# Patient Record
Sex: Female | Born: 1950
Health system: Southern US, Community
[De-identification: ages and names within clinical notes are randomized; demographics above are authoritative.]

## PROBLEM LIST (undated history)

## (undated) DIAGNOSIS — I7121 Aneurysm of the ascending aorta, without rupture: Secondary | ICD-10-CM

## (undated) DIAGNOSIS — F329 Major depressive disorder, single episode, unspecified: Secondary | ICD-10-CM

## (undated) DIAGNOSIS — I700 Atherosclerosis of aorta: Secondary | ICD-10-CM

## (undated) DIAGNOSIS — K259 Gastric ulcer, unspecified as acute or chronic, without hemorrhage or perforation: Secondary | ICD-10-CM

## (undated) DIAGNOSIS — M1990 Unspecified osteoarthritis, unspecified site: Secondary | ICD-10-CM

## (undated) DIAGNOSIS — K219 Gastro-esophageal reflux disease without esophagitis: Secondary | ICD-10-CM

## (undated) DIAGNOSIS — F419 Anxiety disorder, unspecified: Secondary | ICD-10-CM

## (undated) DIAGNOSIS — E785 Hyperlipidemia, unspecified: Secondary | ICD-10-CM

## (undated) DIAGNOSIS — M797 Fibromyalgia: Secondary | ICD-10-CM

## (undated) DIAGNOSIS — W57XXXA Bitten or stung by nonvenomous insect and other nonvenomous arthropods, initial encounter: Secondary | ICD-10-CM

## (undated) DIAGNOSIS — K623 Rectal prolapse: Secondary | ICD-10-CM

## (undated) DIAGNOSIS — D649 Anemia, unspecified: Secondary | ICD-10-CM

## (undated) DIAGNOSIS — T7840XA Allergy, unspecified, initial encounter: Secondary | ICD-10-CM

## (undated) DIAGNOSIS — Z800 Family history of malignant neoplasm of digestive organs: Secondary | ICD-10-CM

## (undated) DIAGNOSIS — K589 Irritable bowel syndrome without diarrhea: Secondary | ICD-10-CM

## (undated) DIAGNOSIS — F32A Depression, unspecified: Secondary | ICD-10-CM

## (undated) DIAGNOSIS — G4733 Obstructive sleep apnea (adult) (pediatric): Secondary | ICD-10-CM

## (undated) DIAGNOSIS — H269 Unspecified cataract: Secondary | ICD-10-CM

## (undated) HISTORY — DX: Depression, unspecified: F32.A

## (undated) HISTORY — DX: Unspecified cataract: H26.9

## (undated) HISTORY — DX: Gastro-esophageal reflux disease without esophagitis: K21.9

## (undated) HISTORY — DX: Irritable bowel syndrome, unspecified: K58.9

## (undated) HISTORY — DX: Anemia, unspecified: D64.9

## (undated) HISTORY — PX: LIPOMA EXCISION: SHX5283

## (undated) HISTORY — DX: Allergy, unspecified, initial encounter: T78.40XA

## (undated) HISTORY — DX: Major depressive disorder, single episode, unspecified: F32.9

## (undated) HISTORY — PX: OTHER SURGICAL HISTORY: SHX169

## (undated) HISTORY — PX: TONSILLECTOMY: SUR1361

## (undated) HISTORY — DX: Bitten or stung by nonvenomous insect and other nonvenomous arthropods, initial encounter: W57.XXXA

## (undated) HISTORY — DX: Hyperlipidemia, unspecified: E78.5

## (undated) HISTORY — PX: CHOLECYSTECTOMY: SHX55

## (undated) HISTORY — DX: Anxiety disorder, unspecified: F41.9

## (undated) HISTORY — DX: Unspecified osteoarthritis, unspecified site: M19.90

## (undated) HISTORY — DX: Gastric ulcer, unspecified as acute or chronic, without hemorrhage or perforation: K25.9

## (undated) HISTORY — DX: Fibromyalgia: M79.7

## (undated) HISTORY — DX: Obstructive sleep apnea (adult) (pediatric): G47.33

## (undated) HISTORY — DX: Atherosclerosis of aorta: I70.0

## (undated) HISTORY — DX: Aneurysm of the ascending aorta, without rupture: I71.21

## (undated) HISTORY — DX: Rectal prolapse: K62.3

## (undated) HISTORY — DX: Family history of malignant neoplasm of digestive organs: Z80.0

## (undated) HISTORY — PX: APPENDECTOMY: SHX54

---

## 1998-06-25 ENCOUNTER — Other Ambulatory Visit: Admission: RE | Admit: 1998-06-25 | Discharge: 1998-06-25 | Payer: Self-pay | Admitting: Obstetrics and Gynecology

## 1998-10-01 ENCOUNTER — Ambulatory Visit: Admission: RE | Admit: 1998-10-01 | Discharge: 1998-10-01 | Payer: Self-pay | Admitting: Unknown Physician Specialty

## 1999-09-28 ENCOUNTER — Other Ambulatory Visit: Admission: RE | Admit: 1999-09-28 | Discharge: 1999-09-28 | Payer: Self-pay | Admitting: Obstetrics and Gynecology

## 2000-07-05 ENCOUNTER — Other Ambulatory Visit: Admission: RE | Admit: 2000-07-05 | Discharge: 2000-07-05 | Payer: Self-pay | Admitting: Gynecology

## 2000-07-05 ENCOUNTER — Encounter (INDEPENDENT_AMBULATORY_CARE_PROVIDER_SITE_OTHER): Payer: Self-pay

## 2001-10-10 ENCOUNTER — Other Ambulatory Visit: Admission: RE | Admit: 2001-10-10 | Discharge: 2001-10-10 | Payer: Self-pay | Admitting: Obstetrics and Gynecology

## 2002-05-14 ENCOUNTER — Encounter: Payer: Self-pay | Admitting: Obstetrics and Gynecology

## 2002-05-14 ENCOUNTER — Encounter: Admission: RE | Admit: 2002-05-14 | Discharge: 2002-05-14 | Payer: Self-pay | Admitting: Obstetrics and Gynecology

## 2002-11-25 ENCOUNTER — Other Ambulatory Visit: Admission: RE | Admit: 2002-11-25 | Discharge: 2002-11-25 | Payer: Self-pay | Admitting: Obstetrics and Gynecology

## 2003-01-14 ENCOUNTER — Encounter: Admission: RE | Admit: 2003-01-14 | Discharge: 2003-01-14 | Payer: Self-pay | Admitting: Obstetrics and Gynecology

## 2003-01-14 ENCOUNTER — Encounter: Payer: Self-pay | Admitting: Obstetrics and Gynecology

## 2004-04-28 ENCOUNTER — Other Ambulatory Visit: Admission: RE | Admit: 2004-04-28 | Discharge: 2004-04-28 | Payer: Self-pay | Admitting: Obstetrics and Gynecology

## 2005-08-30 ENCOUNTER — Other Ambulatory Visit: Admission: RE | Admit: 2005-08-30 | Discharge: 2005-08-30 | Payer: Self-pay | Admitting: Obstetrics and Gynecology

## 2007-09-17 ENCOUNTER — Encounter: Admission: RE | Admit: 2007-09-17 | Discharge: 2007-09-17 | Payer: Self-pay | Admitting: Specialist

## 2011-09-13 DIAGNOSIS — IMO0002 Reserved for concepts with insufficient information to code with codable children: Secondary | ICD-10-CM | POA: Insufficient documentation

## 2011-09-13 DIAGNOSIS — H5200 Hypermetropia, unspecified eye: Secondary | ICD-10-CM | POA: Insufficient documentation

## 2014-04-18 HISTORY — PX: COLONOSCOPY: SHX174

## 2015-06-22 ENCOUNTER — Other Ambulatory Visit: Payer: Self-pay | Admitting: Internal Medicine

## 2015-06-22 DIAGNOSIS — R1030 Lower abdominal pain, unspecified: Secondary | ICD-10-CM

## 2015-06-22 DIAGNOSIS — N390 Urinary tract infection, site not specified: Secondary | ICD-10-CM

## 2016-01-05 DIAGNOSIS — E785 Hyperlipidemia, unspecified: Secondary | ICD-10-CM | POA: Diagnosis not present

## 2016-01-05 DIAGNOSIS — E559 Vitamin D deficiency, unspecified: Secondary | ICD-10-CM | POA: Diagnosis not present

## 2016-01-05 DIAGNOSIS — Z23 Encounter for immunization: Secondary | ICD-10-CM | POA: Diagnosis not present

## 2016-01-05 DIAGNOSIS — Z0001 Encounter for general adult medical examination with abnormal findings: Secondary | ICD-10-CM | POA: Diagnosis not present

## 2016-01-05 DIAGNOSIS — R252 Cramp and spasm: Secondary | ICD-10-CM | POA: Diagnosis not present

## 2016-01-05 DIAGNOSIS — E538 Deficiency of other specified B group vitamins: Secondary | ICD-10-CM | POA: Diagnosis not present

## 2016-01-05 DIAGNOSIS — N390 Urinary tract infection, site not specified: Secondary | ICD-10-CM | POA: Diagnosis not present

## 2016-01-05 DIAGNOSIS — D509 Iron deficiency anemia, unspecified: Secondary | ICD-10-CM | POA: Diagnosis not present

## 2016-04-11 DIAGNOSIS — E559 Vitamin D deficiency, unspecified: Secondary | ICD-10-CM | POA: Diagnosis not present

## 2016-04-11 DIAGNOSIS — E611 Iron deficiency: Secondary | ICD-10-CM | POA: Diagnosis not present

## 2016-04-14 HISTORY — PX: ESOPHAGOGASTRODUODENOSCOPY: SHX1529

## 2016-05-16 DIAGNOSIS — Z124 Encounter for screening for malignant neoplasm of cervix: Secondary | ICD-10-CM | POA: Diagnosis not present

## 2016-05-16 DIAGNOSIS — Z6835 Body mass index (BMI) 35.0-35.9, adult: Secondary | ICD-10-CM | POA: Diagnosis not present

## 2016-05-16 DIAGNOSIS — Z01419 Encounter for gynecological examination (general) (routine) without abnormal findings: Secondary | ICD-10-CM | POA: Diagnosis not present

## 2016-07-04 DIAGNOSIS — S90851A Superficial foreign body, right foot, initial encounter: Secondary | ICD-10-CM | POA: Diagnosis not present

## 2016-07-05 DIAGNOSIS — M795 Residual foreign body in soft tissue: Secondary | ICD-10-CM | POA: Diagnosis not present

## 2016-07-06 DIAGNOSIS — S91341A Puncture wound with foreign body, right foot, initial encounter: Secondary | ICD-10-CM | POA: Diagnosis not present

## 2016-07-06 DIAGNOSIS — M795 Residual foreign body in soft tissue: Secondary | ICD-10-CM | POA: Diagnosis not present

## 2016-07-06 DIAGNOSIS — S90851A Superficial foreign body, right foot, initial encounter: Secondary | ICD-10-CM | POA: Diagnosis not present

## 2016-07-06 DIAGNOSIS — G4733 Obstructive sleep apnea (adult) (pediatric): Secondary | ICD-10-CM | POA: Diagnosis not present

## 2016-07-27 DIAGNOSIS — Z1231 Encounter for screening mammogram for malignant neoplasm of breast: Secondary | ICD-10-CM | POA: Diagnosis not present

## 2017-12-03 ENCOUNTER — Ambulatory Visit (INDEPENDENT_AMBULATORY_CARE_PROVIDER_SITE_OTHER): Payer: Medicare Other

## 2017-12-03 ENCOUNTER — Ambulatory Visit: Payer: Medicare Other | Admitting: Podiatry

## 2017-12-03 ENCOUNTER — Encounter (INDEPENDENT_AMBULATORY_CARE_PROVIDER_SITE_OTHER): Payer: Self-pay

## 2017-12-03 ENCOUNTER — Other Ambulatory Visit: Payer: Self-pay | Admitting: Podiatry

## 2017-12-03 DIAGNOSIS — M2041 Other hammer toe(s) (acquired), right foot: Secondary | ICD-10-CM

## 2017-12-03 DIAGNOSIS — M79672 Pain in left foot: Secondary | ICD-10-CM | POA: Diagnosis not present

## 2017-12-03 DIAGNOSIS — S99922S Unspecified injury of left foot, sequela: Secondary | ICD-10-CM

## 2017-12-03 DIAGNOSIS — L989 Disorder of the skin and subcutaneous tissue, unspecified: Secondary | ICD-10-CM

## 2017-12-03 DIAGNOSIS — M79671 Pain in right foot: Secondary | ICD-10-CM | POA: Diagnosis not present

## 2017-12-03 DIAGNOSIS — S92502A Displaced unspecified fracture of left lesser toe(s), initial encounter for closed fracture: Secondary | ICD-10-CM

## 2017-12-03 DIAGNOSIS — Q828 Other specified congenital malformations of skin: Secondary | ICD-10-CM | POA: Diagnosis not present

## 2017-12-03 DIAGNOSIS — L923 Foreign body granuloma of the skin and subcutaneous tissue: Secondary | ICD-10-CM

## 2017-12-03 DIAGNOSIS — M7750 Other enthesopathy of unspecified foot: Secondary | ICD-10-CM | POA: Diagnosis not present

## 2017-12-03 DIAGNOSIS — M779 Enthesopathy, unspecified: Secondary | ICD-10-CM

## 2017-12-03 DIAGNOSIS — D2371 Other benign neoplasm of skin of right lower limb, including hip: Secondary | ICD-10-CM

## 2017-12-03 DIAGNOSIS — M2042 Other hammer toe(s) (acquired), left foot: Secondary | ICD-10-CM

## 2017-12-03 NOTE — Progress Notes (Signed)
   Subjective:    Patient ID: Patricia Leonard, female    DOB: 1951-09-20, 67 y.o.   MRN: [id number]   Chief Complaint  Patient presents with  . foreign object    R plantar (stepped on glass) x 1 year Pt. stated," it's still bothering me. I had surgery 1 year ago to remove the glass and it feels like I still have somthing there."  . Toe Injury    L 2nd toe (dropped a heavy lid) x 3 weeks; 10/10 sharp pain Tx: icing, and epsom salt soaking.    HPI  67 y.o. female presents with the above complaint.  Reports that she stepped on glass about 1 year ago.  States that Dr. Laurance Flatten had attempted to get out of the office and then taken to the operating room has still had pain at this area and feels like there is still a hard area that remains.  Also complains of left second toe injury states that she dropped a lid on her toe 3 weeks ago reports 10 out of 10 sharp pain and swelling.  Has tried icing and Epsom salt.  Did go to an urgent care, x-rays were performed but patient was told she did not fracture her toe  No past medical history on file.   Current Outpatient Medications:  .  DULoxetine (CYMBALTA) 20 MG capsule, Take 20 mg by mouth daily., Disp: , Rfl:   Allergies  Allergen Reactions  . Iohexol      Desc: HAD FACIAL RASH WITH CT CONTRAST IN 2006     Review of Systems  All other systems reviewed and are negative.     Objective:   Physical Exam There were no vitals filed for this visit. General AA&O x3. Normal mood and affect.  Vascular Dorsalis pedis and posterior tibial pulses  present 2+ bilaterally  Capillary refill normal to all digits. Pedal hair growth normal. Edema and contusion noted to the left middle and distal phalanges second toe  Neurologic Epicritic sensation grossly present.  Dermatologic No open lesions. Interspaces clear of maceration. Nails well groomed and normal in appearance. Punctate hyperkeratotic lesion midfoot right  Orthopedic: MMT 5/5 in dorsiflexion,  plantarflexion, inversion, and eversion. Normal joint ROM without pain or crepitus. HAV deformity bilateral, lesser digital contractures bilateral crossover deformity noted left second toe with positive Lachman test.  Left second toe pain to palpation about the middle and distal phalanges      Assessment & Plan:  Patient was evaluated and treated all questions answered  Left second toe fracture -X-rays taken and reviewed likely second distal phalanx fracture. -Left second third toes buddy splinted together. -Plantar plate rest strapping applied  Punctate hyperkeratosis with history of trauma -Lesion debrided and destroyed with salinocaine  History of foreign body -No residual foreign body identified in right foot x-rays  Return in about 4 weeks (around 12/31/2017) for Toe fracture f/u, porokeratosis.

## 2017-12-31 ENCOUNTER — Ambulatory Visit: Payer: Medicare Other | Admitting: Podiatry

## 2018-02-14 ENCOUNTER — Encounter: Payer: Self-pay | Admitting: Gastroenterology

## 2018-03-15 ENCOUNTER — Ambulatory Visit (INDEPENDENT_AMBULATORY_CARE_PROVIDER_SITE_OTHER): Payer: Medicare Other | Admitting: Gastroenterology

## 2018-03-15 ENCOUNTER — Encounter: Payer: Self-pay | Admitting: Gastroenterology

## 2018-03-15 VITALS — BP 138/78 | HR 66 | Ht 68.0 in | Wt 223.0 lb

## 2018-03-15 DIAGNOSIS — Z800 Family history of malignant neoplasm of digestive organs: Secondary | ICD-10-CM

## 2018-03-15 DIAGNOSIS — R6881 Early satiety: Secondary | ICD-10-CM | POA: Diagnosis not present

## 2018-03-15 DIAGNOSIS — R1030 Lower abdominal pain, unspecified: Secondary | ICD-10-CM | POA: Diagnosis not present

## 2018-03-15 DIAGNOSIS — K219 Gastro-esophageal reflux disease without esophagitis: Secondary | ICD-10-CM | POA: Diagnosis not present

## 2018-03-15 MED ORDER — RABEPRAZOLE SODIUM 20 MG PO TBEC: 20.0000 mg | DELAYED_RELEASE_TABLET | Freq: Every day | ORAL | 11 refills | Status: DC

## 2018-03-15 MED ORDER — DICYCLOMINE HCL 10 MG PO CAPS: 10.0000 mg | ORAL_CAPSULE | Freq: Two times a day (BID) | ORAL | 3 refills | Status: DC

## 2018-03-15 MED ORDER — SOD PICOSULFATE-MAG OX-CIT ACD 10-3.5-12 MG-GM -GM/160ML PO SOLN: 1.0000 | Freq: Once | ORAL | 0 refills | Status: AC

## 2018-03-15 NOTE — Progress Notes (Signed)
IMPRESSION and PLAN:    #1. Lower abdominal pain #2.  FH colon cancer #3.  Epigastric pain with ? Early satiety. Has history of cholecystectomy. #4.  GERD with 3 cm hiatal hernia. #5.  IBS with alternating diarrhea and constipation.  Plan: -Trial of Bentyl 10mg  po bid. -Change omeprazole to Aciphex 20mg  once a day. -Proceed with EGD and colonoscopy for further evaluation. -Proceed with CT scan of the abdomen and pelvis if the above work-up is negative and continued problems. -Appt wth Dr Dian Queen (OB/GYN)-patient to make. To rule out any GYN problems. -If still with problems, would give her a trial of Librax or amitriptyline.      HPI:    Chief Complaint:   Patricia Leonard is a 67 y.o. female  With lower abdominal crampy pain associated with alternating diarrhea and constipation.  No melena or hematochezia.  She has history of colonic polyps and family history of colon cancer and would like to get colonoscopy performed.  She also has having early satiety, upper abdominal discomfort especially after eating with associated epigastric pain.  She is status post cholecystectomy.  According to the notes, she has been advised to get a repeat EGD performed.  No recent weight loss or loss of appetite.   Past GI procedures: -EGD 04/14/2016-3 cm hiatal hernia.  Negative small bowel biopsies for celiac.  EGD 11/30/2014: Gastric ulcers, hiatal hernia, negative biopsies -Colonoscopy 04/20/2014; mild diverticulosis, previous hemorrhoidectomy.  Otherwise normal colonoscopy -CT 03/26/2014: Small adrenal adenoma, otherwise normal.   Past Medical History:  Diagnosis Date  . Depression   . Family history of colon cancer   . Fibromyalgia   . GERD (gastroesophageal reflux disease)   . IBS (irritable bowel syndrome)     Current Outpatient Medications  Medication Sig Dispense Refill  . DULoxetine (CYMBALTA) 20 MG capsule Take 20 mg by mouth daily.    Marland Kitchen omeprazole (PRILOSEC OTC) 20  MG tablet Take 40 mg by mouth daily.     No current facility-administered medications for this visit.      Family History  Problem Relation Age of Onset  . Colon cancer Mother     Social History   Tobacco Use  . Smoking status: Never Smoker  . Smokeless tobacco: Never Used  Substance Use Topics  . Alcohol use: Never    Frequency: Never  . Drug use: Never    Allergies  Allergen Reactions  . Iohexol      Desc: HAD FACIAL RASH WITH CT CONTRAST IN 2006      Review of Systems: All systems reviewed and negative except where noted in HPI.    Physical Exam:     BP 138/78   Pulse 66   Ht 5\' 8"  (1.727 m)   Wt 223 lb (101.2 kg)   BMI 33.91 kg/m  @WEIGHTLAST3 @ GENERAL:  Alert, oriented, cooperative, not in acute distress. PSYCH: :Pleasant, normal mood and affect. HEENT:  conjunctiva pink, mucous membranes moist, neck supple without masses. No jaundice. CARDIAC:  S1 S2 normal. No murmers. PULM: Normal respiratory effort, lungs CTA bilaterally, no wheezing. ABDOMEN: Inspection: No visible peristalsis, no abnormal pulsations, skin normal.  Palpation/percussion: Soft, nontender, nondistended, no rigidity, no abnormal dullness to percussion, no hepatosplenomegaly and no palpable abdominal masses.  Auscultation: Normal bowel sounds, no abdominal bruits. Rectal exam: Deferred SKIN:  turgor, no lesions seen. Musculoskeletal:  Normal muscle tone, normal strength. NEURO: Alert and oriented x 3, no focal neurologic deficits.  Seen  in presence of Docia Chuck CMA    Eleazar Kimmey,MD 03/15/2018, 4:52 PM   CC Ernestene Kiel, MD

## 2018-03-15 NOTE — Patient Instructions (Addendum)
If you are age 69 or older, your body mass index should be between 23-30. Your Body mass index is 33.91 kg/m. If this is out of the aforementioned range listed, please consider follow up with your Primary Care Provider.  If you are age 33 or younger, your body mass index should be between 19-25. Your Body mass index is 33.91 kg/m. If this is out of the aformentioned range listed, please consider follow up with your Primary Care Provider.   We have sent the following medications to your pharmacy for you to pick up at your convenience: Aciphex 20 mg once daily Bentyl 10 mg two times daily Clenpiq  Please make an appointment with your gyn.   You have been scheduled for an endoscopy. Please follow written instructions given to you at your visit today. If you use inhalers (even only as needed), please bring them with you on the day of your procedure. Your physician has requested that you go to www.[email or web address].com and enter the access code given to you at your visit today. This web site gives a general overview about your procedure. However, you should still follow specific instructions given to you by our office regarding your preparation for the procedure.  You have been scheduled for a colonoscopy. Please follow written instructions given to you at your visit today.  Please pick up your prep supplies at the pharmacy within the next 1-3 days. If you use inhalers (even only as needed), please bring them with you on the day of your procedure. Your physician has requested that you go to www.[email or web address] and enter the access code given to you at your visit today. This web site gives a general overview about your procedure. However, you should still follow specific instructions given to you by our office regarding your preparation for the procedure.  Thank you,  Dr. Jackquline Denmark

## 2018-03-22 ENCOUNTER — Encounter: Payer: Self-pay | Admitting: Gastroenterology

## 2018-04-03 ENCOUNTER — Encounter: Payer: Self-pay | Admitting: Gastroenterology

## 2018-04-03 ENCOUNTER — Ambulatory Visit (AMBULATORY_SURGERY_CENTER): Payer: Medicare Other | Admitting: Gastroenterology

## 2018-04-03 ENCOUNTER — Other Ambulatory Visit: Payer: Self-pay

## 2018-04-03 VITALS — BP 126/72 | HR 58 | Temp 99.1°F | Resp 14 | Ht 68.0 in | Wt 223.0 lb

## 2018-04-03 DIAGNOSIS — K2970 Gastritis, unspecified, without bleeding: Secondary | ICD-10-CM

## 2018-04-03 DIAGNOSIS — D122 Benign neoplasm of ascending colon: Secondary | ICD-10-CM

## 2018-04-03 DIAGNOSIS — Z800 Family history of malignant neoplasm of digestive organs: Secondary | ICD-10-CM

## 2018-04-03 DIAGNOSIS — R6881 Early satiety: Secondary | ICD-10-CM | POA: Diagnosis present

## 2018-04-03 DIAGNOSIS — R197 Diarrhea, unspecified: Secondary | ICD-10-CM | POA: Diagnosis not present

## 2018-04-03 MED ORDER — SODIUM CHLORIDE 0.9 % IV SOLN: 500.0000 mL | INTRAVENOUS | Status: DC

## 2018-04-03 NOTE — Op Note (Signed)
[facility] Patient Name: Patricia Leonard Procedure Date: 04/03/2018 12:56 PM MRN: [id number] Endoscopist: Jackquline Denmark , MD Age: 67 Referring MD:  Date of Birth: 1951-06-30 Gender: Female Account #: [id number] Procedure:                Upper GI endoscopy Indications:              Epigastric abdominal pain, early satiety Medicines:                Monitored Anesthesia Care Procedure:                Pre-Anesthesia Assessment:                           - Prior to the procedure, a History and Physical                            was performed, and patient medications and                            allergies were reviewed. The patient is competent.                            The risks and benefits of the procedure and the                            sedation options and risks were discussed with the                            patient. All questions were answered and informed                            consent was obtained. Patient identification and                            proposed procedure were verified by the physician                            in the procedure room. Mental Status Examination:                            alert and oriented. Prophylactic Antibiotics: The                            patient does not require prophylactic antibiotics.                            Prior Anticoagulants: The patient has taken no                            previous anticoagulant or antiplatelet agents. ASA                            Grade Assessment: II - A patient with mild systemic  disease. After reviewing the risks and benefits,                            the patient was deemed in satisfactory condition to                            undergo the procedure. The anesthesia plan was to                            use monitored anesthesia care (MAC). Immediately                            prior to administration of medications, the patient     was re-assessed for adequacy to receive sedatives.                            The heart rate, respiratory rate, oxygen                            saturations, blood pressure, adequacy of pulmonary                            ventilation, and response to care were monitored                            throughout the procedure. The physical status of                            the patient was re-assessed after the procedure.                           After obtaining informed consent, the endoscope was                            passed under direct vision. Throughout the                            procedure, the patient's blood pressure, pulse, and                            oxygen saturations were monitored continuously. The                            Model GIF-[id number]) scope was introduced                            through the mouth, and advanced to the second part                            of duodenum. The upper GI endoscopy was                            accomplished without difficulty. The patient  tolerated the procedure well. Scope In: Scope Out: Findings:                 A 3 cm hiatal hernia was present.                           Localized mild inflammation characterized by                            erythema was found in the gastric antrum. Biopsies                            were taken with a cold forceps for histology.                           The exam was otherwise without abnormality. Complications:            No immediate complications. Estimated Blood Loss:     Estimated blood loss: none. Impression:               - 3 cm hiatal hernia.                           - Mild gastritis.                           - The examination was otherwise normal. Recommendation:           - Patient has a contact number available for                            emergencies. The signs and symptoms of potential                            delayed complications  were discussed with the                            patient. Return to normal activities tomorrow.                            Written discharge instructions were provided to the                            patient.                           - Resume previous diet.                           - Continue present medications.                           - Await pathology results.                           - Return to GI clinic PRN. If still with problems,  will perform further workup. Jackquline Denmark, MD 04/03/2018 2:22:00 PM This report has been signed electronically.

## 2018-04-03 NOTE — Progress Notes (Signed)
Report to RN, VSS, adequate respirations noted, no c/o pain or discomfort 

## 2018-04-03 NOTE — Patient Instructions (Signed)
Discharge instructions given. Handouts on polyps,diverticulosis,hemorrhoids,hiatal hernia and gastritis. Resume previous medications. YOU HAD AN ENDOSCOPIC PROCEDURE TODAY AT [facility]:   Refer to the procedure report that was given to you for any specific questions about what was found during the examination.  If the procedure report does not answer your questions, please call your gastroenterologist to clarify.  If you requested that your care partner not be given the details of your procedure findings, then the procedure report has been included in a sealed envelope for you to review at your convenience later.  YOU SHOULD EXPECT: Some feelings of bloating in the abdomen. Passage of more gas than usual.  Walking can help get rid of the air that was put into your GI tract during the procedure and reduce the bloating. If you had a lower endoscopy (such as a colonoscopy or flexible sigmoidoscopy) you may notice spotting of blood in your stool or on the toilet paper. If you underwent a bowel prep for your procedure, you may not have a normal bowel movement for a few days.  Please Note:  You might notice some irritation and congestion in your nose or some drainage.  This is from the oxygen used during your procedure.  There is no need for concern and it should clear up in a day or so.  SYMPTOMS TO REPORT IMMEDIATELY:   Following lower endoscopy (colonoscopy or flexible sigmoidoscopy):  Excessive amounts of blood in the stool  Significant tenderness or worsening of abdominal pains  Swelling of the abdomen that is new, acute  Fever of 100F or higher   Following upper endoscopy (EGD)  Vomiting of blood or coffee ground material  New chest pain or pain under the shoulder blades  Painful or persistently difficult swallowing  New shortness of breath  Fever of 100F or higher  Black, tarry-looking stools  For urgent or emergent issues, a gastroenterologist can be reached at any  hour by calling [phone].   DIET:  We do recommend a small meal at first, but then you may proceed to your regular diet.  Drink plenty of fluids but you should avoid alcoholic beverages for 24 hours.  ACTIVITY:  You should plan to take it easy for the rest of today and you should NOT DRIVE or use heavy machinery until tomorrow (because of the sedation medicines used during the test).    FOLLOW UP: Our staff will call the number listed on your records the next business day following your procedure to check on you and address any questions or concerns that you may have regarding the information given to you following your procedure. If we do not reach you, we will leave a message.  However, if you are feeling well and you are not experiencing any problems, there is no need to return our call.  We will assume that you have returned to your regular daily activities without incident.  If any biopsies were taken you will be contacted by phone or by letter within the next 1-3 weeks.  Please call us at [phone] if you have not heard about the biopsies in 3 weeks.    SIGNATURES/CONFIDENTIALITY: You and/or your care partner have signed paperwork which will be entered into your electronic medical record.  These signatures attest to the fact that that the information above on your After Visit Summary has been reviewed and is understood.  Full responsibility of the confidentiality of this discharge information lies with you and/or your care-partner.

## 2018-04-03 NOTE — Progress Notes (Signed)
Patient with recent onset of "floater" in eye. Patient has contacted her eye doctor and has an appointment next week.

## 2018-04-03 NOTE — Op Note (Signed)
[facility] Patient Name: Patricia Leonard Procedure Date: 04/03/2018 12:56 PM MRN: [id number] Endoscopist: Jackquline Denmark , MD Age: 67 Referring MD:  Date of Birth: 07/30/1951 Gender: Female Account #: [id number] Procedure:                Colonoscopy Indications:              Screening in patient at increased risk: Family                            history of 1st-degree relative with colon cancer.                            H/O Diarrhea. Medicines:                Monitored Anesthesia Care Procedure:                Pre-Anesthesia Assessment:                           - Prior to the procedure, a History and Physical                            was performed, and patient medications and                            allergies were reviewed. The patient is competent.                            The risks and benefits of the procedure and the                            sedation options and risks were discussed with the                            patient. All questions were answered and informed                            consent was obtained. Patient identification and                            proposed procedure were verified by the physician                            in the procedure room. Mental Status Examination:                            alert and oriented. Prophylactic Antibiotics: The                            patient does not require prophylactic antibiotics.                            Prior Anticoagulants: The patient has taken no  previous anticoagulant or antiplatelet agents. ASA                            Grade Assessment: II - A patient with mild systemic                            disease. After reviewing the risks and benefits,                            the patient was deemed in satisfactory condition to                            undergo the procedure. The anesthesia plan was to                            use monitored anesthesia care  (MAC). Immediately                            prior to administration of medications, the patient                            was re-assessed for adequacy to receive sedatives.                            The heart rate, respiratory rate, oxygen                            saturations, blood pressure, adequacy of pulmonary                            ventilation, and response to care were monitored                            throughout the procedure. The physical status of                            the patient was re-assessed after the procedure.                           After obtaining informed consent, the colonoscope                            was passed under direct vision. Throughout the                            procedure, the patient's blood pressure, pulse, and                            oxygen saturations were monitored continuously. The                            Model PCF-[id number]) scope was introduced  through the anus and advanced to the 2 cm into the                            ileum. The colonoscopy was somewhat difficult due                            to a tortuous colon. Successful completion of the                            procedure was aided by applying abdominal pressure.                            The quality of the bowel preparation was good. Scope In: 1:51:35 PM Scope Out: 2:15:30 PM Scope Withdrawal Time: 0 hours 11 minutes 5 seconds  Total Procedure Duration: 0 hours 23 minutes 55 seconds  Findings:                 A 6 mm polyp was found in the proximal ascending                            colon. The polyp was sessile. The polyp was removed                            with a cold snare. Resection and retrieval were                            complete.                           Multiple small-mouthed diverticula were found in                            the sigmoid colon and descending colon. Biopsies                            for  histology were taken with a cold forceps from                            the entire colon for evaluation of microscopic                            colitis.                           Non-bleeding internal hemorrhoids were found during                            retroflexion. The hemorrhoids were small.                           The exam was otherwise without abnormality on                            direct and retroflexion views. Normal TI. Complications:  No immediate complications. Estimated Blood Loss:     Estimated blood loss: none. Impression:               - Colonic polyp status post polypectomy.                           - Moderate left colonic diverticulosis                           - Small internal hemorrhoids. Recommendation:           - Patient has a contact number available for                            emergencies. The signs and symptoms of potential                            delayed complications were discussed with the                            patient. Return to normal activities tomorrow.                            Written discharge instructions were provided to the                            patient.                           - High fiber diet.                           - Continue present medications.                           - Await pathology results.                           - Repeat colonoscopy for surveillance based on                            pathology results.                           - Return to GI clinic PRN. Jackquline Denmark, MD 04/03/2018 2:29:24 PM This report has been signed electronically.

## 2018-04-03 NOTE — Progress Notes (Signed)
Called to room to assist during endoscopic procedure.  Patient ID and intended procedure confirmed with present staff. Received instructions for my participation in the procedure from the performing physician.  

## 2018-04-04 ENCOUNTER — Telehealth: Payer: Self-pay

## 2018-04-04 NOTE — Telephone Encounter (Signed)
  Follow up Call-  Call back number 04/03/2018  Post procedure Call Back phone  # [phone]  Permission to leave phone message Yes  Some recent data might be hidden     Patient questions:  Do you have a fever, pain , or abdominal swelling? No. Pain Score  0 *  Have you tolerated food without any problems? Yes.    Have you been able to return to your normal activities? Yes.    Do you have any questions about your discharge instructions: Diet   No. Medications  No. Follow up visit  No.  Do you have questions or concerns about your Care? No.  Actions: * If pain score is 4 or above: No action needed, pain <4.  Pt. States that she had some increased abdominal pressure late yesterday, but figures that she ate too much and didn't pass a lot of gas.  States she feels better this am.

## 2018-04-07 ENCOUNTER — Encounter: Payer: Self-pay | Admitting: Gastroenterology

## 2018-10-08 DIAGNOSIS — E782 Mixed hyperlipidemia: Secondary | ICD-10-CM | POA: Diagnosis not present

## 2018-10-08 DIAGNOSIS — D508 Other iron deficiency anemias: Secondary | ICD-10-CM | POA: Diagnosis not present

## 2018-10-08 DIAGNOSIS — J208 Acute bronchitis due to other specified organisms: Secondary | ICD-10-CM | POA: Diagnosis not present

## 2018-10-08 DIAGNOSIS — J018 Other acute sinusitis: Secondary | ICD-10-CM | POA: Diagnosis not present

## 2018-10-08 DIAGNOSIS — H6123 Impacted cerumen, bilateral: Secondary | ICD-10-CM | POA: Diagnosis not present

## 2018-10-23 DIAGNOSIS — E782 Mixed hyperlipidemia: Secondary | ICD-10-CM | POA: Diagnosis not present

## 2018-10-23 DIAGNOSIS — F411 Generalized anxiety disorder: Secondary | ICD-10-CM | POA: Diagnosis not present

## 2018-10-23 DIAGNOSIS — Z23 Encounter for immunization: Secondary | ICD-10-CM | POA: Diagnosis not present

## 2018-10-23 DIAGNOSIS — D508 Other iron deficiency anemias: Secondary | ICD-10-CM | POA: Diagnosis not present

## 2018-10-29 DIAGNOSIS — Z1231 Encounter for screening mammogram for malignant neoplasm of breast: Secondary | ICD-10-CM | POA: Diagnosis not present

## 2019-02-03 DIAGNOSIS — B028 Zoster with other complications: Secondary | ICD-10-CM | POA: Diagnosis not present

## 2019-02-03 DIAGNOSIS — L304 Erythema intertrigo: Secondary | ICD-10-CM | POA: Diagnosis not present

## 2019-02-19 DIAGNOSIS — M9903 Segmental and somatic dysfunction of lumbar region: Secondary | ICD-10-CM | POA: Diagnosis not present

## 2019-02-19 DIAGNOSIS — S39012A Strain of muscle, fascia and tendon of lower back, initial encounter: Secondary | ICD-10-CM | POA: Diagnosis not present

## 2019-02-19 DIAGNOSIS — M9905 Segmental and somatic dysfunction of pelvic region: Secondary | ICD-10-CM | POA: Diagnosis not present

## 2019-02-19 DIAGNOSIS — M5136 Other intervertebral disc degeneration, lumbar region: Secondary | ICD-10-CM | POA: Diagnosis not present

## 2019-02-19 DIAGNOSIS — S336XXA Sprain of sacroiliac joint, initial encounter: Secondary | ICD-10-CM | POA: Diagnosis not present

## 2019-02-19 DIAGNOSIS — M9902 Segmental and somatic dysfunction of thoracic region: Secondary | ICD-10-CM | POA: Diagnosis not present

## 2019-02-20 DIAGNOSIS — S336XXA Sprain of sacroiliac joint, initial encounter: Secondary | ICD-10-CM | POA: Diagnosis not present

## 2019-02-20 DIAGNOSIS — M5136 Other intervertebral disc degeneration, lumbar region: Secondary | ICD-10-CM | POA: Diagnosis not present

## 2019-02-20 DIAGNOSIS — S39012A Strain of muscle, fascia and tendon of lower back, initial encounter: Secondary | ICD-10-CM | POA: Diagnosis not present

## 2019-02-20 DIAGNOSIS — M9902 Segmental and somatic dysfunction of thoracic region: Secondary | ICD-10-CM | POA: Diagnosis not present

## 2019-02-20 DIAGNOSIS — M9903 Segmental and somatic dysfunction of lumbar region: Secondary | ICD-10-CM | POA: Diagnosis not present

## 2019-02-20 DIAGNOSIS — M9905 Segmental and somatic dysfunction of pelvic region: Secondary | ICD-10-CM | POA: Diagnosis not present

## 2019-02-24 DIAGNOSIS — M9905 Segmental and somatic dysfunction of pelvic region: Secondary | ICD-10-CM | POA: Diagnosis not present

## 2019-02-24 DIAGNOSIS — M5136 Other intervertebral disc degeneration, lumbar region: Secondary | ICD-10-CM | POA: Diagnosis not present

## 2019-02-24 DIAGNOSIS — S39012A Strain of muscle, fascia and tendon of lower back, initial encounter: Secondary | ICD-10-CM | POA: Diagnosis not present

## 2019-02-24 DIAGNOSIS — M9903 Segmental and somatic dysfunction of lumbar region: Secondary | ICD-10-CM | POA: Diagnosis not present

## 2019-02-24 DIAGNOSIS — S336XXA Sprain of sacroiliac joint, initial encounter: Secondary | ICD-10-CM | POA: Diagnosis not present

## 2019-02-24 DIAGNOSIS — M9902 Segmental and somatic dysfunction of thoracic region: Secondary | ICD-10-CM | POA: Diagnosis not present

## 2019-04-23 DIAGNOSIS — F411 Generalized anxiety disorder: Secondary | ICD-10-CM | POA: Diagnosis not present

## 2019-04-23 DIAGNOSIS — K581 Irritable bowel syndrome with constipation: Secondary | ICD-10-CM | POA: Diagnosis not present

## 2019-04-23 DIAGNOSIS — R5383 Other fatigue: Secondary | ICD-10-CM | POA: Diagnosis not present

## 2019-04-23 DIAGNOSIS — M545 Low back pain: Secondary | ICD-10-CM | POA: Diagnosis not present

## 2019-04-23 DIAGNOSIS — E782 Mixed hyperlipidemia: Secondary | ICD-10-CM | POA: Diagnosis not present

## 2019-04-29 DIAGNOSIS — Z0000 Encounter for general adult medical examination without abnormal findings: Secondary | ICD-10-CM | POA: Diagnosis not present

## 2019-04-29 DIAGNOSIS — Z6837 Body mass index (BMI) 37.0-37.9, adult: Secondary | ICD-10-CM | POA: Diagnosis not present

## 2019-06-26 DIAGNOSIS — L814 Other melanin hyperpigmentation: Secondary | ICD-10-CM | POA: Diagnosis not present

## 2019-06-26 DIAGNOSIS — D485 Neoplasm of uncertain behavior of skin: Secondary | ICD-10-CM | POA: Diagnosis not present

## 2019-07-04 DIAGNOSIS — R05 Cough: Secondary | ICD-10-CM | POA: Diagnosis not present

## 2019-07-04 DIAGNOSIS — J018 Other acute sinusitis: Secondary | ICD-10-CM | POA: Diagnosis not present

## 2019-07-24 DIAGNOSIS — M7742 Metatarsalgia, left foot: Secondary | ICD-10-CM | POA: Diagnosis not present

## 2019-07-24 DIAGNOSIS — M79672 Pain in left foot: Secondary | ICD-10-CM | POA: Diagnosis not present

## 2019-07-24 DIAGNOSIS — M205X9 Other deformities of toe(s) (acquired), unspecified foot: Secondary | ICD-10-CM | POA: Diagnosis not present

## 2019-10-24 DIAGNOSIS — E782 Mixed hyperlipidemia: Secondary | ICD-10-CM | POA: Diagnosis not present

## 2019-10-24 DIAGNOSIS — K219 Gastro-esophageal reflux disease without esophagitis: Secondary | ICD-10-CM | POA: Diagnosis not present

## 2019-10-24 DIAGNOSIS — M545 Low back pain: Secondary | ICD-10-CM | POA: Diagnosis not present

## 2019-10-24 DIAGNOSIS — K581 Irritable bowel syndrome with constipation: Secondary | ICD-10-CM | POA: Diagnosis not present

## 2019-10-24 DIAGNOSIS — Z6838 Body mass index (BMI) 38.0-38.9, adult: Secondary | ICD-10-CM | POA: Diagnosis not present

## 2019-10-24 DIAGNOSIS — R0789 Other chest pain: Secondary | ICD-10-CM | POA: Diagnosis not present

## 2019-10-24 DIAGNOSIS — F411 Generalized anxiety disorder: Secondary | ICD-10-CM | POA: Diagnosis not present

## 2019-10-31 ENCOUNTER — Telehealth (INDEPENDENT_AMBULATORY_CARE_PROVIDER_SITE_OTHER): Payer: PPO | Admitting: Nurse Practitioner

## 2019-10-31 ENCOUNTER — Other Ambulatory Visit: Payer: Self-pay

## 2019-10-31 ENCOUNTER — Encounter: Payer: Self-pay | Admitting: Nurse Practitioner

## 2019-10-31 VITALS — Temp 98.6°F | Ht 66.0 in | Wt 237.4 lb

## 2019-10-31 DIAGNOSIS — U071 COVID-19: Secondary | ICD-10-CM | POA: Insufficient documentation

## 2019-10-31 LAB — POC COVID19 BINAXNOW: SARS Coronavirus 2 Ag: POSITIVE — AB

## 2019-10-31 NOTE — Patient Instructions (Signed)
COVID-19 COVID-19 is a respiratory infection that is caused by a virus called severe acute respiratory syndrome coronavirus 2 (SARS-CoV-2). The disease is also known as coronavirus disease or novel coronavirus. In some people, the virus may not cause any symptoms. In others, it may cause a serious infection. The infection can get worse quickly and can lead to complications, such as:  Pneumonia, or infection of the lungs.  Acute respiratory distress syndrome or ARDS. This is a condition in which fluid build-up in the lungs prevents the lungs from filling with air and passing oxygen into the blood.  Acute respiratory failure. This is a condition in which there is not enough oxygen passing from the lungs to the body or when carbon dioxide is not passing from the lungs out of the body.  Sepsis or septic shock. This is a serious bodily reaction to an infection.  Blood clotting problems.  Secondary infections due to bacteria or fungus.  Organ failure. This is when your body's organs stop working. The virus that causes COVID-19 is contagious. This means that it can spread from person to person through droplets from coughs and sneezes (respiratory secretions). What are the causes? This illness is caused by a virus. You may catch the virus by:  Breathing in droplets from an infected person. Droplets can be spread by a person breathing, speaking, singing, coughing, or sneezing.  Touching something, like a table or a doorknob, that was exposed to the virus (contaminated) and then touching your mouth, nose, or eyes. What increases the risk? Risk for infection You are more likely to be infected with this virus if you:  Are within 6 feet (2 meters) of a person with COVID-19.  Provide care for or live with a person who is infected with COVID-19.  Spend time in crowded indoor spaces or live in shared housing. Risk for serious illness You are more likely to become seriously ill from the virus if you:   Are 50 years of age or older. The higher your age, the more you are at risk for serious illness.  Live in a nursing home or long-term care facility.  Have cancer.  Have a long-term (chronic) disease such as: ? Chronic lung disease, including chronic obstructive pulmonary disease or asthma. ? A long-term disease that lowers your body's ability to fight infection (immunocompromised). ? Heart disease, including heart failure, a condition in which the arteries that lead to the heart become narrow or blocked (coronary artery disease), a disease which makes the heart muscle thick, weak, or stiff (cardiomyopathy). ? Diabetes. ? Chronic kidney disease. ? Sickle cell disease, a condition in which red blood cells have an abnormal "sickle" shape. ? Liver disease.  Are obese. What are the signs or symptoms? Symptoms of this condition can range from mild to severe. Symptoms may appear any time from 2 to 14 days after being exposed to the virus. They include:  A fever or chills.  A cough.  Difficulty breathing.  Headaches, body aches, or muscle aches.  Runny or stuffy (congested) nose.  A sore throat.  New loss of taste or smell. Some people may also have stomach problems, such as nausea, vomiting, or diarrhea. Other people may not have any symptoms of COVID-19. How is this diagnosed? This condition may be diagnosed based on:  Your signs and symptoms, especially if: ? You live in an area with a COVID-19 outbreak. ? You recently traveled to or from an area where the virus is common. ? You   provide care for or live with a person who was diagnosed with COVID-19. ? You were exposed to a person who was diagnosed with COVID-19.  A physical exam.  Lab tests, which may include: ? Taking a sample of fluid from the back of your nose and throat (nasopharyngeal fluid), your nose, or your throat using a swab. ? A sample of mucus from your lungs (sputum). ? Blood tests.  Imaging tests, which  may include, X-rays, CT scan, or ultrasound. How is this treated? At present, there is no medicine to treat COVID-19. Medicines that treat other diseases are being used on a trial basis to see if they are effective against COVID-19. Your health care provider will talk with you about ways to treat your symptoms. For most people, the infection is mild and can be managed at home with rest, fluids, and over-the-counter medicines. Treatment for a serious infection usually takes places in a hospital intensive care unit (ICU). It may include one or more of the following treatments. These treatments are given until your symptoms improve.  Receiving fluids and medicines through an IV.  Supplemental oxygen. Extra oxygen is given through a tube in the nose, a face mask, or a hood.  Positioning you to lie on your stomach (prone position). This makes it easier for oxygen to get into the lungs.  Continuous positive airway pressure (CPAP) or bi-level positive airway pressure (BPAP) machine. This treatment uses mild air pressure to keep the airways open. A tube that is connected to a motor delivers oxygen to the body.  Ventilator. This treatment moves air into and out of the lungs by using a tube that is placed in your windpipe.  Tracheostomy. This is a procedure to create a hole in the neck so that a breathing tube can be inserted.  Extracorporeal membrane oxygenation (ECMO). This procedure gives the lungs a chance to recover by taking over the functions of the heart and lungs. It supplies oxygen to the body and removes carbon dioxide. Follow these instructions at home: Lifestyle  If you are sick, stay home except to get medical care. Your health care provider will tell you how long to stay home. Call your health care provider before you go for medical care.  Rest at home as told by your health care provider.  Do not use any products that contain nicotine or tobacco, such as cigarettes, e-cigarettes, and  chewing tobacco. If you need help quitting, ask your health care provider.  Return to your normal activities as told by your health care provider. Ask your health care provider what activities are safe for you. General instructions  Take over-the-counter and prescription medicines only as told by your health care provider.  Drink enough fluid to keep your urine pale yellow.  Keep all follow-up visits as told by your health care provider. This is important. How is this prevented?  There is no vaccine to help prevent COVID-19 infection. However, there are steps you can take to protect yourself and others from this virus. To protect yourself:   Do not travel to areas where COVID-19 is a risk. The areas where COVID-19 is reported change often. To identify high-risk areas and travel restrictions, check the CDC travel website: wwwnc.cdc.gov/travel/notices  If you live in, or must travel to, an area where COVID-19 is a risk, take precautions to avoid infection. ? Stay away from people who are sick. ? Wash your hands often with soap and water for 20 seconds. If soap and water   are not available, use an alcohol-based hand sanitizer. ? Avoid touching your mouth, face, eyes, or nose. ? Avoid going out in public, follow guidance from your state and local health authorities. ? If you must go out in public, wear a cloth face covering or face mask. Make sure your mask covers your nose and mouth. ? Avoid crowded indoor spaces. Stay at least 6 feet (2 meters) away from others. ? Disinfect objects and surfaces that are frequently touched every day. This may include:  Counters and tables.  Doorknobs and light switches.  Sinks and faucets.  Electronics, such as phones, remote controls, keyboards, computers, and tablets. To protect others: If you have symptoms of COVID-19, take steps to prevent the virus from spreading to others.  If you think you have a COVID-19 infection, contact your health care  provider right away. Tell your health care team that you think you may have a COVID-19 infection.  Stay home. Leave your house only to seek medical care. Do not use public transport.  Do not travel while you are sick.  Wash your hands often with soap and water for 20 seconds. If soap and water are not available, use alcohol-based hand sanitizer.  Stay away from other members of your household. Let healthy household members care for children and pets, if possible. If you have to care for children or pets, wash your hands often and wear a mask. If possible, stay in your own room, separate from others. Use a different bathroom.  Make sure that all people in your household wash their hands well and often.  Cough or sneeze into a tissue or your sleeve or elbow. Do not cough or sneeze into your hand or into the air.  Wear a cloth face covering or face mask. Make sure your mask covers your nose and mouth. Where to find more information  Centers for Disease Control and Prevention: www.cdc.gov/coronavirus/2019-ncov/index.html  World Health Organization: www.who.int/health-topics/coronavirus Contact a health care provider if:  You live in or have traveled to an area where COVID-19 is a risk and you have symptoms of the infection.  You have had contact with someone who has COVID-19 and you have symptoms of the infection. Get help right away if:  You have trouble breathing.  You have pain or pressure in your chest.  You have confusion.  You have bluish lips and fingernails.  You have difficulty waking from sleep.  You have symptoms that get worse. These symptoms may represent a serious problem that is an emergency. Do not wait to see if the symptoms will go away. Get medical help right away. Call your local emergency services (911 in the [place] Do not drive yourself to the hospital. Let the emergency medical personnel know if you think you have COVID-19. Summary  COVID-19 is a  respiratory infection that is caused by a virus. It is also known as coronavirus disease or novel coronavirus. It can cause serious infections, such as pneumonia, acute respiratory distress syndrome, acute respiratory failure, or sepsis.  The virus that causes COVID-19 is contagious. This means that it can spread from person to person through droplets from breathing, speaking, singing, coughing, or sneezing.  You are more likely to develop a serious illness if you are 50 years of age or older, have a weak immune system, live in a nursing home, or have chronic disease.  There is no medicine to treat COVID-19. Your health care provider will talk with you about ways to treat your symptoms.    Take steps to protect yourself and others from infection. Wash your hands often and disinfect objects and surfaces that are frequently touched every day. Stay away from people who are sick and wear a mask if you are sick. This information is not intended to replace advice given to you by your health care provider. Make sure you discuss any questions you have with your health care provider. Document Revised: 07/11/2019 Document Reviewed: 10/17/2018 Elsevier Patient Education  2020 [employer]  

## 2019-10-31 NOTE — Progress Notes (Signed)
TELEHEALTH [facility]IRTUAL   I connected with Patricia Leonard on 11/02/19 at  9:15 AM EST by telephone at home and verified that I am speaking with the correct person using two identifiers.   I discussed the limitations, risks, security and privacy concerns of performing an evaluation and management service by telephone and the availability of in person appointments. I also discussed with the patient that there may be a patient responsible charge related to this service. The patient expressed understanding and agreed to proceed.   History:  Patricia Leonard is a 69 y.o. female being evaluated today for covid-19 exposure She denies fever but positive for muscle aches, headache, cough and nasal drainage .      Past Medical History:  Diagnosis Date  . Allergy   . Anemia   . Anxiety   . Arthritis   . Cataract   . Depression   . Family history of colon cancer   . Fibromyalgia   . GERD (gastroesophageal reflux disease)   . Hyperlipidemia   . IBS (irritable bowel syndrome)    Past Surgical History:  Procedure Laterality Date  . APPENDECTOMY    . CESAREAN SECTION    . CHOLECYSTECTOMY    . COLONOSCOPY  04/18/2014   Diverticulosis.   Marland Kitchen ESOPHAGOGASTRODUODENOSCOPY  04/14/2016   Schatzkis ring. Hiatal hernia. Gastritis.   Marland Kitchen LIPOMA EXCISION    . thermal ablation    . TONSILLECTOMY     The following portions of the patient's history were reviewed and updated as appropriate: allergies, current medications, past family history, past medical history, past social history, past surgical history and problem list.      Review of Systems:  Pertinent items noted in HPI. Review of Systems  Constitutional: Negative for chills and fever.  HENT: Negative for ear discharge, ear pain and sinus pain.   Eyes: Negative for pain.  Respiratory: Positive for cough.   Cardiovascular: Negative for chest pain and palpitations.  Gastrointestinal: Negative for abdominal pain, nausea and vomiting.  Musculoskeletal:  Positive for joint pain and myalgias.  Skin: Negative for rash.  Neurological: Positive for headaches. Negative for dizziness.    Physical Exam:   General:  Alert, oriented and cooperative.   Mental Status: Normal mood and affect perceived. Normal judgment and thought content.  Physical exam deferred due to nature of the encounter  Labs: covid-19 test  No results found.    Assessment and Plan:     There are no diagnoses linked to this encounter.     Patient will come to the office for a covid-19 test at 11.30am. I discussed the assessment and treatment plan with the patient. The patient was provided an opportunity to ask questions and all were answered. The patient agreed with the plan and demonstrated an understanding of the instructions.   The patient was advised to call back or seek an in-person evaluation/go to the ED if the symptoms worsen or if the condition fails to improve as anticipated.  I provided 10 minutes of non-face-to-face time during this encounter.   Ivy Lynn, NP

## 2019-10-31 NOTE — Assessment & Plan Note (Signed)
Rapid covid-19 test positive. Education provided to patient. Patient knows to go to ED with increase SOB and worsening symptoms

## 2019-12-09 DIAGNOSIS — Z03818 Encounter for observation for suspected exposure to other biological agents ruled out: Secondary | ICD-10-CM | POA: Diagnosis not present

## 2020-01-05 ENCOUNTER — Encounter: Payer: Self-pay | Admitting: Family Medicine

## 2020-01-06 DIAGNOSIS — Z1231 Encounter for screening mammogram for malignant neoplasm of breast: Secondary | ICD-10-CM | POA: Diagnosis not present

## 2020-01-19 ENCOUNTER — Other Ambulatory Visit (INDEPENDENT_AMBULATORY_CARE_PROVIDER_SITE_OTHER): Payer: PPO

## 2020-01-19 ENCOUNTER — Encounter: Payer: Self-pay | Admitting: Physician Assistant

## 2020-01-19 ENCOUNTER — Ambulatory Visit (INDEPENDENT_AMBULATORY_CARE_PROVIDER_SITE_OTHER): Payer: PPO | Admitting: Physician Assistant

## 2020-01-19 VITALS — BP 130/76 | HR 66 | Temp 97.4°F | Ht 66.0 in | Wt 236.8 lb

## 2020-01-19 DIAGNOSIS — R6881 Early satiety: Secondary | ICD-10-CM

## 2020-01-19 DIAGNOSIS — R1013 Epigastric pain: Secondary | ICD-10-CM

## 2020-01-19 DIAGNOSIS — Z86010 Personal history of colonic polyps: Secondary | ICD-10-CM | POA: Diagnosis not present

## 2020-01-19 DIAGNOSIS — R109 Unspecified abdominal pain: Secondary | ICD-10-CM

## 2020-01-19 DIAGNOSIS — Z8719 Personal history of other diseases of the digestive system: Secondary | ICD-10-CM

## 2020-01-19 LAB — COMPREHENSIVE METABOLIC PANEL
ALT: 18 U/L (ref 0–35)
AST: 19 U/L (ref 0–37)
Albumin: 4.2 g/dL (ref 3.5–5.2)
Alkaline Phosphatase: 77 U/L (ref 39–117)
BUN: 14 mg/dL (ref 6–23)
CO2: 30 mEq/L (ref 19–32)
Calcium: 9.6 mg/dL (ref 8.4–10.5)
Chloride: 103 mEq/L (ref 96–112)
Creatinine, Ser: 0.85 mg/dL (ref 0.40–1.20)
GFR: 65.95 mL/min (ref >60.00)
Glucose, Bld: 98 mg/dL (ref 70–99)
Potassium: 4.4 mEq/L (ref 3.5–5.1)
Sodium: 139 mEq/L (ref 135–145)
Total Bilirubin: 0.6 mg/dL (ref 0.2–1.2)
Total Protein: 7.1 g/dL (ref 6.0–8.3)

## 2020-01-19 LAB — CBC WITH DIFFERENTIAL/PLATELET
Basophils Absolute: 0 10*3/uL (ref 0.0–0.1)
Basophils Relative: 0.6 % (ref 0.0–3.0)
Eosinophils Absolute: 0.1 10*3/uL (ref 0.0–0.7)
Eosinophils Relative: 1.9 % (ref 0.0–5.0)
HCT: 42.3 % (ref 36.0–46.0)
Hemoglobin: 14.2 g/dL (ref 12.0–15.0)
Lymphocytes Relative: 26.6 % (ref 12.0–46.0)
Lymphs Abs: 1.3 10*3/uL (ref 0.7–4.0)
MCHC: 33.6 g/dL (ref 30.0–36.0)
MCV: 86.3 fl (ref 78.0–100.0)
Monocytes Absolute: 0.3 10*3/uL (ref 0.1–1.0)
Monocytes Relative: 6.3 % (ref 3.0–12.0)
Neutro Abs: 3.2 10*3/uL (ref 1.4–7.7)
Neutrophils Relative %: 64.6 % (ref 43.0–77.0)
Platelets: 199 10*3/uL (ref 150.0–400.0)
RBC: 4.9 Mil/uL (ref 3.87–5.11)
RDW: 13.9 % (ref 11.5–15.5)
WBC: 5 10*3/uL (ref 4.0–10.5)

## 2020-01-19 LAB — LIPASE: Lipase: 18 U/L (ref 11.0–59.0)

## 2020-01-19 LAB — SEDIMENTATION RATE: Sed Rate: 10 mm/hr (ref 0–30)

## 2020-01-19 MED ORDER — DICYCLOMINE HCL 10 MG PO CAPS: 10.0000 mg | ORAL_CAPSULE | Freq: Three times a day (TID) | ORAL | 8 refills | Status: AC

## 2020-01-19 MED ORDER — OMEPRAZOLE 40 MG PO CPDR: 40.0000 mg | DELAYED_RELEASE_CAPSULE | Freq: Every day | ORAL | 3 refills | Status: DC

## 2020-01-19 NOTE — Patient Instructions (Addendum)
If you are age 71 or older, your body mass index should be between 23-30. Your Body mass index is 38.22 kg/m. If this is out of the aforementioned range listed, please consider follow up with your Primary Care Provider.  If you are age 58 or younger, your body mass index should be between 19-25. Your Body mass index is 38.22 kg/m. If this is out of the aformentioned range listed, please consider follow up with your Primary Care Provider.   Your provider has requested that you go to the basement level for lab work before leaving today. Press "B" on the elevator. The lab is located at the first door on the left as you exit the elevator.  Due to recent changes in healthcare laws, you may see the results of your imaging and laboratory studies on MyChart before your provider has had a chance to review them.  We understand that in some cases there may be results that are confusing or concerning to you. Not all laboratory results come back in the same time frame and the provider may be waiting for multiple results in order to interpret others.  Please give [place] 48 hours in order for your provider to thoroughly review all the results before contacting the office for clarification of your results.   START Omeprazole 40 mg 1 capsule daily in the morning.  START Miralax 1 capful in 8 ounces of water or juice daily.  You have been scheduled for a CT scan of the abdomen and pelvis at [facility] ([address]---this is in the same building as [employer]).   You are scheduled on 4/30/1 at 1 pm . You should arrive 15 minutes prior to your appointment time for registration. Please follow the written instructions below on the day of your exam:  WARNING: IF YOU ARE ALLERGIC TO IODINE/X-RAY DYE, PLEASE NOTIFY RADIOLOGY IMMEDIATELY AT [phone]! YOU WILL BE GIVEN A 13 HOUR PREMEDICATION PREP.  1) Do not eat or drink anything after   9:00 am  (4 hours prior to your test) 2) You have been  given 2 bottles of oral contrast to drink. The solution may taste better if refrigerated, but do NOT add ice or any other liquid to this solution. Shake well before drinking.    Drink 1 bottle of contrast @    11:00 am   (2 hours prior to your exam)  Drink 1 bottle of contrast @   12:00 pm    (1 hour prior to your exam)  You may take any medications as prescribed with a small amount of water, if necessary. If you take any of the following medications: METFORMIN, GLUCOPHAGE, GLUCOVANCE, AVANDAMET, RIOMET, FORTAMET, [place] MET, JANUMET, GLUMETZA or METAGLIP, you MAY be asked to HOLD this medication 48 hours AFTER the exam.  The purpose of you drinking the oral contrast is to aid in the visualization of your intestinal tract. The contrast solution may cause some diarrhea. Depending on your individual set of symptoms, you may also receive an intravenous injection of x-ray contrast/dye. Plan on being at [facility] for 30 minutes or longer, depending on the type of exam you are having performed.  This test typically takes 30-45 minutes to complete.  If you have any questions regarding your exam or if you need to reschedule, you may call the CT department at [phone] between the hours of 8:00 am and 5:00 pm, Monday-Friday.  ________________________________________________________________________

## 2020-01-20 ENCOUNTER — Encounter: Payer: Self-pay | Admitting: Physician Assistant

## 2020-01-20 ENCOUNTER — Telehealth: Payer: Self-pay | Admitting: Physician Assistant

## 2020-01-20 DIAGNOSIS — Z8719 Personal history of other diseases of the digestive system: Secondary | ICD-10-CM | POA: Insufficient documentation

## 2020-01-20 DIAGNOSIS — Z86010 Personal history of colonic polyps: Secondary | ICD-10-CM | POA: Insufficient documentation

## 2020-01-20 DIAGNOSIS — K219 Gastro-esophageal reflux disease without esophagitis: Secondary | ICD-10-CM | POA: Insufficient documentation

## 2020-01-20 NOTE — Progress Notes (Signed)
Subjective:    Patient ID: Patricia Leonard, female    DOB: 03/01/1951, 69 y.o.   MRN: [id number]  HPI Patricia Leonard is a 69 year old white female, established with Dr. Lyndel Leonard, who comes in today with complaints of abdominal pain. She was last seen in 2019 and had undergone upper endoscopy in July 2019 which showed a 3 cm hiatal hernia, gastric biopsies were negative for H. pylori.  Colonoscopy was also done at that same time with finding of multiple diverticuli, nonbleeding internal hemorrhoids and she had one 6 mm polyp removed from the ascending colon which was a tubular adenoma.  Indicated for 5-year interval follow-up. Patient does have family history of colon cancer, she is status post cholecystectomy and has prior history of peptic ulcer disease. She unfortunately had COVID-19 in February 2021, she feels she has recovered from that but still having an occasional "Covid headache". Patient says she has been under a lot of stress over the past few years.  She had been primary caregiver for her husband who passed away in 10/24/19. Patient says she has had issues with IBS in the past, primarily with some abdominal cramping and bloating but says that her symptoms have been "unbearable" over the past few months.  She is getting pain at times that goes into her back and shoulder blades.  She also endorses early satiety though no weight loss.  She has not had any nausea or vomiting.  She says her abdominal discomfort/pain has been pretty constant over the past 3 months and located in the mid to lower abdomen and also in the right upper abdomen.  No significant changes in her bowel habits she tends to have alternating bowel habits, more towards the constipated side.  She did have an episode of impaction about 2 years ago. She has been on low-dose omeprazole chronically, and has been using Bentyl 10 mg 3-4 times daily but without much relief in current symptoms. No regular NSAID use.  Review of Systems  Pertinent positive and negative review of systems were noted in the above HPI section.  All other review of systems was otherwise negative.  Outpatient Encounter Medications as of 01/19/2020  Medication Sig  . dicyclomine (BENTYL) 10 MG capsule Take 1 capsule (10 mg total) by mouth 3 (three) times daily before meals. As needed  . omeprazole (PRILOSEC OTC) 20 MG tablet Take 40 mg by mouth daily.  . [DISCONTINUED] dicyclomine (BENTYL) 10 MG capsule Take 1 capsule (10 mg total) by mouth 2 (two) times daily.  Marland Kitchen LINZESS 290 MCG CAPS capsule PLEASE SEE ATTACHED FOR DETAILED DIRECTIONS  . omeprazole (PRILOSEC) 40 MG capsule Take 1 capsule (40 mg total) by mouth daily.  . RABEprazole (ACIPHEX) 20 MG tablet Take 1 tablet (20 mg total) by mouth daily. (Patient not taking: Reported on 01/19/2020)  . [DISCONTINUED] tiZANidine (ZANAFLEX) 2 MG tablet tizanidine 2 mg tablet   Facility-Administered Encounter Medications as of 01/19/2020  Medication  . 0.9 %  sodium chloride infusion   Allergies  Allergen Reactions  . Amoxicillin-Pot Clavulanate   . Iohexol      Desc: HAD FACIAL RASH WITH CT CONTRAST IN 2006    Patient Active Problem List   Diagnosis Date Noted  . GERD (gastroesophageal reflux disease) 01/20/2020  . History of IBS 01/20/2020  . Hx of adenomatous colonic polyps 01/20/2020  . COVID-19 10/31/2019  . Nuclear cataract 09/13/2011  . Hyperopia with astigmatism 09/13/2011   Social History   Socioeconomic History  .  Marital status: Married    Spouse name: Not on file  . Number of children: Not on file  . Years of education: Not on file  . Highest education level: Not on file  Occupational History  . Not on file  Tobacco Use  . Smoking status: Never Smoker  . Smokeless tobacco: Never Used  Substance and Sexual Activity  . Alcohol use: Never  . Drug use: Never  . Sexual activity: Not on file  Other Topics Concern  . Not on file  Social History Narrative  . Not on file   Social  Determinants of Health   Financial Resource Strain:   . Difficulty of Paying Living Expenses:   Food Insecurity:   . Worried About Charity fundraiser in the Last Year:   . Arboriculturist in the Last Year:   Transportation Needs:   . Film/video editor (Medical):   Marland Kitchen Lack of Transportation (Non-Medical):   Physical Activity:   . Days of Exercise per Week:   . Minutes of Exercise per Session:   Stress:   . Feeling of Stress :   Social Connections:   . Frequency of Communication with Friends and Family:   . Frequency of Social Gatherings with Friends and Family:   . Attends Religious Services:   . Active Member of Clubs or Organizations:   . Attends Archivist Meetings:   Marland Kitchen Marital Status:   Intimate Partner Violence:   . Fear of Current or Ex-Partner:   . Emotionally Abused:   Marland Kitchen Physically Abused:   . Sexually Abused:     Ms. Patricia Leonard's family history includes Colon cancer in her mother; Liver cancer in her father.      Objective:    Vitals:   01/19/20 1132  BP: 130/76  Pulse: 66  Temp: (!) 97.4 F (36.3 C)    Physical Exam Well-developed well-nourished older white female in no acute distress.  Height, Weight, 236 BMI 38.2  HEENT; nontraumatic normocephalic, EOMI, PER R LA, sclera anicteric. Oropharynx; not examined Neck; supple, no JVD Cardiovascular; regular rate and rhythm with S1-S2, no murmur rub or gallop Pulmonary; Clear bilaterally Abdomen; soft, she is tender in the right upper right mid and right lower quadrant, there is some fullness in the right upper and right mid quadrant no definite hepatomegaly, nondistended, no palpable mass or splenomegaly, bowel sounds are active Rectal; not done today Skin; benign exam, no jaundice rash or appreciable lesions Extremities; no clubbing cyanosis or edema skin warm and dry Neuro/Psych; alert and oriented x4, grossly nonfocal mood and affect appropriate       Assessment & Plan:   #44 69 year old  white female with prior history of IBS, prior peptic ulcer disease, and GERD who who comes in with 74-monthhistory of fairly constant abdominal pain which on exam is primarily right-sided in the right upper right mid quadrant.  She has been having some pain radiating into her back at times complains of a lot of gas and belching.  Also with early satiety, no weight loss.  Etiology of symptoms is not entirely clear, the constant and more intense nature is not consistent with her prior IBS symptoms, rule out neoplasm, rule out intra-abdominal inflammatory process. Patient is status post cholecystectomy  #2 diverticulosis #3.  History of adenomatous colon polyps-up-to-date with colonoscopy last done July 2019 #4 family history of colon cancer  Plan; CBC with differential, c-Met, lipase, sed rate Patient will be scheduled for CT  scan of the abdomen and pelvis.  She relates prior history of IV contrast allergy and therefore will do CT with oral but no IV contrast. Increase omeprazole to 40 mg p.o. every morning, prescription sent Continue Bentyl 10 mg 3 times daily 1/2-hour AC Start MiraLAX 17 g in 8 ounces of water daily. We will plan office follow-up in a few weeks with Dr. Lyndel Leonard or myself, and further recommendations pending results of above.   Montarius Kitagawa S Alon Mazor PA-C 01/20/2020   Cc: Rochel Brome, MD

## 2020-01-20 NOTE — Telephone Encounter (Signed)
Patient notified that the CT imaging has been moved to 01/29/20 at 1:00 pm.

## 2020-01-23 ENCOUNTER — Telehealth: Payer: Self-pay | Admitting: Physician Assistant

## 2020-01-23 ENCOUNTER — Other Ambulatory Visit: Payer: PPO

## 2020-01-23 ENCOUNTER — Ambulatory Visit: Payer: Medicare Other | Admitting: Family Medicine

## 2020-01-23 NOTE — Telephone Encounter (Signed)
Spoke with the patient. She now has the phone number for IT with My Chart. Reviewed the labs with her. She is pleased that they are normal. She will have the CT imaging next week.

## 2020-01-26 DIAGNOSIS — H52203 Unspecified astigmatism, bilateral: Secondary | ICD-10-CM | POA: Diagnosis not present

## 2020-01-26 DIAGNOSIS — H2513 Age-related nuclear cataract, bilateral: Secondary | ICD-10-CM | POA: Diagnosis not present

## 2020-01-26 DIAGNOSIS — H5203 Hypermetropia, bilateral: Secondary | ICD-10-CM | POA: Diagnosis not present

## 2020-01-26 DIAGNOSIS — H524 Presbyopia: Secondary | ICD-10-CM | POA: Diagnosis not present

## 2020-01-27 ENCOUNTER — Encounter: Payer: Self-pay | Admitting: Family Medicine

## 2020-01-27 NOTE — Progress Notes (Signed)
No showed. Cancelled. kc

## 2020-01-29 ENCOUNTER — Ambulatory Visit (INDEPENDENT_AMBULATORY_CARE_PROVIDER_SITE_OTHER)
Admission: RE | Admit: 2020-01-29 | Discharge: 2020-01-29 | Disposition: A | Discharge status: Home / Self Care | Payer: PPO | Source: Ambulatory Visit | Attending: Physician Assistant | Admitting: Physician Assistant

## 2020-01-29 ENCOUNTER — Ambulatory Visit: Payer: PPO | Admitting: Gastroenterology

## 2020-01-29 ENCOUNTER — Other Ambulatory Visit: Payer: Self-pay

## 2020-01-29 ENCOUNTER — Telehealth: Payer: Self-pay | Admitting: Physician Assistant

## 2020-01-29 ENCOUNTER — Ambulatory Visit (INDEPENDENT_AMBULATORY_CARE_PROVIDER_SITE_OTHER): Payer: PPO | Admitting: Family Medicine

## 2020-01-29 DIAGNOSIS — R6881 Early satiety: Secondary | ICD-10-CM

## 2020-01-29 DIAGNOSIS — K449 Diaphragmatic hernia without obstruction or gangrene: Secondary | ICD-10-CM | POA: Diagnosis not present

## 2020-01-29 DIAGNOSIS — Z8719 Personal history of other diseases of the digestive system: Secondary | ICD-10-CM

## 2020-01-29 DIAGNOSIS — R1013 Epigastric pain: Secondary | ICD-10-CM

## 2020-01-29 DIAGNOSIS — Z86010 Personal history of colonic polyps: Secondary | ICD-10-CM

## 2020-01-29 DIAGNOSIS — E782 Mixed hyperlipidemia: Secondary | ICD-10-CM

## 2020-01-29 DIAGNOSIS — R109 Unspecified abdominal pain: Secondary | ICD-10-CM

## 2020-01-29 DIAGNOSIS — D3502 Benign neoplasm of left adrenal gland: Secondary | ICD-10-CM | POA: Diagnosis not present

## 2020-01-30 NOTE — Telephone Encounter (Signed)
Patricia Leonard  This very nice patient had several questions on how what was found could be responsible for some of her symptoms. She specifically asks if the mentioned findings for the pelvic floor could contribute to the issue of constipation? She has a GYN that she will see soon. I have forwarded a copy to her as well.

## 2020-01-30 NOTE — Telephone Encounter (Signed)
Dr Rodman Key is out of the office today. I have received a called report on the CT for this patient. Would you please review and advise me? I have scheduled this patient a follow up appointment with you at the end of the month. Thank you

## 2020-01-30 NOTE — Telephone Encounter (Signed)
Patient notified

## 2020-01-30 NOTE — Telephone Encounter (Signed)
CT -enlargement of large hiatal hernia, no acute abnormalities. Did show some soft tissue thickening in the region of the cecum.  Had colonoscopy 2019-I have reviewed the pictures.  Cecum looks good  I am glad she has follow-up visit Please let her know about CT results-they look good   RG

## 2020-02-05 DIAGNOSIS — Z20828 Contact with and (suspected) exposure to other viral communicable diseases: Secondary | ICD-10-CM | POA: Diagnosis not present

## 2020-02-06 NOTE — Progress Notes (Signed)
Acute Office Visit  Subjective:    Patient ID: Patricia Leonard, female    DOB: Oct 08, 1950, 69 y.o.   MRN: [id number]  Chief Complaint  Patient presents with  . Discuss Diagnostic Test    Discuss CT results done by another provider. Wants your opinion.  Marland Kitchen Headache    HPI Patient is in today to discuss the most recent CT scan of her abdomen and pelvis she had ordered by Dr. Lyndel Safe.  She has several questions concerning this.  In addition however she has developed a headache reminiscent of migraines she is to have earlier in life.  She reports its over the left eye and is a throbbing pain.  She does report that last week she had a hyperplastic continue to follow-up on her head off the shelf in her kitchen.  There is no significant pain at that time nor has she had a headache until today.  She did not lose consciousness.  She has tried Tylenol and sinus medicine.  Neither helped very much however sitting in the dark room has helped since she arrived.  The patient returned to Dr. Lyndel Safe 1 to 2 months ago complaining of issues with constipation and abdominal discomfort.  She has a history of IBS constipation.  At the time she saw his midlevel provider who ordered a CT scan of her abdomen and pelvis.  In addition she increased her omeprazole to 20 mg twice daily.  The findings on the CT scan of the abdomen and pelvis showed nothing acute however did show 1.  Atherosclerosis of the aorta 2.  Enlargement of large hiatal hernia 3.  Left adrenal 1.9 cm nodule consistent with an adenoma which is unchanged since July 2015 4.  Large colonic stool burden 5.  Soft tissue fullness around the cecum. 6.  Mild pelvic floor laxity. Patient reports she had tried MiraLAX and stool softeners, neither of which were helping.  She took a third of a bottle of magnesium citrate recently and her stools have improved.  She does report of the last 6 months her stools have taken a different shape.  They are thin approximately the  size of a cigar and long.  She reports her last colonoscopy she thinks was in 2018 done by Dr. Lyndel Safe.  She is unclear whether or not she needed to follow-up with Dr. Lyndel Safe.  His staff member called her with her results initially without giving much guidance as to how to treat ,however, then she was offered some sort of physical therapy.  Past Medical History:  Diagnosis Date  . Allergy   . Anemia   . Anxiety   . Aortic atherosclerosis ([place])   . Arthritis   . Cataract   . Depression   . Family history of colon cancer   . Fibromyalgia   . GERD (gastroesophageal reflux disease)   . Hyperlipidemia   . IBS (irritable bowel syndrome)   . Stomach ulcer     Past Surgical History:  Procedure Laterality Date  . APPENDECTOMY    . CESAREAN SECTION    . CHOLECYSTECTOMY    . COLONOSCOPY  04/18/2014   Diverticulosis.   Marland Kitchen ESOPHAGOGASTRODUODENOSCOPY  04/14/2016   Schatzkis ring. Hiatal hernia. Gastritis.   Marland Kitchen LIPOMA EXCISION    . thermal ablation    . TONSILLECTOMY      Family History  Problem Relation Age of Onset  . Colon cancer Mother   . Liver cancer Father   . CAD Father   .  Hypertension Sister   . Colon cancer Sister   . Hyperlipidemia Brother   . Gout Brother   . Diabetes Maternal Grandmother   . Diabetes Paternal Grandmother   . Heart attack Other   . Cancer Other        Leiomyosarcome and Liver  . Migraines Other   . Cancer Maternal Aunt        "female cancer, not breast"  . Cancer Paternal Aunt        "female cancer, not breast"  . Colon polyps Neg Hx   . Esophageal cancer Neg Hx   . Pancreatic cancer Neg Hx   . Rectal cancer Neg Hx   . Stomach cancer Neg Hx     Social History   Socioeconomic History  . Marital status: Married    Spouse name: Not on file  . Number of children: Not on file  . Years of education: Not on file  . Highest education level: Not on file  Occupational History  . Not on file  Tobacco Use  . Smoking status: Never Smoker  .  Smokeless tobacco: Never Used  Substance and Sexual Activity  . Alcohol use: Never  . Drug use: Never  . Sexual activity: Not on file  Other Topics Concern  . Not on file  Social History Narrative  . Not on file   Social Determinants of Health   Financial Resource Strain:   . Difficulty of Paying Living Expenses:   Food Insecurity:   . Worried About Charity fundraiser in the Last Year:   . Arboriculturist in the Last Year:   Transportation Needs:   . Film/video editor (Medical):   Marland Kitchen Lack of Transportation (Non-Medical):   Physical Activity:   . Days of Exercise per Week:   . Minutes of Exercise per Session:   Stress:   . Feeling of Stress :   Social Connections:   . Frequency of Communication with Friends and Family:   . Frequency of Social Gatherings with Friends and Family:   . Attends Religious Services:   . Active Member of Clubs or Organizations:   . Attends Archivist Meetings:   Marland Kitchen Marital Status:   Intimate Partner Violence:   . Fear of Current or Ex-Partner:   . Emotionally Abused:   Marland Kitchen Physically Abused:   . Sexually Abused:     Outpatient Medications Prior to Visit  Medication Sig Dispense Refill  . dicyclomine (BENTYL) 10 MG capsule Take 1 capsule (10 mg total) by mouth 3 (three) times daily before meals. As needed 90 capsule 8  . omeprazole (PRILOSEC) 40 MG capsule Take 1 capsule (40 mg total) by mouth daily. 90 capsule 3  . LINZESS 290 MCG CAPS capsule PLEASE SEE ATTACHED FOR DETAILED DIRECTIONS    . omeprazole (PRILOSEC OTC) 20 MG tablet Take 40 mg by mouth daily.    . RABEprazole (ACIPHEX) 20 MG tablet Take 1 tablet (20 mg total) by mouth daily. (Patient not taking: Reported on 01/19/2020) 30 tablet 11  . 0.9 %  sodium chloride infusion      No facility-administered medications prior to visit.    Allergies  Allergen Reactions  . Amoxicillin-Pot Clavulanate   . Iohexol      Desc: HAD FACIAL RASH WITH CT CONTRAST IN 2006     Review  of Systems  Constitutional: Positive for fatigue. Negative for chills and fever.  HENT: Negative for congestion, ear pain, rhinorrhea and sore  throat.   Respiratory: Positive for shortness of breath (with exertion). Negative for cough.   Cardiovascular: Negative for chest pain.  Gastrointestinal: Positive for constipation (Had CT due to pain on rt side of abdomen and decrease in appetite. constipation has improved some.). Negative for abdominal pain, diarrhea, nausea and vomiting.  Genitourinary: Negative for dysuria and urgency.  Musculoskeletal: Negative for back pain and myalgias.  Neurological: Positive for numbness (and tingling in her face yesterday. Numbness and tingling in her fingers tips for the last week (since being hit in the head with a storage container in a store)) and headaches (feels weak. Patient states that something fell off a shelf and hit her on the head. above her left eye.). Negative for dizziness, weakness and light-headedness.  Psychiatric/Behavioral: Negative for dysphoric mood. The patient is not nervous/anxious.        Objective:    Physical Exam Vitals reviewed. Exam conducted with a chaperone present.  Constitutional:      Appearance: Normal appearance. She is normal weight.  Cardiovascular:     Rate and Rhythm: Normal rate and regular rhythm.     Pulses: Normal pulses.     Heart sounds: Normal heart sounds.  Pulmonary:     Effort: Pulmonary effort is normal. No respiratory distress.     Breath sounds: Normal breath sounds.  Abdominal:     General: Abdomen is flat. Bowel sounds are normal.     Palpations: Abdomen is soft.     Tenderness: There is no abdominal tenderness.     Comments: Rectal exam done.  Rectocele present.  A small amount of stool felt within rectocele.  Minimal stool in the rest of the vault.  Genitourinary:    General: Normal vulva.    Neurological:     Mental Status: She is alert and oriented to person, place, and time.    Psychiatric:        Mood and Affect: Mood normal.        Behavior: Behavior normal.     BP 130/75   Pulse 75   Temp (!) 97.3 F (36.3 C)   Ht 5\' 7"  (1.702 m)   Wt 232 lb (105.2 kg)   SpO2 98%   BMI 36.34 kg/m  Wt Readings from Last 3 Encounters:  02/09/20 232 lb (105.2 kg)  01/19/20 236 lb 12.8 oz (107.4 kg)  10/31/19 237 lb 6.4 oz (107.7 kg)    Health Maintenance Due  Topic Date Due  . Hepatitis C Screening  Never done  . COVID-19 Vaccine (1) Never done  . TETANUS/TDAP  Never done  . DEXA SCAN  Never done  . PNA vac Low Risk Adult (1 of 2 - PCV13) Never done    There are no preventive care reminders to display for this patient.   No results found for: TSH Lab Results  Component Value Date   WBC 5.0 01/19/2020   HGB 14.2 01/19/2020   HCT 42.3 01/19/2020   MCV 86.3 01/19/2020   PLT 199.0 01/19/2020   Lab Results  Component Value Date   NA 139 01/19/2020   K 4.4 01/19/2020   CO2 30 01/19/2020   GLUCOSE 98 01/19/2020   BUN 14 01/19/2020   CREATININE 0.85 01/19/2020   BILITOT 0.6 01/19/2020   ALKPHOS 77 01/19/2020   AST 19 01/19/2020   ALT 18 01/19/2020   PROT 7.1 01/19/2020   ALBUMIN 4.2 01/19/2020   CALCIUM 9.6 01/19/2020   GFR 65.95 01/19/2020   No results  found for: CHOL No results found for: HDL No results found for: LDLCALC No results found for: TRIG No results found for: CHOLHDL No results found for: HGBA1C     Assessment & Plan:  1. Change in stool Soft tissue fullness around the cecum seen on ct scan. With change in stool she may be a candidate for repeat colonoscopy. I will reach out to Dr. Lyndel Safe and discuss.  2. Rectocele Education given.  Consider physical therapy. Recommend follow-up with GI.  3. Mixed hyperlipidemia Poorly controlled.  Start on crestor. Continue to work on eating a healthy diet and exercise.  Labs drawn today.  - Lipid panel; Future  4. Aortic atherosclerosis ([place]) Start on Crestor.  Check lipid panel  tomorrow. 5. Irritable bowel syndrome with constipation Continue Linzess 290 mcg once daily in a.m.  6. Hiatal hernia Continue omeprazole at increased dose.   7. Other migraine without status migrainosus, intractable Recommended toradol shot, but pt preferred not to as she has a meeting tonight at her church.  Given samples of ubrelvy with instructions.    Meds ordered this encounter  Medications  . rosuvastatin (CRESTOR) 10 MG tablet    Sig: Take 1 tablet (10 mg total) by mouth daily.    Dispense:  90 tablet    Refill:  0    Orders Placed This Encounter  Procedures  . Lipid panel     Follow-up: Return in about 3 months (around 05/11/2020) for fasting visit.Marland Kitchen  An After Visit Summary was printed and given to the patient.  Rochel Brome [facility] [phone]

## 2020-02-09 ENCOUNTER — Ambulatory Visit (INDEPENDENT_AMBULATORY_CARE_PROVIDER_SITE_OTHER): Payer: PPO | Admitting: Family Medicine

## 2020-02-09 ENCOUNTER — Other Ambulatory Visit: Payer: Self-pay

## 2020-02-09 ENCOUNTER — Encounter: Payer: Self-pay | Admitting: Family Medicine

## 2020-02-09 VITALS — BP 130/75 | HR 75 | Temp 97.3°F | Ht 67.0 in | Wt 232.0 lb

## 2020-02-09 DIAGNOSIS — G43909 Migraine, unspecified, not intractable, without status migrainosus: Secondary | ICD-10-CM | POA: Insufficient documentation

## 2020-02-09 DIAGNOSIS — I700 Atherosclerosis of aorta: Secondary | ICD-10-CM | POA: Diagnosis not present

## 2020-02-09 DIAGNOSIS — G43819 Other migraine, intractable, without status migrainosus: Secondary | ICD-10-CM | POA: Diagnosis not present

## 2020-02-09 DIAGNOSIS — E782 Mixed hyperlipidemia: Secondary | ICD-10-CM | POA: Insufficient documentation

## 2020-02-09 DIAGNOSIS — K449 Diaphragmatic hernia without obstruction or gangrene: Secondary | ICD-10-CM | POA: Diagnosis not present

## 2020-02-09 DIAGNOSIS — R195 Other fecal abnormalities: Secondary | ICD-10-CM

## 2020-02-09 DIAGNOSIS — N816 Rectocele: Secondary | ICD-10-CM | POA: Insufficient documentation

## 2020-02-09 DIAGNOSIS — K581 Irritable bowel syndrome with constipation: Secondary | ICD-10-CM | POA: Diagnosis not present

## 2020-02-09 MED ORDER — ROSUVASTATIN CALCIUM 10 MG PO TABS: 10.0000 mg | ORAL_TABLET | Freq: Every day | ORAL | 0 refills | Status: DC

## 2020-02-09 NOTE — Patient Instructions (Addendum)
I am going to call to Dr.Gupta. Start on crestor 10 mg once daily at night.  Low fat diet.  High fiber diet.  Rectal Prolapse, Adult Rectal prolapse happens when the inside of the last section of the large intestine (rectum) drops down into an abnormal position. There are two types of rectal prolapse:  Partial. In partial rectal prolapse, the inner lining of the rectum falls or sinks out of place and may stick out of the anus.  Complete. In complete rectal prolapse, all layers of the rectum fall or sink out of place and may stick out of the anus. At first, rectal prolapse may be temporary. It may happen only when you are having a bowel movement. Over time, the prolapse will likely get worse. It may start to happen more often and cause uncomfortable symptoms. Eventually, the prolapse may happen when you are walking or standing. Surgery is often needed for this condition. What are the causes? This condition may be caused by weakness in the muscles that attach the rectum to the inside of the lower abdomen. The exact cause of this muscle weakness is often not known. What increases the risk? You are more likely to develop this condition if you:  Have a history of chronic constipation or diarrhea.  Frequently strain to have a bowel movement.  Are a woman who is 71 years of age or older.  Have a neurological disorder, such as multiple sclerosis, or lower spinal cord injury.  Are a woman who has been pregnant many times.  Have had pelvic or rectal surgery.  Have cystic fibrosis. What are the signs or symptoms? The main symptom of this condition is a red mass of tissue sticking out of your anus. The mass may appear inflamed, have mucus, or bleed slightly.  At first, the mass may only appear after a bowel movement.  The mass may then start to appear more often. Other symptoms may include:  Discomfort in the anus and rectum.  Constipation.  Feeling that stool is not completely emptied  from the rectum.  Diarrhea.  Leaking of stool, mucus, or blood from the anus (fecal incontinence).  Feeling like you are sitting on a ball. How is this diagnosed? This condition may be diagnosed based on your symptoms and a physical exam.  During the exam, you may be asked to squat and strain as though you are having a bowel movement.  You may also have tests, such as: ? A rectal exam using a flexible scope (sigmoidoscopy or colonoscopy). ? A procedure that involves taking X-rays of your rectum after a dye (contrast material) is injected into the rectum (defecogram). How is this treated? This condition is usually treated with surgery to repair the weakened muscles and to reconnect the rectum to attachments inside the lower abdomen. Other treatment options may include:  Pushing the prolapsed area back into the rectum (reduction). ? Your health care provider may do this by gently pushing it back in using a moist cloth. ? The health care provider may show you how to do this at home if the prolapse occurs again.  Medicines to prevent constipation and straining. This may include laxatives or stool softeners. Follow these instructions at home: General instructions   Take over-the-counter and prescription medicines only as told by your health care provider.  Do not strain to have a bowel movement.  Do not lift anything that is heavier than 10 lb (4.5 kg), or the limit that you are told, until your  health care provider says that it is safe.  Follow instructions from your health care provider about what to do if the prolapse occurs again and does not go back in. This may involve lying on your side and using a moist cloth to gently press the lump into your rectum.  Keep all follow-up visits as told by your health care provider. This is important. Preventing constipation To prevent or treat constipation, your health care provider may recommend that you:  Drink enough fluid to keep your  urine pale yellow.  Take over-the-counter or prescription medicines.  Eat foods that are high in fiber, such as beans, whole grains, and fresh fruits and vegetables.  Limit foods that are high in fat and processed sugars, such as fried or sweet foods.  Contact a health care provider if:  You have a fever.  Your prolapse cannot be reduced at home.  You have constipation or diarrhea.  You have mild rectal bleeding. Get help right away if:  You have very bad rectal pain.  You bleed heavily from your rectum. Summary  Rectal prolapse happens when the inside of the rectum drops down into an abnormal position. In some cases, the rectum may partially or completely push out through the anal opening.  This condition is usually treated with surgery to repair the weakened muscles and to reconnect the rectum to attachments inside the lower abdomen.  Take over-the-counter and prescription medicines only as told by your health care provider.  Follow instructions from your health care provider about what to do if the prolapse occurs again and does not go back in.  Get help right away if you have very bad rectal pain or if you bleed heavily from your rectum. This information is not intended to replace advice given to you by your health care provider. Make sure you discuss any questions you have with your health care provider. Document Revised: 03/20/2018 Document Reviewed: 03/20/2018 Elsevier Patient Education  [place].

## 2020-02-10 ENCOUNTER — Telehealth: Payer: Self-pay | Admitting: Gastroenterology

## 2020-02-10 ENCOUNTER — Other Ambulatory Visit: Payer: PPO

## 2020-02-10 DIAGNOSIS — E782 Mixed hyperlipidemia: Secondary | ICD-10-CM | POA: Diagnosis not present

## 2020-02-10 NOTE — Telephone Encounter (Signed)
Dr Cox sent me a message thru [facility] having several GI problems CT showed cecal thickening She had neg colon 2019 Very concerned about colon Ca   Plan: - Pl work her into my next or next to next clinic  RG

## 2020-02-11 LAB — LIPID PANEL
Chol/HDL Ratio: 4.2 ratio (ref 0.0–4.4)
Cholesterol, Total: 218 mg/dL — ABNORMAL HIGH (ref 100–199)
HDL: 52 mg/dL (ref >39)
LDL Chol Calc (NIH): 142 mg/dL — ABNORMAL HIGH (ref 0–99)
Triglycerides: 136 mg/dL (ref 0–149)
VLDL Cholesterol Cal: 24 mg/dL (ref 5–40)

## 2020-02-11 LAB — CARDIOVASCULAR RISK ASSESSMENT

## 2020-02-11 NOTE — Telephone Encounter (Signed)
I have called patient and left message for patient to return my call.

## 2020-02-12 ENCOUNTER — Telehealth: Payer: Self-pay

## 2020-02-12 NOTE — Telephone Encounter (Signed)
Patient returned call. She stated she needed to cancel schedule appointment.  I told her first available with Dr. Lyndel Safe is 03/11/20.  I offered an appointment with PA or NP but she stated she thought Dr. Tobie Poet wanted her to see Dr. Lyndel Safe, therefore she would  Wait.  I told her I would place her on a wait list incase of a cancellation.

## 2020-02-20 ENCOUNTER — Ambulatory Visit: Payer: PPO | Admitting: Gastroenterology

## 2020-02-22 DIAGNOSIS — S30860A Insect bite (nonvenomous) of lower back and pelvis, initial encounter: Secondary | ICD-10-CM | POA: Diagnosis not present

## 2020-02-24 ENCOUNTER — Telehealth: Payer: Self-pay

## 2020-02-24 NOTE — Telephone Encounter (Signed)
[place] by urgent care, she was prescribed Doxycycline for 14 days, and was told to call and make sure Dr. Tobie Poet wanted her on the medication for that long.

## 2020-02-24 NOTE — Telephone Encounter (Signed)
I would complete the doxycycline unless the urgent care called and her recommended spotted fever and Lyme titers were normal.  In that case she may stop it.  Dr. Tobie Poet

## 2020-02-26 DIAGNOSIS — D485 Neoplasm of uncertain behavior of skin: Secondary | ICD-10-CM | POA: Diagnosis not present

## 2020-02-26 DIAGNOSIS — D225 Melanocytic nevi of trunk: Secondary | ICD-10-CM | POA: Diagnosis not present

## 2020-02-26 DIAGNOSIS — D2239 Melanocytic nevi of other parts of face: Secondary | ICD-10-CM | POA: Diagnosis not present

## 2020-02-26 DIAGNOSIS — L814 Other melanin hyperpigmentation: Secondary | ICD-10-CM | POA: Diagnosis not present

## 2020-02-26 DIAGNOSIS — L821 Other seborrheic keratosis: Secondary | ICD-10-CM | POA: Diagnosis not present

## 2020-02-26 DIAGNOSIS — L820 Inflamed seborrheic keratosis: Secondary | ICD-10-CM | POA: Diagnosis not present

## 2020-02-26 NOTE — Telephone Encounter (Signed)
Patient is going to complete the antibiotic.

## 2020-03-11 ENCOUNTER — Ambulatory Visit (INDEPENDENT_AMBULATORY_CARE_PROVIDER_SITE_OTHER): Payer: PPO | Admitting: Gastroenterology

## 2020-03-11 ENCOUNTER — Encounter: Payer: Self-pay | Admitting: Gastroenterology

## 2020-03-11 VITALS — BP 132/80 | HR 70 | Temp 98.2°F | Ht 66.0 in | Wt 235.4 lb

## 2020-03-11 DIAGNOSIS — K581 Irritable bowel syndrome with constipation: Secondary | ICD-10-CM | POA: Diagnosis not present

## 2020-03-11 DIAGNOSIS — K449 Diaphragmatic hernia without obstruction or gangrene: Secondary | ICD-10-CM | POA: Diagnosis not present

## 2020-03-11 DIAGNOSIS — K219 Gastro-esophageal reflux disease without esophagitis: Secondary | ICD-10-CM | POA: Diagnosis not present

## 2020-03-11 DIAGNOSIS — Z800 Family history of malignant neoplasm of digestive organs: Secondary | ICD-10-CM

## 2020-03-11 MED ORDER — OMEPRAZOLE 40 MG PO CPDR
40.0000 mg | DELAYED_RELEASE_CAPSULE | Freq: Two times a day (BID) | ORAL | 3 refills | Status: DC
Start: 2020-03-11 — End: 2021-01-27

## 2020-03-11 MED ORDER — CILIDINIUM-CHLORDIAZEPOXIDE 2.5-5 MG PO CAPS
1.0000 | ORAL_CAPSULE | Freq: Three times a day (TID) | ORAL | 0 refills | Status: DC
Start: 2020-03-11 — End: 2020-11-16

## 2020-03-11 NOTE — Patient Instructions (Addendum)
If you are age 71 or older, your body mass index should be between 23-30. Your Body mass index is 37.99 kg/m. If this is out of the aforementioned range listed, please consider follow up with your Primary Care Provider.  If you are age 44 or younger, your body mass index should be between 19-25. Your Body mass index is 37.99 kg/m. If this is out of the aformentioned range listed, please consider follow up with your Primary Care Provider.    You have been scheduled for an Upper GI Series at [facility]. Your appointment is on           at lease arrive 15 minutes prior to your test for registration. Make sure not to eat or drink anything after midnight on the night before your test. If you need to reschedule, please call radiology at [phone]. ________________________________________________________________ An upper GI series uses x rays to help diagnose problems of the upper GI tract, which includes the esophagus, stomach, and duodenum. The duodenum is the first part of the small intestine. An upper GI series is conducted by a radiology technologist or a radiologist--a doctor who specializes in x-ray imaging--at a hospital or outpatient center. While sitting or standing in front of an x-ray machine, the patient drinks barium liquid, which is often white and has a chalky consistency and taste. The barium liquid coats the lining of the upper GI tract and makes signs of disease show up more clearly on x rays. X-ray video, called fluoroscopy, is used to view the barium liquid moving through the esophagus, stomach, and duodenum. Additional x rays and fluoroscopy are performed while the patient lies on an x-ray table. To fully coat the upper GI tract with barium liquid, the technologist or radiologist may press on the abdomen or ask the patient to change position. Patients hold still in various positions, allowing the technologist or radiologist to take x rays of the upper GI tract at different  angles. If a technologist conducts the upper GI series, a radiologist will later examine the images to look for problems.  This test typically takes about 1 hour to complete. __________________________________________________________________ We have sent the following medications to your pharmacy for you to pick up at your convenience: Omeprazole 40 mg twice daily. Librax take 1 three times daily for 14 days.  Miralax 17 gram twice daily. Continue Magnesium 400 mg daily. Take Senna 2 a day.  Start Kegel exercises.  Follow up with me in twelve weeks.  Please call the office for an appointment.  Thank you for choosing me and [facility].   Jackquline Denmark, MD

## 2020-03-11 NOTE — Progress Notes (Signed)
IMPRESSION and PLAN:    #1.  GERD with HH, now with early satiety/RUQ pain. EGD 03/2018 showing 3 cm HH. Recent CT 03/2010 showing enlarging hiatal hernia.  S/P chole in past #2.  H/O small tubular adenoma on colon 05/2018, FH colon cancer. Next colon due 03/2023. #3.  IBS-C. Failed linzess. Has rectal prolapse/pelvic floor laxity/pelvic floor dyssynergia (never had rectal manometry)  Plan: -UGI series to evaluate HH. -Increased omeprazole 40mg  po bid #60 -Miralax 17g po bid until large bowel movement, then once a day. -Continue Mg 400 QD -Add senna 2/day. -Librax 1 tab po tid x 2 weeks  -Kegel's exercises. -I have asked her to get in touch with Dr. Tobie Poet regarding situational anxiety.  I do believe it is playing a significant role in her GI symptoms. -RTC 12 weeks.        HPI:    Chief Complaint:   Patricia Leonard is a 68 y.o. female  For follow-up visit. Continues to have multiple GI problems  -Upper GI including early satiety, intermittent heartburn without any significant nausea/vomiting/odynophagia/dysphagia.  She does have some upper abdominal discomfort after eating. Underwent EGD followed by CT Abdo/pelvis which showed hiatal hernia.  There was some suggestion of enlarging hiatal hernia on CT.  -Lower GI problems including more constipation lately.  Her CT Abdo/pelvis showed questionable soft tissue density in the cecum/ascending colon.  However, colonoscopy with random colonic biopsies 03/2018 was neg for any masses.  She continues to struggle with abdominal bloating, lower abdominal discomfort which gets better with defecation.  She could not tolerate Linzess.  Wants to do more natural way rather than taking medications.  -When she strains, she does complain of rectal prolapse.  No rectal bleeding.  Has appointment with Dr. Dian Queen in July.  No recent weight loss or loss of appetite.  Wt Readings from Last 3 Encounters:  03/11/20 235 lb 6 oz (106.8 kg)    02/09/20 232 lb (105.2 kg)  01/19/20 236 lb 12.8 oz (107.4 kg)       SH-significant associated situational anxiety.  Unfortunately her husband passed away.  She does feel alone.   Past GI procedures: -EGD 03/2018: 3 cm HH, mild gastritis.  Negative biopsies for HP.  04/14/2016-3 cm HH.  Neg SB Bx for celiac.  EGD 11/30/2014: Gastric ulcers, hiatal hernia, neg Bx -Colonoscopy 03/2018: Normal cecum.  6 mm polyp s/p polypectomy (TA), moderate left colonic diverticulosis, internal hemorrhoids, negative random colonic biopsies for microscopic colitis.  Repeat in 5 years.  04/20/2014; mild diverticulosis, previous hemorrhoidectomy.  Otherwise normal colonoscopy -CT AP 01/29/2020: Large HH, soft tissue fullness in the cecum, pelvic floor laxity, constipation.  03/26/2014: Small adrenal adenoma, otherwise normal.   Past Medical History:  Diagnosis Date  . Allergy   . Anemia   . Anxiety   . Aortic atherosclerosis ([place])   . Arthritis   . Cataract   . Depression   . Family history of colon cancer   . Fibromyalgia   . GERD (gastroesophageal reflux disease)   . Hyperlipidemia   . IBS (irritable bowel syndrome)   . Rectal prolapse   . Stomach ulcer   . Tick bite    took antibiotics memorial day weekend 2021 on back    Current Outpatient Medications  Medication Sig Dispense Refill  . dicyclomine (BENTYL) 10 MG capsule Take 1 capsule (10 mg total) by mouth 3 (three) times daily before meals. As needed 90 capsule 8  .  omeprazole (PRILOSEC) 40 MG capsule Take 1 capsule (40 mg total) by mouth daily. 90 capsule 3  . rosuvastatin (CRESTOR) 10 MG tablet Take 1 tablet (10 mg total) by mouth daily. (Patient not taking: Reported on 03/11/2020) 90 tablet 0   No current facility-administered medications for this visit.     Family History  Problem Relation Age of Onset  . Colon cancer Mother   . Liver cancer Father   . CAD Father   . Hypertension Sister   . Colon cancer Sister   . Hyperlipidemia  Brother   . Gout Brother   . Diabetes Maternal Grandmother   . Diabetes Paternal Grandmother   . Heart attack Other   . Cancer Other        Leiomyosarcome and Liver  . Migraines Other   . Cancer Maternal Aunt        "female cancer, not breast"  . Cancer Paternal Aunt        "female cancer, not breast"  . Colon polyps Neg Hx   . Esophageal cancer Neg Hx   . Pancreatic cancer Neg Hx   . Rectal cancer Neg Hx   . Stomach cancer Neg Hx     Social History   Tobacco Use  . Smoking status: Never Smoker  . Smokeless tobacco: Never Used  Vaping Use  . Vaping Use: Never used  Substance Use Topics  . Alcohol use: Never  . Drug use: Never    Allergies  Allergen Reactions  . Amoxicillin-Pot Clavulanate     Stomach pain   . Iohexol      Desc: HAD FACIAL RASH WITH CT CONTRAST IN 2006      Review of Systems: All systems reviewed and negative except where noted in HPI.    Physical Exam:     BP 132/80   Pulse 70   Temp 98.2 F (36.8 C)   Ht 5\' 6"  (1.676 m)   Wt 235 lb 6 oz (106.8 kg)   BMI 37.99 kg/m  @WEIGHTLAST3 @ GENERAL:  Alert, oriented, cooperative, not in acute distress. PSYCH: :Pleasant, normal mood and affect. HEENT:  conjunctiva pink, mucous membranes moist, neck supple without masses. No jaundice. ABDOMEN: Inspection: No visible peristalsis, no abnormal pulsations, skin normal.  Palpation/percussion: Soft, nontender, nondistended, no rigidity, no abnormal dullness to percussion, no hepatosplenomegaly and no palpable abdominal masses.  Auscultation: Normal bowel sounds, no abdominal bruits. Rectal exam: Deferred SKIN:  turgor, no lesions seen. Musculoskeletal:  Normal muscle tone, normal strength. NEURO: Alert and oriented x 3, no focal neurologic deficits.    Patricia Warmuth,MD 03/11/2020, 9:59 AM   CC Cox, Kirsten, MD

## 2020-03-12 ENCOUNTER — Telehealth: Payer: Self-pay | Admitting: Gastroenterology

## 2020-03-12 NOTE — Telephone Encounter (Signed)
Pt reported that Librax requires a PA.  She also informed that she is taking magnesium 500 mg.

## 2020-03-12 NOTE — Telephone Encounter (Signed)
Left message on voicemail regarding UGI series at [facility] on 6/25/212 at 11 am.  Pt to return call if further questions.

## 2020-03-15 DIAGNOSIS — D485 Neoplasm of uncertain behavior of skin: Secondary | ICD-10-CM | POA: Diagnosis not present

## 2020-03-19 ENCOUNTER — Other Ambulatory Visit: Payer: Self-pay | Admitting: Gastroenterology

## 2020-03-19 ENCOUNTER — Ambulatory Visit (HOSPITAL_COMMUNITY)
Admission: RE | Admit: 2020-03-19 | Discharge: 2020-03-19 | Disposition: A | Discharge status: Home / Self Care | Payer: PPO | Source: Ambulatory Visit | Attending: Gastroenterology | Admitting: Gastroenterology

## 2020-03-19 ENCOUNTER — Other Ambulatory Visit: Payer: Self-pay

## 2020-03-19 DIAGNOSIS — K449 Diaphragmatic hernia without obstruction or gangrene: Secondary | ICD-10-CM | POA: Insufficient documentation

## 2020-03-19 DIAGNOSIS — K219 Gastro-esophageal reflux disease without esophagitis: Secondary | ICD-10-CM | POA: Diagnosis not present

## 2020-04-12 DIAGNOSIS — M9902 Segmental and somatic dysfunction of thoracic region: Secondary | ICD-10-CM | POA: Diagnosis not present

## 2020-04-12 DIAGNOSIS — M545 Low back pain: Secondary | ICD-10-CM | POA: Diagnosis not present

## 2020-04-12 DIAGNOSIS — M9903 Segmental and somatic dysfunction of lumbar region: Secondary | ICD-10-CM | POA: Diagnosis not present

## 2020-04-12 DIAGNOSIS — M9905 Segmental and somatic dysfunction of pelvic region: Secondary | ICD-10-CM | POA: Diagnosis not present

## 2020-04-12 DIAGNOSIS — M542 Cervicalgia: Secondary | ICD-10-CM | POA: Diagnosis not present

## 2020-04-12 DIAGNOSIS — M9901 Segmental and somatic dysfunction of cervical region: Secondary | ICD-10-CM | POA: Diagnosis not present

## 2020-04-14 DIAGNOSIS — M542 Cervicalgia: Secondary | ICD-10-CM | POA: Diagnosis not present

## 2020-04-14 DIAGNOSIS — M9905 Segmental and somatic dysfunction of pelvic region: Secondary | ICD-10-CM | POA: Diagnosis not present

## 2020-04-14 DIAGNOSIS — M9903 Segmental and somatic dysfunction of lumbar region: Secondary | ICD-10-CM | POA: Diagnosis not present

## 2020-04-14 DIAGNOSIS — M545 Low back pain: Secondary | ICD-10-CM | POA: Diagnosis not present

## 2020-04-14 DIAGNOSIS — M9902 Segmental and somatic dysfunction of thoracic region: Secondary | ICD-10-CM | POA: Diagnosis not present

## 2020-04-14 DIAGNOSIS — M9901 Segmental and somatic dysfunction of cervical region: Secondary | ICD-10-CM | POA: Diagnosis not present

## 2020-04-16 DIAGNOSIS — Z20828 Contact with and (suspected) exposure to other viral communicable diseases: Secondary | ICD-10-CM | POA: Diagnosis not present

## 2020-04-16 DIAGNOSIS — R05 Cough: Secondary | ICD-10-CM | POA: Diagnosis not present

## 2020-04-16 DIAGNOSIS — M791 Myalgia, unspecified site: Secondary | ICD-10-CM | POA: Diagnosis not present

## 2020-04-16 DIAGNOSIS — R0981 Nasal congestion: Secondary | ICD-10-CM | POA: Diagnosis not present

## 2020-04-16 DIAGNOSIS — M545 Low back pain: Secondary | ICD-10-CM | POA: Diagnosis not present

## 2020-04-26 DIAGNOSIS — K623 Rectal prolapse: Secondary | ICD-10-CM | POA: Diagnosis not present

## 2020-04-26 DIAGNOSIS — Z6836 Body mass index (BMI) 36.0-36.9, adult: Secondary | ICD-10-CM | POA: Diagnosis not present

## 2020-04-26 DIAGNOSIS — Z01419 Encounter for gynecological examination (general) (routine) without abnormal findings: Secondary | ICD-10-CM | POA: Diagnosis not present

## 2020-05-07 ENCOUNTER — Other Ambulatory Visit: Payer: Self-pay | Admitting: Family Medicine

## 2020-05-13 ENCOUNTER — Ambulatory Visit: Payer: PPO | Admitting: Family Medicine

## 2020-05-14 ENCOUNTER — Other Ambulatory Visit: Payer: Self-pay

## 2020-05-14 ENCOUNTER — Ambulatory Visit (INDEPENDENT_AMBULATORY_CARE_PROVIDER_SITE_OTHER): Payer: PPO | Admitting: Family Medicine

## 2020-05-14 VITALS — BP 124/68 | HR 72 | Temp 98.1°F | Resp 16 | Ht 66.0 in | Wt 235.0 lb

## 2020-05-14 DIAGNOSIS — K449 Diaphragmatic hernia without obstruction or gangrene: Secondary | ICD-10-CM | POA: Diagnosis not present

## 2020-05-14 DIAGNOSIS — M25541 Pain in joints of right hand: Secondary | ICD-10-CM

## 2020-05-14 DIAGNOSIS — M25542 Pain in joints of left hand: Secondary | ICD-10-CM

## 2020-05-14 DIAGNOSIS — M7711 Lateral epicondylitis, right elbow: Secondary | ICD-10-CM | POA: Diagnosis not present

## 2020-05-14 DIAGNOSIS — E782 Mixed hyperlipidemia: Secondary | ICD-10-CM

## 2020-05-14 DIAGNOSIS — M720 Palmar fascial fibromatosis [Dupuytren]: Secondary | ICD-10-CM | POA: Diagnosis not present

## 2020-05-14 DIAGNOSIS — K219 Gastro-esophageal reflux disease without esophagitis: Secondary | ICD-10-CM

## 2020-05-14 MED ORDER — TRIAMCINOLONE ACETONIDE 40 MG/ML IJ SUSP
20.0000 mg | Freq: Once | INTRAMUSCULAR | Status: AC
  Administered 2020-05-14: 20 mg via INTRA_ARTICULAR

## 2020-05-14 NOTE — Progress Notes (Signed)
Acute Office Visit  Subjective:    Patient ID: Patricia Leonard, female    DOB: May 24, 1951, 69 y.o.   MRN: [id number]  Chief Complaint  Patient presents with   Arm Pain    right    HPI Patient is in today with a multitude of concerns.  The patient has a difficult last year. Patient is complaining of right arm pain.  She reports the greatest arm pain is her proximal forearm.  She does have right shoulder pain as well as right wrist pain.  She has numbness and tingling that is intermittent on the tips of all of her digits of her right hand and sometimes on her left hand as well.  She has taken naproxen which she is not supposed to take due to her history of peptic ulcers and severe hiatal hernia however this seems to be the only thing that gives her any relief.  She does try some Tylenol as well.  She is also complicated in that her hand she has left Dupuytren's and she is concerned she is starting to get the right hand with Dupuytren's.  Patient is also had issues in the last 3 months with a tick bite in early May for which she received doxycycline.  She feels like she has been aching all over at times.  She was seen in the urgent care in July and was treated with a Z-Pak.  She was tested for Lyme disease, flu, Covid and all were negative.  She has seen Dr. Lyndel Safe who told her she has a hiatal hernia that goes  60% above the diaphragm.  Past Medical History:  Diagnosis Date   Allergy    Anemia    Anxiety    Aortic atherosclerosis (HCC)    Arthritis    Cataract    Depression    Family history of colon cancer    Fibromyalgia    GERD (gastroesophageal reflux disease)    Hyperlipidemia    IBS (irritable bowel syndrome)    Rectal prolapse    Stomach ulcer    Tick bite    took antibiotics memorial day weekend 2021 on back    Past Surgical History:  Procedure Laterality Date   APPENDECTOMY     CESAREAN SECTION     CHOLECYSTECTOMY     COLONOSCOPY  04/18/2014     Diverticulosis.    ESOPHAGOGASTRODUODENOSCOPY  04/14/2016   Schatzkis ring. Hiatal hernia. Gastritis.    LIPOMA EXCISION     thermal ablation     TONSILLECTOMY      Family History  Problem Relation Age of Onset   Colon cancer Mother    Liver cancer Father    CAD Father    Hypertension Sister    Colon cancer Sister    Hyperlipidemia Brother    Gout Brother    Diabetes Maternal Grandmother    Diabetes Paternal Grandmother    Heart attack Other    Cancer Other        Leiomyosarcome and Liver   Migraines Other    Cancer Maternal Aunt        "female cancer, not breast"   Cancer Paternal Aunt        "female cancer, not breast"   Colon polyps Neg Hx    Esophageal cancer Neg Hx    Pancreatic cancer Neg Hx    Rectal cancer Neg Hx    Stomach cancer Neg Hx     Social History   Socioeconomic History  Marital status: Widowed    Spouse name: Not on file   Number of children: Not on file   Years of education: Not on file   Highest education level: Not on file  Occupational History   Not on file  Tobacco Use   Smoking status: Never Smoker   Smokeless tobacco: Never Used  Vaping Use   Vaping Use: Never used  Substance and Sexual Activity   Alcohol use: Never   Drug use: Never   Sexual activity: Not on file  Other Topics Concern   Not on file  Social History Narrative   Not on file   Social Determinants of Health   Financial Resource Strain:    Difficulty of Paying Living Expenses: Not on file  Food Insecurity:    Worried About [place] in the Last Year: Not on file   Ran Out of Food in the Last Year: Not on file  Transportation Needs:    Lack of Transportation (Medical): Not on file   Lack of Transportation (Non-Medical): Not on file  Physical Activity:    Days of Exercise per Week: Not on file   Minutes of Exercise per Session: Not on file  Stress:    Feeling of Stress : Not on file  Social  Connections:    Frequency of Communication with Friends and Family: Not on file   Frequency of Social Gatherings with Friends and Family: Not on file   Attends Religious Services: Not on file   Active Member of Clubs or Organizations: Not on file   Attends Archivist Meetings: Not on file   Marital Status: Not on file  Intimate Partner Violence:    Fear of Current or Ex-Partner: Not on file   Emotionally Abused: Not on file   Physically Abused: Not on file   Sexually Abused: Not on file    Outpatient Medications Prior to Visit  Medication Sig Dispense Refill   dicyclomine (BENTYL) 10 MG capsule Take 1 capsule (10 mg total) by mouth 3 (three) times daily before meals. As needed 90 capsule 8   omeprazole (PRILOSEC) 40 MG capsule Take 1 capsule (40 mg total) by mouth in the morning and at bedtime. 60 capsule 3   clidinium-chlordiazePOXIDE (LIBRAX) 5-2.5 MG capsule Take 1 capsule by mouth 3 (three) times daily before meals. (Patient not taking: Reported on 05/14/2020) 42 capsule 0   rosuvastatin (CRESTOR) 10 MG tablet TAKE 1 TABLET BY MOUTH EVERY DAY (Patient not taking: Reported on 05/14/2020) 90 tablet 0   No facility-administered medications prior to visit.    Allergies  Allergen Reactions   Amoxicillin-Pot Clavulanate     Stomach pain    Iohexol      Desc: HAD FACIAL RASH WITH CT CONTRAST IN 2006     Review of Systems  Constitutional: Positive for fatigue. Negative for chills and fever.  HENT: Positive for congestion and rhinorrhea. Negative for sore throat.   Respiratory: Positive for cough and shortness of breath.   Cardiovascular: Positive for palpitations. Negative for chest pain.  Gastrointestinal: Positive for nausea. Negative for abdominal pain, constipation, diarrhea and vomiting.  Endocrine: Positive for polyphagia. Negative for polydipsia.  Genitourinary: Negative for dysuria, frequency and urgency.  Musculoskeletal: Positive for arthralgias  and myalgias. Negative for back pain.  Neurological: Negative for dizziness, weakness, light-headedness and headaches.  Psychiatric/Behavioral: Negative for dysphoric mood. The patient is not nervous/anxious.        Objective:    Physical Exam Vitals  reviewed.  Constitutional:      Appearance: She is obese.  Cardiovascular:     Rate and Rhythm: Normal rate and regular rhythm.     Heart sounds: Normal heart sounds.  Pulmonary:     Effort: Pulmonary effort is normal. No respiratory distress.     Breath sounds: Normal breath sounds.  Abdominal:     General: Bowel sounds are normal.     Palpations: Abdomen is soft.     Tenderness: There is no abdominal tenderness.  Musculoskeletal:        General: Tenderness (right lateral epicondylitis.  Dupuytren's bilateral hands.  Left worse than right.  Negative Tinel's.  Negative Phalen's.  Decreased range of motion of right shoulder in terms of abduction) present.  Neurological:     Mental Status: She is alert and oriented to person, place, and time.  Psychiatric:        Mood and Affect: Mood normal.        Behavior: Behavior normal.     BP 124/68    Pulse 72    Temp 98.1 F (36.7 C)    Resp 16    Ht 5\' 6"  (1.676 m)    Wt 235 lb (106.6 kg)    BMI 37.93 kg/m  Wt Readings from Last 3 Encounters:  05/14/20 235 lb (106.6 kg)  03/11/20 235 lb 6 oz (106.8 kg)  02/09/20 232 lb (105.2 kg)    Health Maintenance Due  Topic Date Due   Hepatitis C Screening  Never done   COVID-19 Vaccine (1) Never done   TETANUS/TDAP  Never done   DEXA SCAN  Never done   PNA vac Low Risk Adult (1 of 2 - PCV13) Never done   INFLUENZA VACCINE  04/25/2020    There are no preventive care reminders to display for this patient.   No results found for: TSH Lab Results  Component Value Date   WBC 5.0 01/19/2020   HGB 14.2 01/19/2020   HCT 42.3 01/19/2020   MCV 86.3 01/19/2020   PLT 199.0 01/19/2020   Lab Results  Component Value Date   NA 139  01/19/2020   K 4.4 01/19/2020   CO2 30 01/19/2020   GLUCOSE 98 01/19/2020   BUN 14 01/19/2020   CREATININE 0.85 01/19/2020   BILITOT 0.6 01/19/2020   ALKPHOS 77 01/19/2020   AST 19 01/19/2020   ALT 18 01/19/2020   PROT 7.1 01/19/2020   ALBUMIN 4.2 01/19/2020   CALCIUM 9.6 01/19/2020   GFR 65.95 01/19/2020   Lab Results  Component Value Date   CHOL 218 (H) 02/10/2020   Lab Results  Component Value Date   HDL 52 02/10/2020   Lab Results  Component Value Date   LDLCALC 142 (H) 02/10/2020   Lab Results  Component Value Date   TRIG 136 02/10/2020   Lab Results  Component Value Date   CHOLHDL 4.2 02/10/2020   No results found for: HGBA1C     Assessment & Plan:  1. Arthralgia of both hands Start on voltaren gel otc for joint pain.  NO ALEVE.  May use tylenol.  Recommend call hand surgeon.  - CBC with Differential/Platelet - TSH - Sedimentation Rate - C-reactive protein - Rheumatoid factor - CYCLIC CITRUL PEPTIDE ANTIBODY, IGG/IGA - ANA w/Reflex  2. Mixed hyperlipidemia Well controlled.  No changes to medicines.  Continue to work on eating a healthy diet and exercise.  Labs drawn today.  - Comprehensive metabolic panel - Lipid panel  3. Hiatal hernia The current medical regimen is effective;  continue present plan and medications.  4. Gastroesophageal reflux disease without esophagitis The current medical regimen is effective;  continue present plan and medications.  5. Epicondylitis, lateral, right Risks were discussed including bleeding, infection, increase in sugars if diabetic, atrophy at site of injection, and increased pain.  After consent was obtained, using sterile technique the rt lateral proximal forearm was prepped with alcohol.  The area was injected with kenalog 20 mg and 5 ml plain Lidocaine was then injected and the needle withdrawn.  The procedure was well tolerated.   The patient is asked to continue to rest the joint for a few more days  before resuming regular activities.  It may be more painful for the first 1-2 days.  Watch for fever, or increased swelling or persistent pain in the joint. Call or return to clinic prn if such symptoms occur or there is failure to improve as anticipated. - triamcinolone acetonide (KENALOG-40) injection 20 mg   6. Duputren's syndrome BL Hands  I recommended pt call her hand surgeon to follow up.   Recommend call  [place] [facility] to get covid vaccine.   Meds ordered this encounter  Medications   triamcinolone acetonide (KENALOG-40) injection 20 mg    Orders Placed This Encounter  Procedures   CBC with Differential/Platelet   Comprehensive metabolic panel   Lipid panel   TSH   Sedimentation Rate   C-reactive protein   Rheumatoid factor   CYCLIC CITRUL PEPTIDE ANTIBODY, IGG/IGA   ANA w/Reflex     Follow-up: Return in about 3 months (around 08/14/2020) for fasting.  An After Visit Summary was printed and given to the patient.  Rochel Brome [facility] [phone]

## 2020-05-14 NOTE — Patient Instructions (Signed)
Start on voltaren gel otc for joint pain.  NO ALEVE.  May use tylenol.  Recommend call hand surgeon.  Recommend call  [place] [facility] to get covid vaccine.

## 2020-05-16 LAB — LIPID PANEL
Chol/HDL Ratio: 5.1 ratio — ABNORMAL HIGH (ref 0.0–4.4)
Cholesterol, Total: 253 mg/dL — ABNORMAL HIGH (ref 100–199)
HDL: 50 mg/dL (ref >39)
LDL Chol Calc (NIH): 167 mg/dL — ABNORMAL HIGH (ref 0–99)
Triglycerides: 195 mg/dL — ABNORMAL HIGH (ref 0–149)
VLDL Cholesterol Cal: 36 mg/dL (ref 5–40)

## 2020-05-16 LAB — SEDIMENTATION RATE: Sed Rate: 12 mm/hr (ref 0–40)

## 2020-05-16 LAB — CBC WITH DIFFERENTIAL/PLATELET
Basophils Absolute: 0 10*3/uL (ref 0.0–0.2)
Basos: 1 %
EOS (ABSOLUTE): 0.2 10*3/uL (ref 0.0–0.4)
Eos: 4 %
Hematocrit: 41.3 % (ref 34.0–46.6)
Hemoglobin: 14 g/dL (ref 11.1–15.9)
Immature Grans (Abs): 0 10*3/uL (ref 0.0–0.1)
Immature Granulocytes: 0 %
Lymphocytes Absolute: 0.9 10*3/uL (ref 0.7–3.1)
Lymphs: 24 %
MCH: 29.5 pg (ref 26.6–33.0)
MCHC: 33.9 g/dL (ref 31.5–35.7)
MCV: 87 fL (ref 79–97)
Monocytes Absolute: 0.3 10*3/uL (ref 0.1–0.9)
Monocytes: 8 %
Neutrophils Absolute: 2.5 10*3/uL (ref 1.4–7.0)
Neutrophils: 63 %
Platelets: 216 10*3/uL (ref 150–450)
RBC: 4.75 x10E6/uL (ref 3.77–5.28)
RDW: 13.1 % (ref 11.7–15.4)
WBC: 3.9 10*3/uL (ref 3.4–10.8)

## 2020-05-16 LAB — COMPREHENSIVE METABOLIC PANEL
ALT: 19 IU/L (ref 0–32)
AST: 21 IU/L (ref 0–40)
Albumin/Globulin Ratio: 1.7 (ref 1.2–2.2)
Albumin: 4.1 g/dL (ref 3.7–4.7)
Alkaline Phosphatase: 88 IU/L (ref 48–121)
BUN/Creatinine Ratio: 15 (ref 12–28)
BUN: 13 mg/dL (ref 8–27)
Bilirubin Total: 0.5 mg/dL (ref 0.0–1.2)
CO2: 23 mmol/L (ref 20–29)
Calcium: 9.2 mg/dL (ref 8.7–10.3)
Chloride: 103 mmol/L (ref 96–106)
Creatinine, Ser: 0.85 mg/dL (ref 0.57–1.00)
GFR calc Af Amer: 80 mL/min/{1.73_m2} (ref >59)
GFR calc non Af Amer: 69 mL/min/{1.73_m2} (ref >59)
Globulin, Total: 2.4 g/dL (ref 1.5–4.5)
Glucose: 102 mg/dL — ABNORMAL HIGH (ref 65–99)
Potassium: 4.1 mmol/L (ref 3.5–5.2)
Sodium: 139 mmol/L (ref 134–144)
Total Protein: 6.5 g/dL (ref 6.0–8.5)

## 2020-05-16 LAB — RHEUMATOID FACTOR: Rheumatoid fact SerPl-aCnc: 11.3 IU/mL (ref 0.0–13.9)

## 2020-05-16 LAB — CARDIOVASCULAR RISK ASSESSMENT

## 2020-05-16 LAB — ANA W/REFLEX: Anti Nuclear Antibody (ANA): NEGATIVE

## 2020-05-16 LAB — TSH: TSH: 1.8 u[IU]/mL (ref 0.450–4.500)

## 2020-05-16 LAB — CYCLIC CITRUL PEPTIDE ANTIBODY, IGG/IGA: Cyclic Citrullin Peptide Ab: 6 units (ref 0–19)

## 2020-05-16 LAB — C-REACTIVE PROTEIN: CRP: <1 mg/L (ref 0–10)

## 2020-06-07 DIAGNOSIS — K5902 Outlet dysfunction constipation: Secondary | ICD-10-CM | POA: Diagnosis not present

## 2020-06-23 DIAGNOSIS — M50323 Other cervical disc degeneration at C6-C7 level: Secondary | ICD-10-CM | POA: Diagnosis not present

## 2020-06-23 DIAGNOSIS — M546 Pain in thoracic spine: Secondary | ICD-10-CM | POA: Diagnosis not present

## 2020-06-23 DIAGNOSIS — M5136 Other intervertebral disc degeneration, lumbar region: Secondary | ICD-10-CM | POA: Diagnosis not present

## 2020-06-23 DIAGNOSIS — M9903 Segmental and somatic dysfunction of lumbar region: Secondary | ICD-10-CM | POA: Diagnosis not present

## 2020-06-23 DIAGNOSIS — M9904 Segmental and somatic dysfunction of sacral region: Secondary | ICD-10-CM | POA: Diagnosis not present

## 2020-06-23 DIAGNOSIS — M9902 Segmental and somatic dysfunction of thoracic region: Secondary | ICD-10-CM | POA: Diagnosis not present

## 2020-06-23 DIAGNOSIS — M5413 Radiculopathy, cervicothoracic region: Secondary | ICD-10-CM | POA: Diagnosis not present

## 2020-06-23 DIAGNOSIS — M5442 Lumbago with sciatica, left side: Secondary | ICD-10-CM | POA: Diagnosis not present

## 2020-06-23 DIAGNOSIS — M9901 Segmental and somatic dysfunction of cervical region: Secondary | ICD-10-CM | POA: Diagnosis not present

## 2020-06-24 DIAGNOSIS — M5136 Other intervertebral disc degeneration, lumbar region: Secondary | ICD-10-CM | POA: Diagnosis not present

## 2020-06-24 DIAGNOSIS — M5442 Lumbago with sciatica, left side: Secondary | ICD-10-CM | POA: Diagnosis not present

## 2020-06-24 DIAGNOSIS — M9903 Segmental and somatic dysfunction of lumbar region: Secondary | ICD-10-CM | POA: Diagnosis not present

## 2020-06-24 DIAGNOSIS — M5413 Radiculopathy, cervicothoracic region: Secondary | ICD-10-CM | POA: Diagnosis not present

## 2020-06-24 DIAGNOSIS — M50323 Other cervical disc degeneration at C6-C7 level: Secondary | ICD-10-CM | POA: Diagnosis not present

## 2020-06-24 DIAGNOSIS — M546 Pain in thoracic spine: Secondary | ICD-10-CM | POA: Diagnosis not present

## 2020-06-24 DIAGNOSIS — M9904 Segmental and somatic dysfunction of sacral region: Secondary | ICD-10-CM | POA: Diagnosis not present

## 2020-06-24 DIAGNOSIS — M9902 Segmental and somatic dysfunction of thoracic region: Secondary | ICD-10-CM | POA: Diagnosis not present

## 2020-06-24 DIAGNOSIS — M9901 Segmental and somatic dysfunction of cervical region: Secondary | ICD-10-CM | POA: Diagnosis not present

## 2020-06-28 DIAGNOSIS — M9903 Segmental and somatic dysfunction of lumbar region: Secondary | ICD-10-CM | POA: Diagnosis not present

## 2020-06-28 DIAGNOSIS — M9901 Segmental and somatic dysfunction of cervical region: Secondary | ICD-10-CM | POA: Diagnosis not present

## 2020-06-28 DIAGNOSIS — M5442 Lumbago with sciatica, left side: Secondary | ICD-10-CM | POA: Diagnosis not present

## 2020-06-28 DIAGNOSIS — M5136 Other intervertebral disc degeneration, lumbar region: Secondary | ICD-10-CM | POA: Diagnosis not present

## 2020-06-28 DIAGNOSIS — M9904 Segmental and somatic dysfunction of sacral region: Secondary | ICD-10-CM | POA: Diagnosis not present

## 2020-06-28 DIAGNOSIS — M546 Pain in thoracic spine: Secondary | ICD-10-CM | POA: Diagnosis not present

## 2020-06-28 DIAGNOSIS — M9902 Segmental and somatic dysfunction of thoracic region: Secondary | ICD-10-CM | POA: Diagnosis not present

## 2020-06-28 DIAGNOSIS — M50323 Other cervical disc degeneration at C6-C7 level: Secondary | ICD-10-CM | POA: Diagnosis not present

## 2020-06-28 DIAGNOSIS — M5413 Radiculopathy, cervicothoracic region: Secondary | ICD-10-CM | POA: Diagnosis not present

## 2020-06-29 DIAGNOSIS — M9901 Segmental and somatic dysfunction of cervical region: Secondary | ICD-10-CM | POA: Diagnosis not present

## 2020-06-29 DIAGNOSIS — M9902 Segmental and somatic dysfunction of thoracic region: Secondary | ICD-10-CM | POA: Diagnosis not present

## 2020-06-29 DIAGNOSIS — M546 Pain in thoracic spine: Secondary | ICD-10-CM | POA: Diagnosis not present

## 2020-06-29 DIAGNOSIS — M9904 Segmental and somatic dysfunction of sacral region: Secondary | ICD-10-CM | POA: Diagnosis not present

## 2020-06-29 DIAGNOSIS — M50323 Other cervical disc degeneration at C6-C7 level: Secondary | ICD-10-CM | POA: Diagnosis not present

## 2020-06-29 DIAGNOSIS — M5413 Radiculopathy, cervicothoracic region: Secondary | ICD-10-CM | POA: Diagnosis not present

## 2020-06-29 DIAGNOSIS — M5442 Lumbago with sciatica, left side: Secondary | ICD-10-CM | POA: Diagnosis not present

## 2020-06-29 DIAGNOSIS — M5136 Other intervertebral disc degeneration, lumbar region: Secondary | ICD-10-CM | POA: Diagnosis not present

## 2020-06-29 DIAGNOSIS — M9903 Segmental and somatic dysfunction of lumbar region: Secondary | ICD-10-CM | POA: Diagnosis not present

## 2020-07-01 DIAGNOSIS — M5442 Lumbago with sciatica, left side: Secondary | ICD-10-CM | POA: Diagnosis not present

## 2020-07-01 DIAGNOSIS — M50323 Other cervical disc degeneration at C6-C7 level: Secondary | ICD-10-CM | POA: Diagnosis not present

## 2020-07-01 DIAGNOSIS — M9903 Segmental and somatic dysfunction of lumbar region: Secondary | ICD-10-CM | POA: Diagnosis not present

## 2020-07-01 DIAGNOSIS — M546 Pain in thoracic spine: Secondary | ICD-10-CM | POA: Diagnosis not present

## 2020-07-01 DIAGNOSIS — M9902 Segmental and somatic dysfunction of thoracic region: Secondary | ICD-10-CM | POA: Diagnosis not present

## 2020-07-01 DIAGNOSIS — M5413 Radiculopathy, cervicothoracic region: Secondary | ICD-10-CM | POA: Diagnosis not present

## 2020-07-01 DIAGNOSIS — M9901 Segmental and somatic dysfunction of cervical region: Secondary | ICD-10-CM | POA: Diagnosis not present

## 2020-07-01 DIAGNOSIS — M5136 Other intervertebral disc degeneration, lumbar region: Secondary | ICD-10-CM | POA: Diagnosis not present

## 2020-07-01 DIAGNOSIS — M9904 Segmental and somatic dysfunction of sacral region: Secondary | ICD-10-CM | POA: Diagnosis not present

## 2020-07-08 DIAGNOSIS — M9901 Segmental and somatic dysfunction of cervical region: Secondary | ICD-10-CM | POA: Diagnosis not present

## 2020-07-08 DIAGNOSIS — M5442 Lumbago with sciatica, left side: Secondary | ICD-10-CM | POA: Diagnosis not present

## 2020-07-08 DIAGNOSIS — M546 Pain in thoracic spine: Secondary | ICD-10-CM | POA: Diagnosis not present

## 2020-07-08 DIAGNOSIS — M5136 Other intervertebral disc degeneration, lumbar region: Secondary | ICD-10-CM | POA: Diagnosis not present

## 2020-07-08 DIAGNOSIS — M9902 Segmental and somatic dysfunction of thoracic region: Secondary | ICD-10-CM | POA: Diagnosis not present

## 2020-07-08 DIAGNOSIS — M50323 Other cervical disc degeneration at C6-C7 level: Secondary | ICD-10-CM | POA: Diagnosis not present

## 2020-07-08 DIAGNOSIS — M9903 Segmental and somatic dysfunction of lumbar region: Secondary | ICD-10-CM | POA: Diagnosis not present

## 2020-07-08 DIAGNOSIS — M5413 Radiculopathy, cervicothoracic region: Secondary | ICD-10-CM | POA: Diagnosis not present

## 2020-07-08 DIAGNOSIS — M9904 Segmental and somatic dysfunction of sacral region: Secondary | ICD-10-CM | POA: Diagnosis not present

## 2020-07-09 DIAGNOSIS — M5442 Lumbago with sciatica, left side: Secondary | ICD-10-CM | POA: Diagnosis not present

## 2020-07-09 DIAGNOSIS — M5413 Radiculopathy, cervicothoracic region: Secondary | ICD-10-CM | POA: Diagnosis not present

## 2020-07-09 DIAGNOSIS — M9901 Segmental and somatic dysfunction of cervical region: Secondary | ICD-10-CM | POA: Diagnosis not present

## 2020-07-09 DIAGNOSIS — M9904 Segmental and somatic dysfunction of sacral region: Secondary | ICD-10-CM | POA: Diagnosis not present

## 2020-07-09 DIAGNOSIS — M546 Pain in thoracic spine: Secondary | ICD-10-CM | POA: Diagnosis not present

## 2020-07-09 DIAGNOSIS — M9902 Segmental and somatic dysfunction of thoracic region: Secondary | ICD-10-CM | POA: Diagnosis not present

## 2020-07-09 DIAGNOSIS — M5136 Other intervertebral disc degeneration, lumbar region: Secondary | ICD-10-CM | POA: Diagnosis not present

## 2020-07-09 DIAGNOSIS — M50323 Other cervical disc degeneration at C6-C7 level: Secondary | ICD-10-CM | POA: Diagnosis not present

## 2020-07-09 DIAGNOSIS — M9903 Segmental and somatic dysfunction of lumbar region: Secondary | ICD-10-CM | POA: Diagnosis not present

## 2020-07-13 DIAGNOSIS — Z20828 Contact with and (suspected) exposure to other viral communicable diseases: Secondary | ICD-10-CM | POA: Diagnosis not present

## 2020-07-13 DIAGNOSIS — M9902 Segmental and somatic dysfunction of thoracic region: Secondary | ICD-10-CM | POA: Diagnosis not present

## 2020-07-13 DIAGNOSIS — M5413 Radiculopathy, cervicothoracic region: Secondary | ICD-10-CM | POA: Diagnosis not present

## 2020-07-13 DIAGNOSIS — M5136 Other intervertebral disc degeneration, lumbar region: Secondary | ICD-10-CM | POA: Diagnosis not present

## 2020-07-13 DIAGNOSIS — M9904 Segmental and somatic dysfunction of sacral region: Secondary | ICD-10-CM | POA: Diagnosis not present

## 2020-07-13 DIAGNOSIS — M50323 Other cervical disc degeneration at C6-C7 level: Secondary | ICD-10-CM | POA: Diagnosis not present

## 2020-07-13 DIAGNOSIS — M5442 Lumbago with sciatica, left side: Secondary | ICD-10-CM | POA: Diagnosis not present

## 2020-07-13 DIAGNOSIS — M9901 Segmental and somatic dysfunction of cervical region: Secondary | ICD-10-CM | POA: Diagnosis not present

## 2020-07-13 DIAGNOSIS — M9903 Segmental and somatic dysfunction of lumbar region: Secondary | ICD-10-CM | POA: Diagnosis not present

## 2020-07-13 DIAGNOSIS — M546 Pain in thoracic spine: Secondary | ICD-10-CM | POA: Diagnosis not present

## 2020-07-14 DIAGNOSIS — M9902 Segmental and somatic dysfunction of thoracic region: Secondary | ICD-10-CM | POA: Diagnosis not present

## 2020-07-14 DIAGNOSIS — M9903 Segmental and somatic dysfunction of lumbar region: Secondary | ICD-10-CM | POA: Diagnosis not present

## 2020-07-14 DIAGNOSIS — M9904 Segmental and somatic dysfunction of sacral region: Secondary | ICD-10-CM | POA: Diagnosis not present

## 2020-07-14 DIAGNOSIS — M5442 Lumbago with sciatica, left side: Secondary | ICD-10-CM | POA: Diagnosis not present

## 2020-07-14 DIAGNOSIS — M546 Pain in thoracic spine: Secondary | ICD-10-CM | POA: Diagnosis not present

## 2020-07-14 DIAGNOSIS — M5413 Radiculopathy, cervicothoracic region: Secondary | ICD-10-CM | POA: Diagnosis not present

## 2020-07-14 DIAGNOSIS — M50323 Other cervical disc degeneration at C6-C7 level: Secondary | ICD-10-CM | POA: Diagnosis not present

## 2020-07-14 DIAGNOSIS — M9901 Segmental and somatic dysfunction of cervical region: Secondary | ICD-10-CM | POA: Diagnosis not present

## 2020-07-14 DIAGNOSIS — M5136 Other intervertebral disc degeneration, lumbar region: Secondary | ICD-10-CM | POA: Diagnosis not present

## 2020-07-15 DIAGNOSIS — M9901 Segmental and somatic dysfunction of cervical region: Secondary | ICD-10-CM | POA: Diagnosis not present

## 2020-07-15 DIAGNOSIS — M9902 Segmental and somatic dysfunction of thoracic region: Secondary | ICD-10-CM | POA: Diagnosis not present

## 2020-07-15 DIAGNOSIS — M546 Pain in thoracic spine: Secondary | ICD-10-CM | POA: Diagnosis not present

## 2020-07-15 DIAGNOSIS — M9904 Segmental and somatic dysfunction of sacral region: Secondary | ICD-10-CM | POA: Diagnosis not present

## 2020-07-15 DIAGNOSIS — M50323 Other cervical disc degeneration at C6-C7 level: Secondary | ICD-10-CM | POA: Diagnosis not present

## 2020-07-15 DIAGNOSIS — L304 Erythema intertrigo: Secondary | ICD-10-CM | POA: Diagnosis not present

## 2020-07-15 DIAGNOSIS — D485 Neoplasm of uncertain behavior of skin: Secondary | ICD-10-CM | POA: Diagnosis not present

## 2020-07-15 DIAGNOSIS — M5442 Lumbago with sciatica, left side: Secondary | ICD-10-CM | POA: Diagnosis not present

## 2020-07-15 DIAGNOSIS — M5136 Other intervertebral disc degeneration, lumbar region: Secondary | ICD-10-CM | POA: Diagnosis not present

## 2020-07-15 DIAGNOSIS — M9903 Segmental and somatic dysfunction of lumbar region: Secondary | ICD-10-CM | POA: Diagnosis not present

## 2020-07-15 DIAGNOSIS — M5413 Radiculopathy, cervicothoracic region: Secondary | ICD-10-CM | POA: Diagnosis not present

## 2020-07-20 DIAGNOSIS — M50323 Other cervical disc degeneration at C6-C7 level: Secondary | ICD-10-CM | POA: Diagnosis not present

## 2020-07-20 DIAGNOSIS — M546 Pain in thoracic spine: Secondary | ICD-10-CM | POA: Diagnosis not present

## 2020-07-20 DIAGNOSIS — M9903 Segmental and somatic dysfunction of lumbar region: Secondary | ICD-10-CM | POA: Diagnosis not present

## 2020-07-20 DIAGNOSIS — M5136 Other intervertebral disc degeneration, lumbar region: Secondary | ICD-10-CM | POA: Diagnosis not present

## 2020-07-20 DIAGNOSIS — M5413 Radiculopathy, cervicothoracic region: Secondary | ICD-10-CM | POA: Diagnosis not present

## 2020-07-20 DIAGNOSIS — M9902 Segmental and somatic dysfunction of thoracic region: Secondary | ICD-10-CM | POA: Diagnosis not present

## 2020-07-20 DIAGNOSIS — M9904 Segmental and somatic dysfunction of sacral region: Secondary | ICD-10-CM | POA: Diagnosis not present

## 2020-07-20 DIAGNOSIS — M5442 Lumbago with sciatica, left side: Secondary | ICD-10-CM | POA: Diagnosis not present

## 2020-07-20 DIAGNOSIS — M9901 Segmental and somatic dysfunction of cervical region: Secondary | ICD-10-CM | POA: Diagnosis not present

## 2020-07-22 DIAGNOSIS — M5136 Other intervertebral disc degeneration, lumbar region: Secondary | ICD-10-CM | POA: Diagnosis not present

## 2020-07-22 DIAGNOSIS — M5442 Lumbago with sciatica, left side: Secondary | ICD-10-CM | POA: Diagnosis not present

## 2020-07-22 DIAGNOSIS — M50323 Other cervical disc degeneration at C6-C7 level: Secondary | ICD-10-CM | POA: Diagnosis not present

## 2020-07-22 DIAGNOSIS — M5413 Radiculopathy, cervicothoracic region: Secondary | ICD-10-CM | POA: Diagnosis not present

## 2020-07-22 DIAGNOSIS — M9904 Segmental and somatic dysfunction of sacral region: Secondary | ICD-10-CM | POA: Diagnosis not present

## 2020-07-22 DIAGNOSIS — M9901 Segmental and somatic dysfunction of cervical region: Secondary | ICD-10-CM | POA: Diagnosis not present

## 2020-07-22 DIAGNOSIS — M546 Pain in thoracic spine: Secondary | ICD-10-CM | POA: Diagnosis not present

## 2020-07-22 DIAGNOSIS — M9902 Segmental and somatic dysfunction of thoracic region: Secondary | ICD-10-CM | POA: Diagnosis not present

## 2020-07-22 DIAGNOSIS — M9903 Segmental and somatic dysfunction of lumbar region: Secondary | ICD-10-CM | POA: Diagnosis not present

## 2020-08-04 ENCOUNTER — Telehealth: Payer: Self-pay | Admitting: Family Medicine

## 2020-08-04 NOTE — Progress Notes (Signed)
  Chronic Care Management   Outreach Note  08/04/2020 Name: Patricia Leonard MRN: [id number] DOB: 12-01-1950  Referred by: Rochel Brome, MD Reason for referral : Chronic Care Management   An unsuccessful telephone outreach was attempted today. The patient was referred to the pharmacist for assistance with care management and care coordination.   Follow Up Plan:   Hilario Quarry  Upstream Scheduler

## 2020-08-23 ENCOUNTER — Ambulatory Visit (INDEPENDENT_AMBULATORY_CARE_PROVIDER_SITE_OTHER): Payer: PPO

## 2020-08-23 ENCOUNTER — Other Ambulatory Visit: Payer: Self-pay

## 2020-08-23 ENCOUNTER — Encounter: Payer: Self-pay | Admitting: Family Medicine

## 2020-08-23 ENCOUNTER — Ambulatory Visit (INDEPENDENT_AMBULATORY_CARE_PROVIDER_SITE_OTHER): Payer: PPO | Admitting: Family Medicine

## 2020-08-23 VITALS — BP 124/90 | HR 68 | Ht 66.0 in | Wt 235.0 lb

## 2020-08-23 DIAGNOSIS — M25511 Pain in right shoulder: Secondary | ICD-10-CM

## 2020-08-23 DIAGNOSIS — M542 Cervicalgia: Secondary | ICD-10-CM

## 2020-08-23 DIAGNOSIS — G8929 Other chronic pain: Secondary | ICD-10-CM | POA: Diagnosis not present

## 2020-08-23 DIAGNOSIS — M5010 Cervical disc disorder with radiculopathy, unspecified cervical region: Secondary | ICD-10-CM | POA: Diagnosis not present

## 2020-08-23 DIAGNOSIS — M5416 Radiculopathy, lumbar region: Secondary | ICD-10-CM | POA: Diagnosis not present

## 2020-08-23 DIAGNOSIS — M5136 Other intervertebral disc degeneration, lumbar region: Secondary | ICD-10-CM | POA: Diagnosis not present

## 2020-08-23 DIAGNOSIS — M545 Low back pain, unspecified: Secondary | ICD-10-CM | POA: Diagnosis not present

## 2020-08-23 DIAGNOSIS — M50322 Other cervical disc degeneration at C5-C6 level: Secondary | ICD-10-CM | POA: Diagnosis not present

## 2020-08-23 MED ORDER — PREDNISONE 20 MG PO TABS: 20.0000 mg | ORAL_TABLET | Freq: Every day | ORAL | 0 refills | Status: DC

## 2020-08-23 MED ORDER — GABAPENTIN 100 MG PO CAPS
200.0000 mg | ORAL_CAPSULE | Freq: Every day | ORAL | 3 refills | Status: DC
Start: 2020-08-23 — End: 2021-05-26

## 2020-08-23 NOTE — Patient Instructions (Addendum)
Xrays today Good to see you.  Ice 20 minutes 2 times daily. Usually after activity and before bed. Exercises 3 times a week.  Gabapentin 200 mg at night but can start with just 100mg  at night if you want first  Tart cherry extract 1200mg  at night Vitamin D 2000 IU daily  See me again in 4 weeks

## 2020-08-23 NOTE — Progress Notes (Signed)
[place] Phone: [phone] Subjective:   [name], am serving as a scribe for Dr. Hulan Saas. This visit occurred during the SARS-CoV-2 public health emergency.  Safety protocols were in place, including screening questions prior to the visit, additional usage of staff PPE, and extensive cleaning of exam room while observing appropriate contact time as indicated for disinfecting solutions.   I'm seeing this patient by the request  of:  Cox, Kirsten, MD  CC: Neck and back pain  [id number]  IYESHA SUCH is a 69 y.o. female coming in with complaint of back, shoulder and hand pain. Patient states that she has been having tingling in right arm and fingers as well as pain in right shoulder. Patient went to a chiropractor for shoulder and neck pain and ended up having pain in lumbar spine. Is now having cramping during the day and at night. Pain becomes worse throughout the day. Is also having tingling into posterior aspect of right leg. Patient does have history of spinal stenosis and DDD of lumbar spine. Also has had epidural years ago. History of osteoarthritis. Has hiatal hernia and stomach ulcers so is only able to take Tylenol. Does use Aleve if in a lot of pain.      Past Medical History:  Diagnosis Date  . Allergy   . Anemia   . Anxiety   . Aortic atherosclerosis ([place])   . Arthritis   . Cataract   . Depression   . Family history of colon cancer   . Fibromyalgia   . GERD (gastroesophageal reflux disease)   . Hyperlipidemia   . IBS (irritable bowel syndrome)   . Rectal prolapse   . Stomach ulcer   . Tick bite    took antibiotics memorial day weekend 2021 on back   Past Surgical History:  Procedure Laterality Date  . APPENDECTOMY    . CESAREAN SECTION    . CHOLECYSTECTOMY    . COLONOSCOPY  04/18/2014   Diverticulosis.   Marland Kitchen ESOPHAGOGASTRODUODENOSCOPY  04/14/2016   Schatzkis ring. Hiatal  hernia. Gastritis.   Marland Kitchen LIPOMA EXCISION    . thermal ablation    . TONSILLECTOMY     Social History   Socioeconomic History  . Marital status: Widowed    Spouse name: Not on file  . Number of children: Not on file  . Years of education: Not on file  . Highest education level: Not on file  Occupational History  . Not on file  Tobacco Use  . Smoking status: Never Smoker  . Smokeless tobacco: Never Used  Vaping Use  . Vaping Use: Never used  Substance and Sexual Activity  . Alcohol use: Never  . Drug use: Never  . Sexual activity: Not on file  Other Topics Concern  . Not on file  Social History Narrative  . Not on file   Social Determinants of Health   Financial Resource Strain:   . Difficulty of Paying Living Expenses: Not on file  Food Insecurity:   . Worried About [employer] fund[place] in the Last Year: Not on file  . Ran Out of Food in the Last Year: Not on file  Transportation Needs:   . Lack of Transportation (Medical): Not on file  . Lack of Transportation (Non-Medical): Not on file  Physical Activity:   . Days of Exercise per Week: Not on file  . Minutes of Exercise per Session: Not on file  Stress:   . Feeling of Stress : Not on file  Social Connections:   . Frequency of Communication with Friends and Family: Not on file  . Frequency of Social Gatherings with Friends and Family: Not on file  . Attends Religious Services: Not on file  . Active Member of Clubs or Organizations: Not on file  . Attends Archivist Meetings: Not on file  . Marital Status: Not on file   Allergies  Allergen Reactions  . Amoxicillin-Pot Clavulanate     Stomach pain   . Iohexol      Desc: HAD FACIAL RASH WITH CT CONTRAST IN 2006    Family History  Problem Relation Age of Onset  . Colon cancer Mother   . Liver cancer Father   . CAD Father   . Hypertension Sister   . Colon cancer Sister   . Hyperlipidemia Brother   . Gout Brother   . Diabetes Maternal Grandmother    . Diabetes Paternal Grandmother   . Heart attack Other   . Cancer Other        Leiomyosarcome and Liver  . Migraines Other   . Cancer Maternal Aunt        "female cancer, not breast"  . Cancer Paternal Aunt        "female cancer, not breast"  . Colon polyps Neg Hx   . Esophageal cancer Neg Hx   . Pancreatic cancer Neg Hx   . Rectal cancer Neg Hx   . Stomach cancer Neg Hx     Current Outpatient Medications (Endocrine & Metabolic):  .  predniSONE (DELTASONE) 20 MG tablet, Take 1 tablet (20 mg total) by mouth daily with breakfast.  Current Outpatient Medications (Cardiovascular):  .  rosuvastatin (CRESTOR) 10 MG tablet, TAKE 1 TABLET BY MOUTH EVERY DAY     Current Outpatient Medications (Other):  .  clidinium-chlordiazePOXIDE (LIBRAX) 5-2.5 MG capsule, Take 1 capsule by mouth 3 (three) times daily before meals. .  dicyclomine (BENTYL) 10 MG capsule, Take 1 capsule (10 mg total) by mouth 3 (three) times daily before meals. As needed .  omeprazole (PRILOSEC) 40 MG capsule, Take 1 capsule (40 mg total) by mouth in the morning and at bedtime. .  gabapentin (NEURONTIN) 100 MG capsule, Take 2 capsules (200 mg total) by mouth at bedtime.   Reviewed prior external information including notes and imaging from  primary care provider As well as notes that were available from care everywhere and other healthcare systems.  Past medical history, social, surgical and family history all reviewed in electronic medical record.  No pertanent information unless stated regarding to the chief complaint.   Review of Systems:  No headache, visual changes, nausea, vomiting, diarrhea, constipation, dizziness, abdominal pain, skin rash, fevers, chills, night sweats, weight loss, swollen lymph nodes,  joint swelling, chest pain, shortness of breath, mood changes. POSITIVE muscle aches, body aches  Objective  Blood pressure 124/90, pulse 68, height 5\' 6"  (1.676 m), weight 235 lb (106.6 kg), SpO2 97 %.     General: No apparent distress alert and oriented x3 mood and affect normal, dressed appropriately.  HEENT: Pupils equal, extraocular movements intact  Respiratory: Patient's speak in full sentences and does not appear short of breath  Cardiovascular: No lower extremity edema, non tender, no erythema  Low back exam does have some mild loss of lordosis.  Patient does have some degenerative scoliosis noted of the lumbar spine.   Difficult to do secondary to pain.  Mild positive straight leg test at 30 degrees of forward flexion of the right leg in the L4 and L5 distributions.  No weakness of the foot.  Deep tendon reflexes do appear to be intact  Neck exam does have some mild crepitus with range of motion.  Negative Spurling's.  Patient lacks 10 degrees of sidebending bilaterally and lacks the last 10 degrees of extension of the neck.  Lacks last 5 degrees of flexion  .[id number]; 15 additional minutes spent for Therapeutic exercises as stated in above notes.  This included exercises focusing on stretching, strengthening, with significant focus on eccentric aspects.   Long term goals include an improvement in range of motion, strength, endurance as well as avoiding reinjury. Patient's frequency would include in 1-2 times a day, 3-5 times a week for a duration of 6-12 weeks. Low back exercises that included:  Pelvic tilt/bracing instruction to focus on control of the pelvic girdle and lower abdominal muscles  Glute strengthening exercises, focusing on proper firing of the glutes without engaging the low back muscles Proper stretching techniques for maximum relief for the hamstrings, hip flexors, low back and some rotation where tolerated   Proper technique shown and discussed handout in great detail with ATC.  All questions were discussed and answered.      Impression and Recommendations:     The above documentation has been reviewed and is accurate and complete Lyndal Pulley, DO

## 2020-08-23 NOTE — Assessment & Plan Note (Signed)
Moderate to severe.  Discussed icing regimen and home exercises.  Gabapentin and prednisone given.  Discussed icing regimen.  Given and work with Product/process development scientist.  X-rays are pending.  Worsening pain MRI may be necessary.  Patient has responded well to an epidural of the lumbar spine previously.  If improvement will consider the possibility of osteopathic manipulation but will hold at this time follow-up again in 4 weeks

## 2020-08-23 NOTE — Assessment & Plan Note (Signed)
History of back pain previously 12 years ago that responded well to 1 epidural.  Patient likely has some progression of the arthritic changes and x-rays are pending.  Prednisone given but will keep a low dose secondary to patient's IBS of 20 mg daily for the next 7 days.  Gabapentin 200 mg at night and warned of potential side effects.  Home exercises and core stability.  Follow-up again in 4 to 6 weeks

## 2020-09-22 NOTE — Progress Notes (Signed)
[place] [address] [place] Phone: [phone] Subjective:   I [name] am serving as a Education administrator for Dr. Hulan Saas.   This visit occurred during the SARS-CoV-2 public health emergency.  Safety protocols were in place, including screening questions prior to the visit, additional usage of staff PPE, and extensive cleaning of exam room while observing appropriate contact time as indicated for disinfecting solutions.   I'm seeing this patient by the request  of:  Cox, Elnita Maxwell, MD  CC: Back pain follow-up, neck pain follow-up, new onset hip pain  [id number]   11/29/20021 History of back pain previously 12 years ago that responded well to 1 epidural.  Patient likely has some progression of the arthritic changes and x-rays are pending.  Prednisone given but will keep a low dose secondary to patient's IBS of 20 mg daily for the next 7 days.  Gabapentin 200 mg at night and warned of potential side effects.  Home exercises and core stability.  Follow-up again in 4 to 6 weeks  Moderate to severe.  Discussed icing regimen and home exercises.  Gabapentin and prednisone given.  Discussed icing regimen.  Given and work with Product/process development scientist.  X-rays are pending.  Worsening pain MRI may be necessary.  Patient has responded well to an epidural of the lumbar spine previously.  If improvement will consider the possibility of osteopathic manipulation but will hold at this time follow-up again in 4 weeks  Update 09/23/2020 SHANTI CASIO is a 69 y.o. female coming in with complaint of back and neck pain. Patient states she believes she is making progress and wants to talk through a few things. Cramping in the right foot since her last visit. Cramps are not as bad now. Being on her feet a few hours at a time made the pain worse. Right hip is painful with sleeping. States she is getting only a few hours of sleep. Feels like a toothache. Not bad with  standing mostly pain with laying down. Pain radiates to her groin. Pain also in the hamstring on the right side. Numbness and tingling bilaterally in the legs. Right arm bothers her with lifting.    Patient was to start gabapentin at last exam.   X-rays at last exam on November 30 were independently visualized by me.  Patient's lumbar spine shows mild degenerative disc disease but otherwise fairly unremarkable  Right shoulder exam unremarkable  Cervical spinal does show moderate degenerative disc disease from C4-C7  Past Medical History:  Diagnosis Date  . Allergy   . Anemia   . Anxiety   . Aortic atherosclerosis ([place])   . Arthritis   . Cataract   . Depression   . Family history of colon cancer   . Fibromyalgia   . GERD (gastroesophageal reflux disease)   . Hyperlipidemia   . IBS (irritable bowel syndrome)   . Rectal prolapse   . Stomach ulcer   . Tick bite    took antibiotics memorial day weekend 2021 on back   Past Surgical History:  Procedure Laterality Date  . APPENDECTOMY    . CESAREAN SECTION    . CHOLECYSTECTOMY    . COLONOSCOPY  04/18/2014   Diverticulosis.   Marland Kitchen ESOPHAGOGASTRODUODENOSCOPY  04/14/2016   Schatzkis ring. Hiatal hernia. Gastritis.   Marland Kitchen LIPOMA EXCISION    . thermal ablation    . TONSILLECTOMY     Social History   Socioeconomic History  . Marital status: Widowed  Spouse name: Not on file  . Number of children: Not on file  . Years of education: Not on file  . Highest education level: Not on file  Occupational History  . Not on file  Tobacco Use  . Smoking status: Never Smoker  . Smokeless tobacco: Never Used  Vaping Use  . Vaping Use: Never used  Substance and Sexual Activity  . Alcohol use: Never  . Drug use: Never  . Sexual activity: Not on file  Other Topics Concern  . Not on file  Social History Narrative  . Not on file   Social Determinants of Health   Financial Resource Strain: Not on file  Food Insecurity: Not on file   Transportation Needs: Not on file  Physical Activity: Not on file  Stress: Not on file  Social Connections: Not on file   Allergies  Allergen Reactions  . Amoxicillin-Pot Clavulanate     Stomach pain   . Iohexol      Desc: HAD FACIAL RASH WITH CT CONTRAST IN 2006    Family History  Problem Relation Age of Onset  . Colon cancer Mother   . Liver cancer Father   . CAD Father   . Hypertension Sister   . Colon cancer Sister   . Hyperlipidemia Brother   . Gout Brother   . Diabetes Maternal Grandmother   . Diabetes Paternal Grandmother   . Heart attack Other   . Cancer Other        Leiomyosarcome and Liver  . Migraines Other   . Cancer Maternal Aunt        "female cancer, not breast"  . Cancer Paternal Aunt        "female cancer, not breast"  . Colon polyps Neg Hx   . Esophageal cancer Neg Hx   . Pancreatic cancer Neg Hx   . Rectal cancer Neg Hx   . Stomach cancer Neg Hx     Current Outpatient Medications (Endocrine & Metabolic):  .  predniSONE (DELTASONE) 20 MG tablet, Take 1 tablet (20 mg total) by mouth daily with breakfast.  Current Outpatient Medications (Cardiovascular):  .  rosuvastatin (CRESTOR) 10 MG tablet, TAKE 1 TABLET BY MOUTH EVERY DAY     Current Outpatient Medications (Other):  .  clidinium-chlordiazePOXIDE (LIBRAX) 5-2.5 MG capsule, Take 1 capsule by mouth 3 (three) times daily before meals. .  dicyclomine (BENTYL) 10 MG capsule, Take 1 capsule (10 mg total) by mouth 3 (three) times daily before meals. As needed .  gabapentin (NEURONTIN) 100 MG capsule, Take 2 capsules (200 mg total) by mouth at bedtime. Marland Kitchen  omeprazole (PRILOSEC) 40 MG capsule, Take 1 capsule (40 mg total) by mouth in the morning and at bedtime.   Reviewed prior external information including notes and imaging from  primary care provider As well as notes that were available from care everywhere and other healthcare systems.  Past medical history, social, surgical and family  history all reviewed in electronic medical record.  No pertanent information unless stated regarding to the chief complaint.   Review of Systems:  No headache, visual changes, nausea, vomiting, diarrhea, constipation, dizziness, abdominal pain,  joint swelling, chest pain, shortness of breath, mood changes. POSITIVE muscle aches, body aches and muscle cramps  Objective  Blood pressure (!) 152/90, pulse 65, height 5\' 6"  (1.676 m), weight 236 lb (107 kg), SpO2 97 %.   General: No apparent distress alert and oriented x3 mood and affect normal, dressed appropriately.  Mild tremor  in voice HEENT: Pupils equal, extraocular movements intact  Respiratory: Patient's speak in full sentences and does not appear short of breath  Cardiovascular: No lower extremity edema, non tender, no erythema  Patient does have tenderness to palpation over the right greater trochanter is fairly severe.  Mild positive Pearlean Brownie.  Minimal pain over the gluteal tendon.  Mild pain over the sacroiliac joint.  Negative straight leg test today.  Does have tightness with this hamstring though.  Mild tenderness in the paraspinal musculature deep tendon reflexes are intact. Neck exam patient seems to still have some mild limited range of motion.  Mild tightness of the neck.  Negative Spurling's.  After verbal consent patient was prepped with alcohol swabs and with a 21-gauge 2 inch needle injected into the right greater trochanteric area with a total of 2 cc of 0.5% Marcaine and 1 cc of Kenalog 40 mg/mL.  No blood loss.  Band-Aid placed.  Postinjection instructions given    Impression and Recommendations:     The above documentation has been reviewed and is accurate and complete Judi Saa, DO

## 2020-09-23 ENCOUNTER — Encounter: Payer: Self-pay | Admitting: Family Medicine

## 2020-09-23 ENCOUNTER — Other Ambulatory Visit: Payer: Self-pay

## 2020-09-23 ENCOUNTER — Ambulatory Visit (INDEPENDENT_AMBULATORY_CARE_PROVIDER_SITE_OTHER): Payer: PPO

## 2020-09-23 ENCOUNTER — Ambulatory Visit (INDEPENDENT_AMBULATORY_CARE_PROVIDER_SITE_OTHER): Payer: PPO | Admitting: Family Medicine

## 2020-09-23 VITALS — BP 152/90 | HR 65 | Ht 66.0 in | Wt 236.0 lb

## 2020-09-23 DIAGNOSIS — M5416 Radiculopathy, lumbar region: Secondary | ICD-10-CM

## 2020-09-23 DIAGNOSIS — M7061 Trochanteric bursitis, right hip: Secondary | ICD-10-CM | POA: Diagnosis not present

## 2020-09-23 DIAGNOSIS — M25551 Pain in right hip: Secondary | ICD-10-CM | POA: Diagnosis not present

## 2020-09-23 DIAGNOSIS — M5010 Cervical disc disorder with radiculopathy, unspecified cervical region: Secondary | ICD-10-CM | POA: Diagnosis not present

## 2020-09-23 NOTE — Patient Instructions (Addendum)
Good to see you Hip xray today [facility] I think they can help you with exercise and maybe use their gym Hopefully the hip injection helped B complex 1000 mcg of B12 100 mg of B6  Take vitamin C with iron containing meals See me again in 6-8 weeks

## 2020-09-23 NOTE — Assessment & Plan Note (Signed)
Severe arthritic changes as well.  We will have physical therapy work.  Gabapentin continued.  Continue with posture and ergonomics.  Worsening symptoms advanced imaging would be warranted but I hope patient will continue to respond to conservative therapy

## 2020-09-23 NOTE — Assessment & Plan Note (Signed)
Patient given injection today tolerated the procedure well.  We discussed likely more secondary to inflammation and patient's underlying fibromyalgia.  We will start with formal physical therapy for this as well as low back.  Continue the gabapentin.  Worsening pain will consider advanced imaging of the back.  Follow-up with me again in 6 to 8 weeks

## 2020-09-23 NOTE — Assessment & Plan Note (Signed)
Patient has responded fairly well to the home exercises as well as the gabapentin.  Discussed mobility feel physical therapy would be beneficial and will be referred today as well.  Discussed icing regimen and home exercises.  We will continue all the other conservative therapy follow-up with me again 2 months

## 2020-09-27 ENCOUNTER — Telehealth: Payer: Self-pay | Admitting: Family Medicine

## 2020-09-27 NOTE — Chronic Care Management (AMB) (Signed)
  Chronic Care Management   Note  09/27/2020 Name: Patricia Leonard MRN: [id number] DOB: 1951/08/03  Patricia Leonard is a 70 y.o. year old female who is a primary care patient of Cox, Kirsten, MD. I reached out to Jimmie Molly by phone today in response to a referral sent by Ms. [name]'s PCP, Cox, Kirsten, MD.   [name] was given information about Chronic Care Management services today including:  1. CCM service includes personalized support from designated clinical staff supervised by her physician, including individualized plan of care and coordination with other care providers 2. 24/7 contact phone numbers for assistance for urgent and routine care needs. 3. Service will only be billed when office clinical staff spend 20 minutes or more in a month to coordinate care. 4. Only one practitioner may furnish and bill the service in a calendar month. 5. The patient may stop CCM services at any time (effective at the end of the month) by phone call to the office staff.   Patient agreed to services and verbal consent obtained.   Follow up plan:   [name]  [employer]stream Scheduler

## 2020-10-04 DIAGNOSIS — R260 Ataxic gait: Secondary | ICD-10-CM | POA: Diagnosis not present

## 2020-10-04 DIAGNOSIS — M6281 Muscle weakness (generalized): Secondary | ICD-10-CM | POA: Diagnosis not present

## 2020-10-11 ENCOUNTER — Other Ambulatory Visit: Payer: Self-pay

## 2020-10-11 ENCOUNTER — Encounter: Payer: Self-pay | Admitting: Family Medicine

## 2020-10-11 ENCOUNTER — Telehealth (INDEPENDENT_AMBULATORY_CARE_PROVIDER_SITE_OTHER): Payer: PPO | Admitting: Family Medicine

## 2020-10-11 VITALS — Temp 97.0°F

## 2020-10-11 DIAGNOSIS — J069 Acute upper respiratory infection, unspecified: Secondary | ICD-10-CM

## 2020-10-11 NOTE — Progress Notes (Signed)
Virtual Visit via Telephone Note   This visit type was conducted due to national recommendations for restrictions regarding the COVID-19 Pandemic (e.g. social distancing) in an effort to limit this patient's exposure and mitigate transmission in our community.  Due to her co-morbid illnesses, this patient is at least at moderate risk for complications without adequate follow up.  This format is felt to be most appropriate for this patient at this time.  The patient did not have access to video technology/had technical difficulties with video requiring transitioning to audio format only (telephone).  All issues noted in this document were discussed and addressed.  No physical exam could be performed with this format.  Patient verbally consented to a telehealth visit.   Date:  10/11/2020   ID:  [name], DOB 02-Dec-1950, MRN [id number]  Patient Location: Home Provider Location: Other:  [facility]  PCP:  No primary care provider on file.   Evaluation Performed:acute Chief Complaint: cough  History of Present Illness:    Patricia Leonard is a 70 y.o. female presents complaining of hoarseness, dry cough, runny nose x3 to 4 days.  She said some diarrhea.  She denies fevers, chills, sweats, chest pain.  She has some shortness of breath with extended speaking however for the most part she is not short of breath.  She is taking Mucinex which helps.  Patient was positive for COVID-14 November 2019 and has gotten her antibodies checked and they were previously high.  For this reason she has not gotten her COVID vaccination.   The patient does have symptoms concerning for COVID-19 infection (fever, chills, cough, or new shortness of breath).    Past Medical History:  Diagnosis Date  . Allergy   . Anemia   . Anxiety   . Aortic atherosclerosis ([place])   . Arthritis   . Cataract   . Depression   . Family history of colon cancer   . Fibromyalgia   . GERD (gastroesophageal reflux disease)   .  Hyperlipidemia   . IBS (irritable bowel syndrome)   . Rectal prolapse   . Stomach ulcer   . Tick bite    took antibiotics memorial day weekend 2021 on back    Past Surgical History:  Procedure Laterality Date  . APPENDECTOMY    . CESAREAN SECTION    . CHOLECYSTECTOMY    . COLONOSCOPY  04/18/2014   Diverticulosis.   Marland Kitchen ESOPHAGOGASTRODUODENOSCOPY  04/14/2016   Schatzkis ring. Hiatal hernia. Gastritis.   Marland Kitchen LIPOMA EXCISION    . thermal ablation    . TONSILLECTOMY      Family History  Problem Relation Age of Onset  . Colon cancer Mother   . Liver cancer Father   . CAD Father   . Hypertension Sister   . Colon cancer Sister   . Hyperlipidemia Brother   . Gout Brother   . Diabetes Maternal Grandmother   . Diabetes Paternal Grandmother   . Heart attack Other   . Cancer Other        Leiomyosarcome and Liver  . Migraines Other   . Cancer Maternal Aunt        "female cancer, not breast"  . Cancer Paternal Aunt        "female cancer, not breast"  . Colon polyps Neg Hx   . Esophageal cancer Neg Hx   . Pancreatic cancer Neg Hx   . Rectal cancer Neg Hx   . Stomach cancer Neg Hx  Social History   Socioeconomic History  . Marital status: Widowed    Spouse name: Not on file  . Number of children: Not on file  . Years of education: Not on file  . Highest education level: Not on file  Occupational History  . Not on file  Tobacco Use  . Smoking status: Never Smoker  . Smokeless tobacco: Never Used  Vaping Use  . Vaping Use: Never used  Substance and Sexual Activity  . Alcohol use: Never  . Drug use: Never  . Sexual activity: Not on file  Other Topics Concern  . Not on file  Social History Narrative  . Not on file   Social Determinants of Health   Financial Resource Strain: Not on file  Food Insecurity: Not on file  Transportation Needs: Not on file  Physical Activity: Not on file  Stress: Not on file  Social Connections: Not on file  Intimate Partner  Violence: Not on file    Outpatient Medications Prior to Visit  Medication Sig Dispense Refill  . clidinium-chlordiazePOXIDE (LIBRAX) 5-2.5 MG capsule Take 1 capsule by mouth 3 (three) times daily before meals. 42 capsule 0  . dicyclomine (BENTYL) 10 MG capsule Take 1 capsule (10 mg total) by mouth 3 (three) times daily before meals. As needed 90 capsule 8  . gabapentin (NEURONTIN) 100 MG capsule Take 2 capsules (200 mg total) by mouth at bedtime. 60 capsule 3  . omeprazole (PRILOSEC) 40 MG capsule Take 1 capsule (40 mg total) by mouth in the morning and at bedtime. 60 capsule 3  . predniSONE (DELTASONE) 20 MG tablet Take 1 tablet (20 mg total) by mouth daily with breakfast. 7 tablet 0  . rosuvastatin (CRESTOR) 10 MG tablet TAKE 1 TABLET BY MOUTH EVERY DAY 90 tablet 0   No facility-administered medications prior to visit.    Allergies:   Amoxicillin-pot clavulanate and Iohexol   Social History   Tobacco Use  . Smoking status: Never Smoker  . Smokeless tobacco: Never Used  Vaping Use  . Vaping Use: Never used  Substance Use Topics  . Alcohol use: Never  . Drug use: Never     Review of Systems  Constitutional: Negative for chills, fever and malaise/fatigue.  HENT: Positive for sore throat. Negative for ear pain and sinus pain.   Respiratory: Positive for cough and shortness of breath.   Cardiovascular: Negative for chest pain.  Musculoskeletal: Negative for myalgias.  Neurological: Negative for headaches.     Labs/Other Tests and Data Reviewed:    Recent Labs: 05/14/2020: ALT 19; BUN 13; Creatinine, Ser 0.85; Hemoglobin 14.0; Platelets 216; Potassium 4.1; Sodium 139; TSH 1.800   Recent Lipid Panel Lab Results  Component Value Date/Time   CHOL 253 (H) 05/14/2020 09:34 AM   TRIG 195 (H) 05/14/2020 09:34 AM   HDL 50 05/14/2020 09:34 AM   CHOLHDL 5.1 (H) 05/14/2020 09:34 AM   LDLCALC 167 (H) 05/14/2020 09:34 AM    Wt Readings from Last 3 Encounters:  09/23/20 236 lb  (107 kg)  08/23/20 235 lb (106.6 kg)  05/14/20 235 lb (106.6 kg)     Objective:    Vital Signs:  Temp (!) 97 F (36.1 C)   SpO2 94%    Physical Exam  Pt is no sob on phone.  ASSESSMENT & PLAN:   1. Upper respiratory tract infection, unspecified type - POC COVID-19 BinaxNow; Future Continue Mucinex. Patient will come tomorrow for the rapid COVID test.  As her symptoms  have been going on 3 to 4 days we will not proceed with the PCR.  Orders Placed This Encounter  Procedures  . POC COVID-19 BinaxNow    I spent 10 minutes dedicated to the care of this patient on the date of this encounter. Follow Up:  In Person in 1 day(s)  Signed,  Rochel Brome, MD  10/11/2020 4:33 PM    [place]

## 2020-10-12 ENCOUNTER — Ambulatory Visit: Payer: PPO

## 2020-10-12 DIAGNOSIS — R059 Cough, unspecified: Secondary | ICD-10-CM

## 2020-10-12 DIAGNOSIS — Z20822 Contact with and (suspected) exposure to covid-19: Secondary | ICD-10-CM

## 2020-10-12 DIAGNOSIS — R197 Diarrhea, unspecified: Secondary | ICD-10-CM | POA: Diagnosis not present

## 2020-10-12 DIAGNOSIS — R0981 Nasal congestion: Secondary | ICD-10-CM | POA: Diagnosis not present

## 2020-10-12 LAB — POC COVID19 BINAXNOW: SARS Coronavirus 2 Ag: NEGATIVE

## 2020-10-12 NOTE — Addendum Note (Signed)
Addended by: Erie Noe on: 10/12/2020 11:47 AM   Modules accepted: Orders

## 2020-10-13 LAB — SARS-COV-2, NAA 2 DAY TAT

## 2020-10-13 LAB — NOVEL CORONAVIRUS, NAA: SARS-CoV-2, NAA: NOT DETECTED

## 2020-10-14 ENCOUNTER — Encounter: Payer: Self-pay | Admitting: Family Medicine

## 2020-10-14 ENCOUNTER — Other Ambulatory Visit: Payer: Self-pay | Admitting: Family Medicine

## 2020-10-14 MED ORDER — AZITHROMYCIN 500 MG PO TABS: 500.0000 mg | ORAL_TABLET | Freq: Every day | ORAL | 0 refills | Status: DC

## 2020-10-20 DIAGNOSIS — M6281 Muscle weakness (generalized): Secondary | ICD-10-CM | POA: Diagnosis not present

## 2020-10-20 DIAGNOSIS — R260 Ataxic gait: Secondary | ICD-10-CM | POA: Diagnosis not present

## 2020-10-26 DIAGNOSIS — M6281 Muscle weakness (generalized): Secondary | ICD-10-CM | POA: Diagnosis not present

## 2020-10-26 DIAGNOSIS — R260 Ataxic gait: Secondary | ICD-10-CM | POA: Diagnosis not present

## 2020-11-04 ENCOUNTER — Telehealth: Payer: Self-pay

## 2020-11-04 NOTE — Chronic Care Management (AMB) (Signed)
° ° °  Chronic Care Management Pharmacy Assistant   Name: Patricia Leonard  MRN: [id number] DOB: August 12, 1951  Reason for Encounter: Initial questions for appointment with [name], CPP  PCP : Rochel Brome, MD  Allergies:   Allergies  Allergen Reactions   Amoxicillin-Pot Clavulanate     Stomach pain    Iohexol      Desc: HAD FACIAL RASH WITH CT CONTRAST IN 2006     Medications: Outpatient Encounter Medications as of 11/04/2020  Medication Sig   azithromycin (ZITHROMAX) 500 MG tablet Take 1 tablet (500 mg total) by mouth daily.   clidinium-chlordiazePOXIDE (LIBRAX) 5-2.5 MG capsule Take 1 capsule by mouth 3 (three) times daily before meals.   dicyclomine (BENTYL) 10 MG capsule Take 1 capsule (10 mg total) by mouth 3 (three) times daily before meals. As needed   gabapentin (NEURONTIN) 100 MG capsule Take 2 capsules (200 mg total) by mouth at bedtime.   omeprazole (PRILOSEC) 40 MG capsule Take 1 capsule (40 mg total) by mouth in the morning and at bedtime.   predniSONE (DELTASONE) 20 MG tablet Take 1 tablet (20 mg total) by mouth daily with breakfast.   rosuvastatin (CRESTOR) 10 MG tablet TAKE 1 TABLET BY MOUTH EVERY DAY   No facility-administered encounter medications on file as of 11/04/2020.    Current Diagnosis: Patient Active Problem List   Diagnosis Date Noted   Greater trochanteric bursitis of right hip 09/23/2020   Cervical disc disorder with radiculopathy of cervical region 08/23/2020   Lumbar radiculopathy 08/23/2020   Rectocele 02/09/2020   Mixed hyperlipidemia 02/09/2020   Change in stool 02/09/2020   Aortic atherosclerosis ([place]) 02/09/2020   Irritable bowel syndrome with constipation 02/09/2020   Hiatal hernia 02/09/2020   Migraine 02/09/2020   GERD (gastroesophageal reflux disease) 01/20/2020   History of IBS 01/20/2020   Hx of adenomatous colonic polyps 01/20/2020   COVID-19 10/31/2019   Nuclear cataract 09/13/2011   Hyperopia with  astigmatism 09/13/2011   Have you seen any other providers since your last visit? PCP visit on 10/11/20  Any changes in your medications or health? Z-pack 10/14/20  Any side effects from any medications? Patient has no noted side effects.  Do you have an symptoms or problems not managed by your medications? Patient stated she is having some vision issues, she said her eyes feel blurry and sticky at times.  Any concerns about your health right now? She is having some back and groin pain, she is taking Gabapentin.  She is considering an MRI.  Has your provider asked that you check blood pressure, blood sugar, or follow special diet at home? Patient checks her blood pressure, she does not check her blood sugar, she is not following a special diet.   Do you get any type of exercise on a regular basis? Patient stated she walks her dog, she was at the beach earlier in the week and walked there.  Can you think of a goal you would like to reach for your health? She would like to have less pain and increase activity.  Do you have any problems getting your medications? No unusual problems noted.  Is there anything that you would like to discuss during the appointment? She wants to know if there is anything she should avoid being on Gabapentin.   Patient knows to have medications near by.  Follow-Up:  Pharmacist Review  [name], CPP notified  Clarita Leber, [place] Pharmacist Assistant [phone]

## 2020-11-07 NOTE — Progress Notes (Signed)
Chronic Care Management Pharmacy Note  11/10/2020 Name:  Patricia Leonard MRN:  [id number] DOB:  01-14-1951   Plan Recommendations:   Patient has not started Crestor due to concern with muscle pain worsening her current pain. Patient is going to wait until after upcoming lab work this week. Recommend considering starting three times weekly if LDL remains elevated. Patient working on staying active and trying to eat healthy.   Patient needs updated Dexa scan - prefers [facility] if possible.     Subjective: Patricia Leonard is an 70 y.o. year old female who is a primary patient of Cox, Kirsten, MD.  The CCM team was consulted for assistance with disease management and care coordination needs.    Engaged with patient by telephone for initial visit in response to provider referral for pharmacy case management and/or care coordination services.   Consent to Services:  The patient was given the following information about Chronic Care Management services today, agreed to services, and gave verbal consent: 1. CCM service includes personalized support from designated clinical staff supervised by the primary care provider, including individualized plan of care and coordination with other care providers 2. 24/7 contact phone numbers for assistance for urgent and routine care needs. 3. Service will only be billed when office clinical staff spend 20 minutes or more in a month to coordinate care. 4. Only one practitioner may furnish and bill the service in a calendar month. 5.The patient may stop CCM services at any time (effective at the end of the month) by phone call to the office staff. 6. The patient will be responsible for cost sharing (co-pay) of up to 20% of the service fee (after annual deductible is met). Patient agreed to services and consent obtained.  Patient Care Team: Rochel Brome, MD as PCP - General (Family Medicine) Jackquline Denmark, MD as Consulting Physician (Gastroenterology) Burnice Logan, [facility] as Pharmacist (Pharmacist)  Recent office visits: 10/12/2019 - continue mucinex. Ordered rapid COVID test. Negatvie test.  05/14/2020 - voltaren gel OTC for joint pain. No aleve may use tylenol. Kenalog shot. Call hand surgeon to call back. Get COVID vaccine.   Recent consult visits: 09/23/2020 - sport's medicine - physical therapy. Gabapentin continued.  08/23/2020 - Sport's medicine - xrays today. Ice 20 minutes bid. Exercises three times a week. Gabapentin 200 mg at night but can start 100 mg night. Tart Cherry Extract 1200 mg at night. Vitamin D 2000 IU daily.  06/07/2020 - general surgeon - constipation. Refer to pelvic floor PT   Hospital visits: None in previous 6 months  Objective:  Lab Results  Component Value Date   CREATININE 0.85 05/14/2020   BUN 13 05/14/2020   GFR 65.95 01/19/2020   GFRNONAA 69 05/14/2020   GFRAA 80 05/14/2020   NA 139 05/14/2020   K 4.1 05/14/2020   CALCIUM 9.2 05/14/2020   CO2 23 05/14/2020    Lab Results  Component Value Date/Time   GFR 65.95 01/19/2020 12:32 PM    Last diabetic Eye exam: No results found for: HMDIABEYEEXA  Last diabetic Foot exam: No results found for: HMDIABFOOTEX   Lab Results  Component Value Date   CHOL 253 (H) 05/14/2020   HDL 50 05/14/2020   LDLCALC 167 (H) 05/14/2020   TRIG 195 (H) 05/14/2020   CHOLHDL 5.1 (H) 05/14/2020    Hepatic Function Latest Ref Rng & Units 05/14/2020 01/19/2020  Total Protein 6.0 - 8.5 g/dL 6.5 7.1  Albumin 3.7 - 4.7 g/dL 4.1  4.2  AST 0 - 40 IU/L 21 19  ALT 0 - 32 IU/L 19 18  Alk Phosphatase 48 - 121 IU/L 88 77  Total Bilirubin 0.0 - 1.2 mg/dL 0.5 0.6    Lab Results  Component Value Date/Time   TSH 1.800 05/14/2020 09:34 AM    CBC Latest Ref Rng & Units 05/14/2020 01/19/2020  WBC 3.4 - 10.8 x10E3/uL 3.9 5.0  Hemoglobin 11.1 - 15.9 g/dL 14.0 14.2  Hematocrit 34.0 - 46.6 % 41.3 42.3  Platelets 150 - 450 x10E3/uL 216 199.0    No results found for:  VD25OH  Clinical ASCVD: Yes  The 10-year ASCVD risk score Mikey Bussing DC Jr., et al., 2013) is: 15.5%   Values used to calculate the score:     Age: 70 years     Sex: Female     Is Non-Hispanic African American: No     Diabetic: No     Tobacco smoker: No     Systolic Blood Pressure: 440 mmHg     Is BP treated: No     HDL Cholesterol: 50 mg/dL     Total Cholesterol: 253 mg/dL    No flowsheet data found.   Social History   Tobacco Use  Smoking Status Never Smoker  Smokeless Tobacco Never Used   BP Readings from Last 3 Encounters:  09/23/20 (!) 152/90  08/23/20 124/90  05/14/20 124/68   Pulse Readings from Last 3 Encounters:  09/23/20 65  08/23/20 68  05/14/20 72   Wt Readings from Last 3 Encounters:  09/23/20 236 lb (107 kg)  08/23/20 235 lb (106.6 kg)  05/14/20 235 lb (106.6 kg)    Assessment/Interventions: Review of patient past medical history, allergies, medications, health status, including review of consultants reports, laboratory and other test data, was performed as part of comprehensive evaluation and provision of chronic care management services.   SDOH:  (Social Determinants of Health) assessments and interventions performed: Yes   CCM Care Plan  Allergies  Allergen Reactions  . Amoxicillin-Pot Clavulanate     Stomach pain   . Iohexol      Desc: HAD FACIAL RASH WITH CT CONTRAST IN 2006     Medications Reviewed Today    Reviewed by Burnice Logan, [facility] (Pharmacist) on 11/08/20 at 1454  Med List Status: <None>  Medication Order Taking? Sig Documenting Provider Last Dose Status Informant  clidinium-chlordiazePOXIDE (LIBRAX) 5-2.5 MG capsule [id number] No Take 1 capsule by mouth 3 (three) times daily before meals.  Patient not taking: Reported on 11/08/2020   Jackquline Denmark, MD Not Taking Active   dicyclomine (BENTYL) 10 MG capsule [id number] Yes Take 1 capsule (10 mg total) by mouth 3 (three) times daily before meals. As needed Esterwood, Amy S, PA-C Taking  Active   gabapentin (NEURONTIN) 100 MG capsule [id number] Yes Take 2 capsules (200 mg total) by mouth at bedtime. Lyndal Pulley, DO Taking Active   magnesium hydroxide (MILK OF MAGNESIA) 400 MG/5ML suspension [id number] Yes Take 15 mLs by mouth daily as needed for mild constipation. [provider] Taking Active   omeprazole (PRILOSEC) 40 MG capsule [id number] Yes Take 1 capsule (40 mg total) by mouth in the morning and at bedtime. Jackquline Denmark, MD Taking Active   rosuvastatin (CRESTOR) 10 MG tablet [id number] No TAKE 1 TABLET BY MOUTH EVERY DAY  Patient not taking: Reported on 11/08/2020   Rochel Brome, MD Not Taking Active           Patient Active Problem  List   Diagnosis Date Noted  . Greater trochanteric bursitis of right hip 09/23/2020  . Cervical disc disorder with radiculopathy of cervical region 08/23/2020  . Lumbar radiculopathy 08/23/2020  . Rectocele 02/09/2020  . Mixed hyperlipidemia 02/09/2020  . Change in stool 02/09/2020  . Aortic atherosclerosis ([place]) 02/09/2020  . Irritable bowel syndrome with constipation 02/09/2020  . Hiatal hernia 02/09/2020  . Migraine 02/09/2020  . GERD (gastroesophageal reflux disease) 01/20/2020  . History of IBS 01/20/2020  . Hx of adenomatous colonic polyps 01/20/2020  . COVID-19 10/31/2019  . Nuclear cataract 09/13/2011  . Hyperopia with astigmatism 09/13/2011     There is no immunization history on file for this patient.  Conditions to be addressed/monitored:  Hyperlipidemia, GERD, GERD, IBS-C.   Care Plan : [place]  Updates made by Burnice Logan, RPH since 11/10/2020 12:00 AM    Problem: IBS, GERD, Hyperlipidemia   Priority: High  Onset Date: 11/08/2020    Long-Range Goal: disease management   Start Date: 11/08/2020  Expected End Date: 11/08/2021  This Visit's Progress: On track  Priority: High  Note:    Current Barriers:  . Unable to achieve control of cholesterol    Pharmacist Clinical Goal(s):   Marland Kitchen Over the next 90 days, patient will achieve control of cholesterol  as evidenced by lipid panel . adhere to plan to optimize therapeutic regimen for cholesterol as evidenced by report of adherence to recommended medication management changes through collaboration with PharmD and provider.      Interventions: . 1:1 collaboration with Rochel Brome, MD regarding development and update of comprehensive plan of care as evidenced by provider attestation and co-signature . Inter-disciplinary care team collaboration (see longitudinal plan of care) . Comprehensive medication review performed; medication list updated in electronic medical record  Hyperlipidemia: (LDL goal < 100) -uncontrolled -Current treatment: . Crestor 10 mg daily - got it filled but scared to take due to muscle pain -Medications previously tried: red yeast rice  -Current dietary patterns: likes vegetables -Current exercise habits: walking her dog and walking at the beach.  -Educated on Cholesterol goals;  Benefits of statin for ASCVD risk reduction; Importance of limiting foods high in cholesterol; Exercise goal of 150 minutes per week; -Counseled on diet and exercise extensively  -Coordinated patient labs drawn prior to upcoming appointment.  -Patient has not taken Crestor due to potential of worsening pain.   Osteoporosis / Osteopenia (Goal get updated Dexa Scan to assess bones) -uncertain control -Last DEXA Scan: needs to be updated per patient  -Current treatment  . Calcium citrate D bid - recommended patient begin due to PPI -Medications previously tried: calcium with D  -Recommend [phone] units of vitamin D daily. Recommend 1200 mg of calcium daily from dietary and supplemental sources. Recommend weight-bearing and muscle strengthening exercises for building and maintaining bone density. -Counseled on diet and exercise extensively Recommended Calicum citrate with D twice daily and updated Dexa scan.   -Needs  updated Dexa scan - would like referral to [facility] if possible  IBS-C (Goal: manage constipation and stomach cramping) -controlled -Current treatment  . dicyclomine 10 mg tid . Librax tid before meals -Medications previously tried: none reported  -Counseled on diet and exercise extensively Recommended to continue current medication   GERD (Goal: symptom control/management) -uncontrolled -Current treatment  . omeprazole 40 mg twice daily  -Medications previously tried: none reported  -Recommended to continue current medication Counseled on small meals, elevating head of bed and avoiding tight  waistbands - Large hiatal hernia - has to use caution eating too much.   Pain  (Goal: manage pain) -controlled -Current treatment  . gabapentin 100 mg - 2 capsules at bedtime -Medications previously tried: none reported  -Counseled on diet and exercise extensively Recommended to continue current medication   Health Maintenance -Vaccine gaps: none on file - will discuss at next visit.   -Patient is satisfied with current therapy and denies issues -Counseled on diet and exercise extensively Recommended to continue current medication   Patient Goals/Self-Care Activities . Over the next 90 days, patient will:  - take medications as prescribed  Follow Up Plan: Telephone follow up appointment with care management team member scheduled for: 12/2020      Medication Assistance: None required.  Patient affirms current coverage meets needs.  Patient's preferred pharmacy is:  [id number] [address] [address]Phone: [phone] Fax: [phone] Uses pill box? Yes Pt endorses 100% compliance  We discussed: Current pharmacy is preferred with insurance plan and patient is satisfied with pharmacy services Patient decided to: Continue current medication management strategy  Care Plan and Follow Up Patient  Decision:  Patient agrees to Care Plan and Follow-up.  Plan: Telephone follow up appointment with care management team member scheduled for:  02/2021

## 2020-11-08 ENCOUNTER — Other Ambulatory Visit: Payer: Self-pay

## 2020-11-08 ENCOUNTER — Ambulatory Visit (INDEPENDENT_AMBULATORY_CARE_PROVIDER_SITE_OTHER): Payer: PPO

## 2020-11-08 DIAGNOSIS — I700 Atherosclerosis of aorta: Secondary | ICD-10-CM

## 2020-11-08 DIAGNOSIS — E782 Mixed hyperlipidemia: Secondary | ICD-10-CM

## 2020-11-08 DIAGNOSIS — K219 Gastro-esophageal reflux disease without esophagitis: Secondary | ICD-10-CM

## 2020-11-08 DIAGNOSIS — K581 Irritable bowel syndrome with constipation: Secondary | ICD-10-CM

## 2020-11-10 ENCOUNTER — Ambulatory Visit: Payer: PPO | Admitting: Family Medicine

## 2020-11-10 ENCOUNTER — Telehealth: Payer: Self-pay

## 2020-11-10 ENCOUNTER — Other Ambulatory Visit: Payer: Self-pay | Admitting: Family Medicine

## 2020-11-10 DIAGNOSIS — Z13820 Encounter for screening for osteoporosis: Secondary | ICD-10-CM

## 2020-11-10 DIAGNOSIS — Z780 Asymptomatic menopausal state: Secondary | ICD-10-CM

## 2020-11-10 DIAGNOSIS — M810 Age-related osteoporosis without current pathological fracture: Secondary | ICD-10-CM

## 2020-11-10 NOTE — Patient Instructions (Addendum)
Visit Information  Goals Addressed            This Visit's Progress   . Learn More About My Health       Timeframe:  Long-Range Goal Priority:  High Start Date:               11/08/2020              Expected End Date:                 11/08/2021       Follow Up Date 12/28/2020    - make a list of questions - ask questions - bring a list of my medicines to the visit - speak up when I don't understand    Why is this important?    The best way to learn about your health and care is by talking to the doctor and nurse.   They will answer your questions and give you information in the way that you like best.    Notes:     Marland Kitchen Manage My Diet-Irritable Bowel Syndrome       Timeframe:  Long-Range Goal Priority:  High Start Date:        11/08/2020                     Expected End Date:          11/08/2021             Follow Up Date 12/28/2020    - avoid foods that make my symptoms worse - eat 5 or 6 small meals each day    Why is this important?    Changing what you eat and drink may help you to manage your condition.   It is important to take it slow, making 1 change at a time.   A dietitian is the best person to guide you.    Notes:     Marland Kitchen Manage My Medicine       Timeframe:  Long-Range Goal Priority:  High Start Date:       11/08/2020                      Expected End Date:         11/08/2021              Follow Up Date 12/28/2020    - call for medicine refill 2 or 3 days before it runs out - keep a list of all the medicines I take; vitamins and herbals too - use a pillbox to sort medicine    Why is this important?   . These steps will help you keep on track with your medicines.   Notes:        Print copy of patient instructions, educational materials, and care plan provided in person.  Telephone follow up appointment with pharmacy team member scheduled for: 12/2020  Burnice Logan, [facility]  Heart-Healthy Eating Plan Heart-healthy meal planning  includes:  Eating less unhealthy fats.  Eating more healthy fats.  Making other changes in your diet. Talk with your doctor or a diet specialist (dietitian) to create an eating plan that is right for you. What is my plan? Your doctor may recommend an eating plan that includes:  Total fat: ______% or less of total calories a day.  Saturated fat: ______% or less of total calories a day.  Cholesterol: less than _________mg a day. What  are tips for following this plan? Cooking Avoid frying your food. Try to bake, boil, grill, or broil it instead. You can also reduce fat by:  Removing the skin from poultry.  Removing all visible fats from meats.  Steaming vegetables in water or broth. Meal planning  At meals, divide your plate into four equal parts: ? Fill one-half of your plate with vegetables and green salads. ? Fill one-fourth of your plate with whole grains. ? Fill one-fourth of your plate with lean protein foods.  Eat 4-5 servings of vegetables per day. A serving of vegetables is: ? 1 cup of raw or cooked vegetables. ? 2 cups of raw leafy greens.  Eat 4-5 servings of fruit per day. A serving of fruit is: ? 1 medium whole fruit. ?  cup of dried fruit. ?  cup of fresh, frozen, or canned fruit. ?  cup of 100% fruit juice.  Eat more foods that have soluble fiber. These are apples, broccoli, carrots, beans, peas, and barley. Try to get 20-30 g of fiber per day.  Eat 4-5 servings of nuts, legumes, and seeds per week: ? 1 serving of dried beans or legumes equals  cup after being cooked. ? 1 serving of nuts is  cup. ? 1 serving of seeds equals 1 tablespoon.   General information  Eat more home-cooked food. Eat less restaurant, buffet, and fast food.  Limit or avoid alcohol.  Limit foods that are high in starch and sugar.  Avoid fried foods.  Lose weight if you are overweight.  Keep track of how much salt (sodium) you eat. This is important if you have high  blood pressure. Ask your doctor to tell you more about this.  Try to add vegetarian meals each week. Fats  Choose healthy fats. These include olive oil and canola oil, flaxseeds, walnuts, almonds, and seeds.  Eat more omega-3 fats. These include salmon, mackerel, sardines, tuna, flaxseed oil, and ground flaxseeds. Try to eat fish at least 2 times each week.  Check food labels. Avoid foods with trans fats or high amounts of saturated fat.  Limit saturated fats. ? These are often found in animal products, such as meats, butter, and cream. ? These are also found in plant foods, such as palm oil, palm kernel oil, and coconut oil.  Avoid foods with partially hydrogenated oils in them. These have trans fats. Examples are stick margarine, some tub margarines, cookies, crackers, and other baked goods. What foods can I eat? Fruits All fresh, canned (in natural juice), or frozen fruits. Vegetables Fresh or frozen vegetables (raw, steamed, roasted, or grilled). Green salads. Grains Most grains. Choose whole wheat and whole grains most of the time. Rice and pasta, including [place] rice and pastas made with whole wheat. Meats and other proteins Lean, well-trimmed beef, veal, pork, and lamb. Chicken and [place] without skin. All fish and shellfish. Wild duck, rabbit, pheasant, and venison. Egg whites or low-cholesterol egg substitutes. Dried beans, peas, lentils, and tofu. Seeds and most nuts. Dairy Low-fat or nonfat cheeses, including ricotta and mozzarella. Skim or 1% milk that is liquid, powdered, or evaporated. Buttermilk that is made with low-fat milk. Nonfat or low-fat yogurt. Fats and oils Non-hydrogenated (trans-free) margarines. Vegetable oils, including soybean, sesame, sunflower, olive, peanut, safflower, corn, canola, and cottonseed. Salad dressings or mayonnaise made with a vegetable oil. Beverages Mineral water. Coffee and tea. Diet carbonated beverages. Sweets and desserts Sherbet,  gelatin, and fruit ice. Small amounts of dark chocolate. Limit all sweets  and desserts. Seasonings and condiments All seasonings and condiments. The items listed above may not be a complete list of foods and drinks you can eat. Contact a dietitian for more options. What foods should I avoid? Fruits Canned fruit in heavy syrup. Fruit in cream or butter sauce. Fried fruit. Limit coconut. Vegetables Vegetables cooked in cheese, cream, or butter sauce. Fried vegetables. Grains Breads that are made with saturated or trans fats, oils, or whole milk. Croissants. Sweet rolls. Donuts. High-fat crackers, such as cheese crackers. Meats and other proteins Fatty meats, such as hot dogs, ribs, sausage, bacon, rib-eye roast or steak. High-fat deli meats, such as salami and bologna. Caviar. Domestic duck and goose. Organ meats, such as liver. Dairy Cream, sour cream, cream cheese, and creamed cottage cheese. Whole-milk cheeses. Whole or 2% milk that is liquid, evaporated, or condensed. Whole buttermilk. Cream sauce or high-fat cheese sauce. Yogurt that is made from whole milk. Fats and oils Meat fat, or shortening. Cocoa butter, hydrogenated oils, palm oil, coconut oil, palm kernel oil. Solid fats and shortenings, including bacon fat, salt pork, lard, and butter. Nondairy cream substitutes. Salad dressings with cheese or sour cream. Beverages Regular sodas and juice drinks with added sugar. Sweets and desserts Frosting. Pudding. Cookies. Cakes. Pies. Milk chocolate or white chocolate. Buttered syrups. Full-fat ice cream or ice cream drinks. The items listed above may not be a complete list of foods and drinks to avoid. Contact a dietitian for more information. Summary  Heart-healthy meal planning includes eating less unhealthy fats, eating more healthy fats, and making other changes in your diet.  Eat a balanced diet. This includes fruits and vegetables, low-fat or nonfat dairy, lean protein, nuts and  legumes, whole grains, and heart-healthy oils and fats. This information is not intended to replace advice given to you by your health care provider. Make sure you discuss any questions you have with your health care provider. Document Revised: 11/15/2017 Document Reviewed: 10/19/2017 Elsevier Patient Education  2021 [place] Heart-healthy meal planning includes:  Eating less unhealthy fats.  Eating more healthy fats.  Making other changes in your diet. Talk with your doctor or a diet specialist (dietitian) to create an eating plan that is right for you. What is my plan? Your doctor may recommend an eating plan that includes:  Total fat: ______% or less of total calories a day.  Saturated fat: ______% or less of total calories a day.  Cholesterol: less than _________mg a day. What are tips for following this plan? Cooking Avoid frying your food. Try to bake, boil, grill, or broil it instead. You can also reduce fat by:  Removing the skin from poultry.  Removing all visible fats from meats.  Steaming vegetables in water or broth. Meal planning  At meals, divide your plate into four equal parts: ? Fill one-half of your plate with vegetables and green salads. ? Fill one-fourth of your plate with whole grains. ? Fill one-fourth of your plate with lean protein foods.  Eat 4-5 servings of vegetables per day. A serving of vegetables is: ? 1 cup of raw or cooked vegetables. ? 2 cups of raw leafy greens.  Eat 4-5 servings of fruit per day. A serving of fruit is: ? 1 medium whole fruit. ?  cup of dried fruit. ?  cup of fresh, frozen, or canned fruit. ?  cup of 100% fruit juice.  Eat more foods that have soluble fiber. These are apples, broccoli, carrots, beans,  peas, and barley. Try to get 20-30 g of fiber per day.  Eat 4-5 servings of nuts, legumes, and seeds per week: ? 1 serving of dried beans or legumes equals  cup after being  cooked. ? 1 serving of nuts is  cup. ? 1 serving of seeds equals 1 tablespoon.   General information  Eat more home-cooked food. Eat less restaurant, buffet, and fast food.  Limit or avoid alcohol.  Limit foods that are high in starch and sugar.  Avoid fried foods.  Lose weight if you are overweight.  Keep track of how much salt (sodium) you eat. This is important if you have high blood pressure. Ask your doctor to tell you more about this.  Try to add vegetarian meals each week. Fats  Choose healthy fats. These include olive oil and canola oil, flaxseeds, walnuts, almonds, and seeds.  Eat more omega-3 fats. These include salmon, mackerel, sardines, tuna, flaxseed oil, and ground flaxseeds. Try to eat fish at least 2 times each week.  Check food labels. Avoid foods with trans fats or high amounts of saturated fat.  Limit saturated fats. ? These are often found in animal products, such as meats, butter, and cream. ? These are also found in plant foods, such as palm oil, palm kernel oil, and coconut oil.  Avoid foods with partially hydrogenated oils in them. These have trans fats. Examples are stick margarine, some tub margarines, cookies, crackers, and other baked goods. What foods can I eat? Fruits All fresh, canned (in natural juice), or frozen fruits. Vegetables Fresh or frozen vegetables (raw, steamed, roasted, or grilled). Green salads. Grains Most grains. Choose whole wheat and whole grains most of the time. Rice and pasta, including [place] rice and pastas made with whole wheat. Meats and other proteins Lean, well-trimmed beef, veal, pork, and lamb. Chicken and [place] without skin. All fish and shellfish. Wild duck, rabbit, pheasant, and venison. Egg whites or low-cholesterol egg substitutes. Dried beans, peas, lentils, and tofu. Seeds and most nuts. Dairy Low-fat or nonfat cheeses, including ricotta and mozzarella. Skim or 1% milk that is liquid, powdered, or  evaporated. Buttermilk that is made with low-fat milk. Nonfat or low-fat yogurt. Fats and oils Non-hydrogenated (trans-free) margarines. Vegetable oils, including soybean, sesame, sunflower, olive, peanut, safflower, corn, canola, and cottonseed. Salad dressings or mayonnaise made with a vegetable oil. Beverages Mineral water. Coffee and tea. Diet carbonated beverages. Sweets and desserts Sherbet, gelatin, and fruit ice. Small amounts of dark chocolate. Limit all sweets and desserts. Seasonings and condiments All seasonings and condiments. The items listed above may not be a complete list of foods and drinks you can eat. Contact a dietitian for more options. What foods should I avoid? Fruits Canned fruit in heavy syrup. Fruit in cream or butter sauce. Fried fruit. Limit coconut. Vegetables Vegetables cooked in cheese, cream, or butter sauce. Fried vegetables. Grains Breads that are made with saturated or trans fats, oils, or whole milk. Croissants. Sweet rolls. Donuts. High-fat crackers, such as cheese crackers. Meats and other proteins Fatty meats, such as hot dogs, ribs, sausage, bacon, rib-eye roast or steak. High-fat deli meats, such as salami and bologna. Caviar. Domestic duck and goose. Organ meats, such as liver. Dairy Cream, sour cream, cream cheese, and creamed cottage cheese. Whole-milk cheeses. Whole or 2% milk that is liquid, evaporated, or condensed. Whole buttermilk. Cream sauce or high-fat cheese sauce. Yogurt that is made from whole milk. Fats and oils Meat fat, or shortening. Cocoa butter, hydrogenated  oils, palm oil, coconut oil, palm kernel oil. Solid fats and shortenings, including bacon fat, salt pork, lard, and butter. Nondairy cream substitutes. Salad dressings with cheese or sour cream. Beverages Regular sodas and juice drinks with added sugar. Sweets and desserts Frosting. Pudding. Cookies. Cakes. Pies. Milk chocolate or white chocolate. Buttered syrups. Full-fat  ice cream or ice cream drinks. The items listed above may not be a complete list of foods and drinks to avoid. Contact a dietitian for more information. Summary  Heart-healthy meal planning includes eating less unhealthy fats, eating more healthy fats, and making other changes in your diet.  Eat a balanced diet. This includes fruits and vegetables, low-fat or nonfat dairy, lean protein, nuts and legumes, whole grains, and heart-healthy oils and fats. This information is not intended to replace advice given to you by your health care provider. Make sure you discuss any questions you have with your health care provider. Document Revised: 11/15/2017 Document Reviewed: 10/19/2017 Elsevier Patient Education  2021 [employer].

## 2020-11-10 NOTE — Telephone Encounter (Signed)
Bone Density order.

## 2020-11-11 ENCOUNTER — Other Ambulatory Visit: Payer: Self-pay

## 2020-11-11 ENCOUNTER — Ambulatory Visit: Payer: PPO

## 2020-11-11 DIAGNOSIS — E782 Mixed hyperlipidemia: Secondary | ICD-10-CM | POA: Diagnosis not present

## 2020-11-12 LAB — COMPREHENSIVE METABOLIC PANEL
ALT: 18 IU/L (ref 0–32)
AST: 19 IU/L (ref 0–40)
Albumin/Globulin Ratio: 2 (ref 1.2–2.2)
Albumin: 4 g/dL (ref 3.7–4.7)
Alkaline Phosphatase: 79 IU/L (ref 44–121)
BUN/Creatinine Ratio: 13 (ref 12–28)
BUN: 11 mg/dL (ref 8–27)
Bilirubin Total: 0.6 mg/dL (ref 0.0–1.2)
CO2: 23 mmol/L (ref 20–29)
Calcium: 9 mg/dL (ref 8.7–10.3)
Chloride: 105 mmol/L (ref 96–106)
Creatinine, Ser: 0.86 mg/dL (ref 0.57–1.00)
GFR calc Af Amer: 79 mL/min/{1.73_m2} (ref >59)
GFR calc non Af Amer: 68 mL/min/{1.73_m2} (ref >59)
Globulin, Total: 2 g/dL (ref 1.5–4.5)
Glucose: 96 mg/dL (ref 65–99)
Potassium: 4.3 mmol/L (ref 3.5–5.2)
Sodium: 142 mmol/L (ref 134–144)
Total Protein: 6 g/dL (ref 6.0–8.5)

## 2020-11-12 LAB — CBC WITH DIFFERENTIAL/PLATELET
Basophils Absolute: 0 10*3/uL (ref 0.0–0.2)
Basos: 1 %
EOS (ABSOLUTE): 0.1 10*3/uL (ref 0.0–0.4)
Eos: 3 %
Hematocrit: 43.2 % (ref 34.0–46.6)
Hemoglobin: 14.1 g/dL (ref 11.1–15.9)
Immature Grans (Abs): 0 10*3/uL (ref 0.0–0.1)
Immature Granulocytes: 0 %
Lymphocytes Absolute: 1.3 10*3/uL (ref 0.7–3.1)
Lymphs: 39 %
MCH: 29.1 pg (ref 26.6–33.0)
MCHC: 32.6 g/dL (ref 31.5–35.7)
MCV: 89 fL (ref 79–97)
Monocytes Absolute: 0.3 10*3/uL (ref 0.1–0.9)
Monocytes: 9 %
Neutrophils Absolute: 1.7 10*3/uL (ref 1.4–7.0)
Neutrophils: 48 %
Platelets: 186 10*3/uL (ref 150–450)
RBC: 4.84 x10E6/uL (ref 3.77–5.28)
RDW: 13.1 % (ref 11.7–15.4)
WBC: 3.4 10*3/uL (ref 3.4–10.8)

## 2020-11-12 LAB — LIPID PANEL
Chol/HDL Ratio: 4 ratio (ref 0.0–4.4)
Cholesterol, Total: 209 mg/dL — ABNORMAL HIGH (ref 100–199)
HDL: 52 mg/dL (ref >39)
LDL Chol Calc (NIH): 128 mg/dL — ABNORMAL HIGH (ref 0–99)
Triglycerides: 162 mg/dL — ABNORMAL HIGH (ref 0–149)
VLDL Cholesterol Cal: 29 mg/dL (ref 5–40)

## 2020-11-12 LAB — CARDIOVASCULAR RISK ASSESSMENT

## 2020-11-16 ENCOUNTER — Other Ambulatory Visit: Payer: Self-pay

## 2020-11-16 ENCOUNTER — Ambulatory Visit (INDEPENDENT_AMBULATORY_CARE_PROVIDER_SITE_OTHER): Payer: PPO | Admitting: Family Medicine

## 2020-11-16 VITALS — BP 148/70 | HR 60 | Temp 97.0°F | Resp 16 | Ht 66.0 in | Wt 212.0 lb

## 2020-11-16 DIAGNOSIS — J3089 Other allergic rhinitis: Secondary | ICD-10-CM

## 2020-11-16 DIAGNOSIS — R300 Dysuria: Secondary | ICD-10-CM | POA: Diagnosis not present

## 2020-11-16 DIAGNOSIS — G43819 Other migraine, intractable, without status migrainosus: Secondary | ICD-10-CM | POA: Diagnosis not present

## 2020-11-16 DIAGNOSIS — M25551 Pain in right hip: Secondary | ICD-10-CM

## 2020-11-16 DIAGNOSIS — M810 Age-related osteoporosis without current pathological fracture: Secondary | ICD-10-CM | POA: Diagnosis not present

## 2020-11-16 DIAGNOSIS — I700 Atherosclerosis of aorta: Secondary | ICD-10-CM | POA: Diagnosis not present

## 2020-11-16 DIAGNOSIS — K581 Irritable bowel syndrome with constipation: Secondary | ICD-10-CM

## 2020-11-16 DIAGNOSIS — E782 Mixed hyperlipidemia: Secondary | ICD-10-CM | POA: Diagnosis not present

## 2020-11-16 DIAGNOSIS — K219 Gastro-esophageal reflux disease without esophagitis: Secondary | ICD-10-CM

## 2020-11-16 DIAGNOSIS — R002 Palpitations: Secondary | ICD-10-CM | POA: Diagnosis not present

## 2020-11-16 LAB — POCT URINALYSIS DIPSTICK
Blood, UA: NEGATIVE
Glucose, UA: NEGATIVE
Ketones, UA: NEGATIVE
Leukocytes, UA: NEGATIVE
Nitrite, UA: NEGATIVE
Protein, UA: POSITIVE — AB
Spec Grav, UA: 1.02 (ref 1.010–1.025)
Urobilinogen, UA: 0.2 E.U./dL
pH, UA: 7 (ref 5.0–8.0)

## 2020-11-16 NOTE — Progress Notes (Signed)
Subjective:  Patient ID: Patricia Leonard, female    DOB: 12-27-1950  Age: 70 y.o. MRN: [id number]  Chief Complaint  Patient presents with  . Hyperlipidemia  . Hypertension    HPI  Mixed hyperlipidemia - Patient is eating healthy. Cholesterol improved with diet. Not exercising. Patient is not taking crestor. She is afraid it will worsen her joint and muscle pain.   Osteoporosis, postmenopausal - Patient is taking calcium with vitamin D.  Gastroesophageal reflux disease without esophagitis - Patient is controlled with omeprazole 40 mg daily and milk of magnesia.  Irritable bowel syndrome with constipation - She takes dicyclomine 10 mg three times daily before meals as needed.  Other migraine without status migrainosus, intractable  Atherosclerosis of Aorta- Not taking crestor because she is afraid it would cause worsening of her muscle and joint pain.   Complaining of right groin pain and right hip pain. Checked xrays in Dr. Thompson Caul office did not show OA of hip. Has completed physical therapy, but she feels it is making it worse. Started on gabapentin 100 mg once a day at night. She feels odd if she misses a dose. On the day without gabapentin she feels strange. Difficulty describing "feeling." She does not want to keep taking it. Pt needs to schedule a follow up visit with Dr. Tamala Julian.   Current Outpatient Medications on File Prior to Visit  Medication Sig Dispense Refill  . calcium citrate-vitamin D (CITRACAL+D) 315-200 MG-UNIT tablet Take 1 tablet by mouth 2 (two) times daily.    Marland Kitchen dicyclomine (BENTYL) 10 MG capsule Take 1 capsule (10 mg total) by mouth 3 (three) times daily before meals. As needed 90 capsule 8  . gabapentin (NEURONTIN) 100 MG capsule Take 2 capsules (200 mg total) by mouth at bedtime. 60 capsule 3  . magnesium hydroxide (MILK OF MAGNESIA) 400 MG/5ML suspension Take 15 mLs by mouth daily as needed for mild constipation.    Marland Kitchen omeprazole (PRILOSEC) 40 MG capsule  Take 1 capsule (40 mg total) by mouth in the morning and at bedtime. 60 capsule 3  . rosuvastatin (CRESTOR) 10 MG tablet TAKE 1 TABLET BY MOUTH EVERY DAY (Patient not taking: No sig reported) 90 tablet 0   No current facility-administered medications on file prior to visit.   Past Medical History:  Diagnosis Date  . Allergy   . Anemia   . Anxiety   . Aortic atherosclerosis ([place])   . Arthritis   . Cataract   . Depression   . Family history of colon cancer   . Fibromyalgia   . GERD (gastroesophageal reflux disease)   . Hyperlipidemia   . IBS (irritable bowel syndrome)   . Rectal prolapse   . Stomach ulcer   . Tick bite    took antibiotics memorial day weekend 2021 on back   Past Surgical History:  Procedure Laterality Date  . APPENDECTOMY    . CESAREAN SECTION    . CHOLECYSTECTOMY    . COLONOSCOPY  04/18/2014   Diverticulosis.   Marland Kitchen ESOPHAGOGASTRODUODENOSCOPY  04/14/2016   Schatzkis ring. Hiatal hernia. Gastritis.   Marland Kitchen LIPOMA EXCISION    . thermal ablation    . TONSILLECTOMY      Family History  Problem Relation Age of Onset  . Colon cancer Mother   . Liver cancer Father   . CAD Father   . Hypertension Sister   . Colon cancer Sister   . Hyperlipidemia Brother   . Gout Brother   . Diabetes  Maternal Grandmother   . Diabetes Paternal Grandmother   . Heart attack Other   . Cancer Other        Leiomyosarcome and Liver  . Migraines Other   . Cancer Maternal Aunt        "female cancer, not breast"  . Cancer Paternal Aunt        "female cancer, not breast"  . Colon polyps Neg Hx   . Esophageal cancer Neg Hx   . Pancreatic cancer Neg Hx   . Rectal cancer Neg Hx   . Stomach cancer Neg Hx    Social History   Socioeconomic History  . Marital status: Widowed    Spouse name: Not on file  . Number of children: Not on file  . Years of education: Not on file  . Highest education level: Not on file  Occupational History  . Not on file  Tobacco Use  . Smoking status:  Never Smoker  . Smokeless tobacco: Never Used  Vaping Use  . Vaping Use: Never used  Substance and Sexual Activity  . Alcohol use: Never  . Drug use: Never  . Sexual activity: Not on file  Other Topics Concern  . Not on file  Social History Narrative  . Not on file   Social Determinants of Health   Financial Resource Strain: Not on file  Food Insecurity: No Food Insecurity  . Worried About Charity fundraiser in the Last Year: Never true  . Ran Out of Food in the Last Year: Never true  Transportation Needs: No Transportation Needs  . Lack of Transportation (Medical): No  . Lack of Transportation (Non-Medical): No  Physical Activity: Not on file  Stress: Not on file  Social Connections: Not on file    Review of Systems  Constitutional: Negative for chills, fatigue and fever.  HENT: Positive for ear pain, postnasal drip, rhinorrhea and voice change (hoarseness). Negative for congestion and sore throat.   Respiratory: Positive for shortness of breath (with exertion). Negative for cough.   Cardiovascular: Positive for palpitations (heart races in the morning. ). Negative for chest pain.  Gastrointestinal: Positive for abdominal pain, constipation (Numerous bms, but has to strain. ) and nausea. Negative for diarrhea and vomiting.  Endocrine: Negative for polydipsia, polyphagia and polyuria.  Genitourinary: Negative for difficulty urinating and dysuria.       Poor bladder control (worse when sneezing).   Musculoskeletal: Positive for arthralgias, back pain and myalgias.  Skin: Positive for rash.  Neurological: Negative for headaches.  Psychiatric/Behavioral: Negative for dysphoric mood. The patient is not nervous/anxious.      Objective:  BP (!) 148/70   Pulse 60   Temp (!) 97 F (36.1 C)   Resp 16   Ht 5\' 6"  (1.676 m)   Wt 212 lb (96.2 kg)   BMI 34.22 kg/m   BP/Weight 11/16/2020 09/23/2020 77/82/4235  Systolic BP 361 443 154  Diastolic BP 70 90 90  Wt. (Lbs) 212 236  235  BMI 34.22 38.09 37.93    Physical Exam Vitals reviewed.  Constitutional:      Appearance: Normal appearance.  Neck:     Vascular: No carotid bruit.  Cardiovascular:     Rate and Rhythm: Normal rate and regular rhythm.     Pulses: Normal pulses.     Heart sounds: Normal heart sounds.  Pulmonary:     Effort: Pulmonary effort is normal.     Breath sounds: Normal breath sounds.  Abdominal:  General: Bowel sounds are normal.     Palpations: Abdomen is soft.     Tenderness: There is no abdominal tenderness.  Neurological:     Mental Status: She is alert and oriented to person, place, and time.  Psychiatric:        Mood and Affect: Mood normal.        Behavior: Behavior normal.     Lab Results  Component Value Date   WBC 3.4 11/11/2020   HGB 14.1 11/11/2020   HCT 43.2 11/11/2020   PLT 186 11/11/2020   GLUCOSE 96 11/11/2020   CHOL 209 (H) 11/11/2020   TRIG 162 (H) 11/11/2020   HDL 52 11/11/2020   LDLCALC 128 (H) 11/11/2020   ALT 18 11/11/2020   AST 19 11/11/2020   NA 142 11/11/2020   K 4.3 11/11/2020   CL 105 11/11/2020   CREATININE 0.86 11/11/2020   BUN 11 11/11/2020   CO2 23 11/11/2020   TSH 1.800 05/14/2020      Assessment & Plan:   1. Mixed hyperlipidemia Start Crestor 10 mg one twice a day.  Low fat diet .  2. Osteoporosis, postmenopausal Continue calcium citrate with D.  3. Gastroesophageal reflux disease without esophagitis Continue omeprazole.  4. Irritable bowel syndrome with constipation Increase fiber (metamucil, citracel) Increase water intake.   5. Other migraine without status migrainosus, intractable  6. Atherosclerosis of aorta ([place]) Needs crestor.  7. Dysuria - POCT urinalysis dipstick normal.  8. Palpitations EKG: Sinus bradycardia.  Ordering heart monitor for palpitations.   9. Rt hip pain - Call Dr. Tamala Julian for orthopedics.   Back pain - May take tylenol 500 mg 2 pills up to four times a day.   10. Allergic  rhinitis Recommend loratadine 10 mg once daily.    Orders Placed This Encounter  Procedures  . POCT urinalysis dipstick  . EKG 12-Lead     Follow-up: Return in about 4 weeks (around 12/14/2020).  An After Visit Summary was printed and given to the patient.  Rochel Brome, MD [facility] [phone]

## 2020-11-16 NOTE — Patient Instructions (Addendum)
Call Dr. Tamala Julian for orthopedics.  Start Crestor 10 mg one twice a day.  Increase fiber (metamucil, citracel) Increase water intake.  Recommend loratadine 10 mg once daily.  Back pain/hip pain: May take tylenol 500 mg 2 pills up to four times a day.  Ordering heart monitor for palpitations.

## 2020-11-17 ENCOUNTER — Encounter: Payer: Self-pay | Admitting: Family Medicine

## 2020-11-18 ENCOUNTER — Other Ambulatory Visit: Payer: Self-pay

## 2020-11-18 ENCOUNTER — Telehealth: Payer: Self-pay | Admitting: Family Medicine

## 2020-11-18 DIAGNOSIS — M545 Low back pain, unspecified: Secondary | ICD-10-CM

## 2020-11-18 MED ORDER — DIAZEPAM 5 MG PO TABS: ORAL_TABLET | ORAL | 0 refills | Status: DC

## 2020-11-18 NOTE — Telephone Encounter (Signed)
Sent in valium.  A little longer acting then ativan but should work.  Please have someone else drive though

## 2020-11-18 NOTE — Telephone Encounter (Signed)
Called patient to let her know MRI has been ordered. She can call [facility] to schedule at [phone].

## 2020-11-18 NOTE — Telephone Encounter (Signed)
Pt states at last visit, MRI was discussed. She has completed PT and actually feels worse, would like MRI ordered. Advised OV might be needed. R hip, groin, LBP

## 2020-11-18 NOTE — Telephone Encounter (Signed)
Spoke with patient regarding Valium and needing a driver.

## 2020-11-18 NOTE — Telephone Encounter (Signed)
Patient called back. I informed her that the MRI has been ordered and gave her [employer] to schedule. She said that she is claustrophobic and asked if something could be sent in for her. She has used Ativan in the past.  Please advise.

## 2020-11-22 DIAGNOSIS — R030 Elevated blood-pressure reading, without diagnosis of hypertension: Secondary | ICD-10-CM | POA: Insufficient documentation

## 2020-11-22 DIAGNOSIS — R002 Palpitations: Secondary | ICD-10-CM | POA: Diagnosis not present

## 2020-11-22 DIAGNOSIS — G4733 Obstructive sleep apnea (adult) (pediatric): Secondary | ICD-10-CM | POA: Insufficient documentation

## 2020-11-22 DIAGNOSIS — E785 Hyperlipidemia, unspecified: Secondary | ICD-10-CM | POA: Insufficient documentation

## 2020-11-22 DIAGNOSIS — M25551 Pain in right hip: Secondary | ICD-10-CM | POA: Insufficient documentation

## 2020-12-07 ENCOUNTER — Ambulatory Visit
Admission: RE | Admit: 2020-12-07 | Discharge: 2020-12-07 | Disposition: A | Discharge status: Home / Self Care | Payer: PPO | Source: Ambulatory Visit | Attending: Family Medicine | Admitting: Family Medicine

## 2020-12-07 DIAGNOSIS — M545 Low back pain, unspecified: Secondary | ICD-10-CM

## 2020-12-07 DIAGNOSIS — M48061 Spinal stenosis, lumbar region without neurogenic claudication: Secondary | ICD-10-CM | POA: Diagnosis not present

## 2020-12-09 DIAGNOSIS — I361 Nonrheumatic tricuspid (valve) insufficiency: Secondary | ICD-10-CM | POA: Diagnosis not present

## 2020-12-13 ENCOUNTER — Other Ambulatory Visit: Payer: PPO

## 2020-12-14 ENCOUNTER — Telehealth: Payer: Self-pay

## 2020-12-14 NOTE — Telephone Encounter (Signed)
   Patricia Leonard has been scheduled for the following appointment:  WHAT: Bone Density WHERE: [facility] imaging outpatient DATE: 01/10/2021 TIME: 11:30AM after her mammogram that as already set up.  Pt was sent a My Chart message to let her know her Bone Density was added on with her mammogram.

## 2020-12-22 ENCOUNTER — Ambulatory Visit: Payer: PPO | Admitting: Family Medicine

## 2020-12-28 ENCOUNTER — Other Ambulatory Visit: Payer: Self-pay

## 2020-12-28 ENCOUNTER — Ambulatory Visit (INDEPENDENT_AMBULATORY_CARE_PROVIDER_SITE_OTHER): Payer: PPO

## 2020-12-28 DIAGNOSIS — K219 Gastro-esophageal reflux disease without esophagitis: Secondary | ICD-10-CM

## 2020-12-28 DIAGNOSIS — E782 Mixed hyperlipidemia: Secondary | ICD-10-CM | POA: Diagnosis not present

## 2020-12-28 DIAGNOSIS — I700 Atherosclerosis of aorta: Secondary | ICD-10-CM

## 2020-12-28 DIAGNOSIS — M810 Age-related osteoporosis without current pathological fracture: Secondary | ICD-10-CM | POA: Diagnosis not present

## 2020-12-28 NOTE — Progress Notes (Signed)
Chronic Care Management Pharmacy Note  12/28/2020 Name:  Patricia Leonard MRN:  [id number] DOB:  Jun 08, 1951   Plan Recommendations:   Patient has not started Crestor. She plans to talk to Dr. Otho Perl about it on Monday during appointment. Patient is working on healthy eating and being more active.   Patient calling to schedule epidural with Dr. Tamala Julian as advised.   Patient has occasional low belly cramping that she isn't sure of origin. If it continues and unrelieved by dicyclomine, she will contact Dr. Helane Rima to be evaluated.  Patient is scheduled for Dexa scan this month at [facility].      Subjective: Patricia Leonard is an 70 y.o. year old female who is a primary patient of Cox, Kirsten, MD.  The CCM team was consulted for assistance with disease management and care coordination needs.    Engaged with patient by telephone for follow up visit in response to provider referral for pharmacy case management and/or care coordination services.   Consent to Services:  The patient was given the following information about Chronic Care Management services today, agreed to services, and gave verbal consent: 1. CCM service includes personalized support from designated clinical staff supervised by the primary care provider, including individualized plan of care and coordination with other care providers 2. 24/7 contact phone numbers for assistance for urgent and routine care needs. 3. Service will only be billed when office clinical staff spend 20 minutes or more in a month to coordinate care. 4. Only one practitioner may furnish and bill the service in a calendar month. 5.The patient may stop CCM services at any time (effective at the end of the month) by phone call to the office staff. 6. The patient will be responsible for cost sharing (co-pay) of up to 20% of the service fee (after annual deductible is met). Patient agreed to services and consent obtained.  Patient Care Team: Rochel Brome, MD  as PCP - General (Family Medicine) Jackquline Denmark, MD as Consulting Physician (Gastroenterology) Burnice Logan, [facility] as Pharmacist (Pharmacist)  Recent office visits: 11/16/2020 - start Crestor 10 mg twice weekly? Increase fiber and water. Ordering heart monitor for palpitations. Recommend loratadine 10 mg daily.  10/12/2019 - continue mucinex. Ordered rapid COVID test. Negatvie test.  05/14/2020 - voltaren gel OTC for joint pain. No aleve may use tylenol. Kenalog shot. Call hand surgeon to call back. Get COVID vaccine.   Recent consult visits: 11/22/2020 - Cardio - encouraged low salt diet. Ordered echocadiogram. Encouraged regular exercise. Recommend trying CPAP again.  09/23/2020 - sport's medicine - physical therapy. Gabapentin continued.  08/23/2020 - Sport's medicine - xrays today. Ice 20 minutes bid. Exercises three times a week. Gabapentin 200 mg at night but can start 100 mg night. Tart Cherry Extract 1200 mg at night. Vitamin D 2000 IU daily.  06/07/2020 - general surgeon - constipation. Refer to pelvic floor PT   Hospital visits: None in previous 6 months  Objective:  Lab Results  Component Value Date   CREATININE 0.86 11/11/2020   BUN 11 11/11/2020   GFR 65.95 01/19/2020   GFRNONAA 68 11/11/2020   GFRAA 79 11/11/2020   NA 142 11/11/2020   K 4.3 11/11/2020   CALCIUM 9.0 11/11/2020   CO2 23 11/11/2020    Lab Results  Component Value Date/Time   GFR 65.95 01/19/2020 12:32 PM    Last diabetic Eye exam: No results found for: HMDIABEYEEXA  Last diabetic Foot exam: No results found for: HMDIABFOOTEX  Lab Results  Component Value Date   CHOL 209 (H) 11/11/2020   HDL 52 11/11/2020   LDLCALC 128 (H) 11/11/2020   TRIG 162 (H) 11/11/2020   CHOLHDL 4.0 11/11/2020    Hepatic Function Latest Ref Rng & Units 11/11/2020 05/14/2020 01/19/2020  Total Protein 6.0 - 8.5 g/dL 6.0 6.5 7.1  Albumin 3.7 - 4.7 g/dL 4.0 4.1 4.2  AST 0 - 40 IU/L 19 21 19   ALT 0 - 32 IU/L 18 19 18    Alk Phosphatase 44 - 121 IU/L 79 88 77  Total Bilirubin 0.0 - 1.2 mg/dL 0.6 0.5 0.6    Lab Results  Component Value Date/Time   TSH 1.800 05/14/2020 09:34 AM    CBC Latest Ref Rng & Units 11/11/2020 05/14/2020 01/19/2020  WBC 3.4 - 10.8 x10E3/uL 3.4 3.9 5.0  Hemoglobin 11.1 - 15.9 g/dL 14.1 14.0 14.2  Hematocrit 34.0 - 46.6 % 43.2 41.3 42.3  Platelets 150 - 450 x10E3/uL 186 216 199.0    No results found for: VD25OH  Clinical ASCVD: Yes  The 10-year ASCVD risk score Mikey Bussing DC Jr., et al., 2013) is: 12.7%   Values used to calculate the score:     Age: 70 years     Sex: Female     Is Non-Hispanic African American: No     Diabetic: No     Tobacco smoker: No     Systolic Blood Pressure: 72 mmHg     Is BP treated: No     HDL Cholesterol: 52 mg/dL     Total Cholesterol: 209 mg/dL    Depression screen [facility] 2/9 11/16/2020  Decreased Interest 0  Down, Depressed, Hopeless 0  PHQ - 2 Score 0     Social History   Tobacco Use  Smoking Status Never Smoker  Smokeless Tobacco Never Used   BP Readings from Last 3 Encounters:  11/16/20 (!) 148/70  09/23/20 (!) 152/90  08/23/20 124/90   Pulse Readings from Last 3 Encounters:  11/16/20 60  09/23/20 65  08/23/20 68   Wt Readings from Last 3 Encounters:  11/16/20 212 lb (96.2 kg)  09/23/20 236 lb (107 kg)  08/23/20 235 lb (106.6 kg)    Assessment/Interventions: Review of patient past medical history, allergies, medications, health status, including review of consultants reports, laboratory and other test data, was performed as part of comprehensive evaluation and provision of chronic care management services.   SDOH:  (Social Determinants of Health) assessments and interventions performed: Yes   CCM Care Plan  Allergies  Allergen Reactions  . Amoxicillin-Pot Clavulanate     Stomach pain   . Iohexol      Desc: HAD FACIAL RASH WITH CT CONTRAST IN 2006     Medications Reviewed Today    Reviewed by Burnice Logan, [facility]  (Pharmacist) on 12/28/20 at 1418  Med List Status: <None>  Medication Order Taking? Sig Documenting Provider Last Dose Status Informant  calcium citrate-vitamin D (CITRACAL+D) 315-200 MG-UNIT tablet [id number] Yes Take 1 tablet by mouth 2 (two) times daily. [provider] Taking Active   dicyclomine (BENTYL) 10 MG capsule [id number] Yes Take 1 capsule (10 mg total) by mouth 3 (three) times daily before meals. As needed Esterwood, Amy S, PA-C Taking Active   gabapentin (NEURONTIN) 100 MG capsule [id number] Yes Take 2 capsules (200 mg total) by mouth at bedtime. Lyndal Pulley, DO Taking Active   magnesium hydroxide (MILK OF MAGNESIA) 400 MG/5ML suspension [id number] Yes Take 15 mLs by mouth  daily as needed for mild constipation. [provider] Taking Active   omeprazole (PRILOSEC) 40 MG capsule [id number] Yes Take 1 capsule (40 mg total) by mouth in the morning and at bedtime. Jackquline Denmark, MD Taking Active   rosuvastatin (CRESTOR) 10 MG tablet [id number] No TAKE 1 TABLET BY MOUTH EVERY DAY  Patient not taking: No sig reported   CoxElnita Maxwell, MD Not Taking Active           Patient Active Problem List   Diagnosis Date Noted  . Greater trochanteric bursitis of right hip 09/23/2020  . Cervical disc disorder with radiculopathy of cervical region 08/23/2020  . Lumbar radiculopathy 08/23/2020  . Rectocele 02/09/2020  . Mixed hyperlipidemia 02/09/2020  . Aortic atherosclerosis ([place]) 02/09/2020  . Irritable bowel syndrome with constipation 02/09/2020  . Hiatal hernia 02/09/2020  . Migraine 02/09/2020  . GERD (gastroesophageal reflux disease) 01/20/2020  . History of IBS 01/20/2020  . Hx of adenomatous colonic polyps 01/20/2020  . Nuclear cataract 09/13/2011  . Hyperopia with astigmatism 09/13/2011     There is no immunization history on file for this patient.  Conditions to be addressed/monitored:  Hyperlipidemia, GERD and IBS-C.   Care Plan : [place]   Updates made by Burnice Logan, RPH since 12/28/2020 12:00 AM    Problem: IBS, GERD, Hyperlipidemia   Priority: High  Onset Date: 11/08/2020    Long-Range Goal: disease management   Start Date: 11/08/2020  Expected End Date: 11/08/2021  Recent Progress: On track  Priority: High  Note:   Current Barriers:  . Unable to achieve control of cholesterol    Pharmacist Clinical Goal(s):  Marland Kitchen Over the next 90 days, patient will achieve control of cholesterol  as evidenced by lipid panel . adhere to plan to optimize therapeutic regimen for cholesterol as evidenced by report of adherence to recommended medication management changes through collaboration with PharmD and provider.      Interventions: . 1:1 collaboration with Rochel Brome, MD regarding development and update of comprehensive plan of care as evidenced by provider attestation and co-signature . Inter-disciplinary care team collaboration (see longitudinal plan of care) . Comprehensive medication review performed; medication list updated in electronic medical record  Hyperlipidemia: (LDL goal < 100) -uncontrolled -Current treatment: . Crestor 10 mg twice a week - but has not started. Waiting to talk to cardiologist.  -Medications previously tried: red yeast rice  -Current dietary patterns: likes vegetables -Current exercise habits: walking her dog and walking at the beach.  -Educated on Cholesterol goals;  Benefits of statin for ASCVD risk reduction; Importance of limiting foods high in cholesterol; Exercise goal of 150 minutes per week; -Counseled on diet and exercise extensively  -Patient has not taken Crestor - wants to wait and talk to Dr. Otho Perl about Crestor before starting it. Discussed and recommended starting twice a week.   Osteoporosis / Osteopenia (Goal get updated Dexa Scan to assess bones) -uncertain control -Last DEXA Scan: scheduled for 12/2020 -Current treatment  . Calcium citrate D bid - recommended patient begin  due to PPI -Medications previously tried: calcium with D  -Recommend [phone] units of vitamin D daily. Recommend 1200 mg of calcium daily from dietary and supplemental sources. Recommend weight-bearing and muscle strengthening exercises for building and maintaining bone density. -Counseled on diet and exercise extensively Recommended Calicum citrate with D twice daily and updated Dexa scan.   -Needs updated Dexa scan - scheduled 12/2020 [facility]  IBS-C (Goal: manage constipation and stomach  cramping) -controlled -Current treatment  . dicyclomine 10 mg tid . Librax tid before meals -Medications previously tried: none reported  -Counseled on diet and exercise extensively Recommended to continue current medication. Patient reports occasional low belly pain that isn't sure of the origin. Reports that her mom died of cancer and she follows with OBGYN annually and encouraged patient to call for sooner appointment if has the cramping pain again.   GERD (Goal: symptom control/management) -uncontrolled -Current treatment  . omeprazole 40 mg twice daily  -Medications previously tried: none reported  -Recommended to continue current medication Counseled on small meals, elevating head of bed and avoiding tight waistbands - Large hiatal hernia - has to use caution eating too much.   Pain  (Goal: manage pain) -controlled -Current treatment  . gabapentin 100 mg - 2 capsules at bedtime -Medications previously tried: none reported  -Counseled on diet and exercise extensively Recommended to continue current medication  Calling Dr. Tamala Julian to schedule epidural for pain relief.   Health Maintenance -Vaccine gaps: none on file   -Patient is satisfied with current therapy and denies issues -Counseled on diet and exercise extensively Recommended to continue current medication   Patient Goals/Self-Care Activities . Over the next 90 days, patient will:  - take medications as prescribed  Follow Up  Plan: Telephone follow up appointment with care management team member scheduled for: 06/2021      Medication Assistance: None required.  Patient affirms current coverage meets needs.  Patient's preferred pharmacy is:  [id number] [address] [address]Phone: [phone] Fax: [phone] Uses pill box? Yes Pt endorses 100% compliance  We discussed: Current pharmacy is preferred with insurance plan and patient is satisfied with pharmacy services Patient decided to: Continue current medication management strategy  Care Plan and Follow Up Patient Decision:  Patient agrees to Care Plan and Follow-up.  Plan: Telephone follow up appointment with care management team member scheduled for:  06/2021

## 2020-12-28 NOTE — Patient Instructions (Addendum)
Visit Information  Goals Addressed            This Visit's Progress   . Learn More About My Health   On track    Timeframe:  Long-Range Goal Priority:  High Start Date:               11/08/2020              Expected End Date:                 11/08/2021       Follow Up Date 12/28/2020    - make a list of questions - ask questions - bring a list of my medicines to the visit - speak up when I don't understand    Why is this important?    The best way to learn about your health and care is by talking to the doctor and nurse.   They will answer your questions and give you information in the way that you like best.    Notes:     Marland Kitchen Manage My Diet-Irritable Bowel Syndrome   On track    Timeframe:  Long-Range Goal Priority:  High Start Date:        11/08/2020                     Expected End Date:          11/08/2021             Follow Up Date 12/28/2020    - avoid foods that make my symptoms worse - eat 5 or 6 small meals each day    Why is this important?    Changing what you eat and drink may help you to manage your condition.   It is important to take it slow, making 1 change at a time.   A dietitian is the best person to guide you.    Notes:     Marland Kitchen Manage My Medicine   On track    Timeframe:  Long-Range Goal Priority:  High Start Date:       11/08/2020                      Expected End Date:         11/08/2021              Follow Up Date 12/28/2020    - call for medicine refill 2 or 3 days before it runs out - keep a list of all the medicines I take; vitamins and herbals too - use a pillbox to sort medicine    Why is this important?   . These steps will help you keep on track with your medicines.   Notes:       Patient Care Plan: CCM Pharmacy Care Plan    Problem Identified: IBS, GERD, Hyperlipidemia   Priority: High  Onset Date: 11/08/2020    Long-Range Goal: disease management   Start Date: 11/08/2020  Expected End Date: 11/08/2021  Recent  Progress: On track  Priority: High  Note:   Current Barriers:  . Unable to achieve control of cholesterol    Pharmacist Clinical Goal(s):  Marland Kitchen Over the next 90 days, patient will achieve control of cholesterol  as evidenced by lipid panel . adhere to plan to optimize therapeutic regimen for cholesterol as evidenced by report of adherence to recommended medication management changes through collaboration with PharmD and provider.  Interventions: . 1:1 collaboration with Rochel Brome, MD regarding development and update of comprehensive plan of care as evidenced by provider attestation and co-signature . Inter-disciplinary care team collaboration (see longitudinal plan of care) . Comprehensive medication review performed; medication list updated in electronic medical record  Hyperlipidemia: (LDL goal < 100) -uncontrolled -Current treatment: . Crestor 10 mg twice a week - but has not started. Waiting to talk to cardiologist.  -Medications previously tried: red yeast rice  -Current dietary patterns: likes vegetables -Current exercise habits: walking her dog and walking at the beach.  -Educated on Cholesterol goals;  Benefits of statin for ASCVD risk reduction; Importance of limiting foods high in cholesterol; Exercise goal of 150 minutes per week; -Counseled on diet and exercise extensively  -Patient has not taken Crestor - wants to wait and talk to Dr. Otho Perl about Crestor before starting it. Discussed and recommended starting twice a week.   Osteoporosis / Osteopenia (Goal get updated Dexa Scan to assess bones) -uncertain control -Last DEXA Scan: scheduled for 12/2020 -Current treatment  . Calcium citrate D bid - recommended patient begin due to PPI -Medications previously tried: calcium with D  -Recommend [phone] units of vitamin D daily. Recommend 1200 mg of calcium daily from dietary and supplemental sources. Recommend weight-bearing and muscle strengthening exercises for  building and maintaining bone density. -Counseled on diet and exercise extensively Recommended Calicum citrate with D twice daily and updated Dexa scan.   -Needs updated Dexa scan - scheduled 12/[facility]  IBS-C (Goal: manage constipation and stomach cramping) -controlled -Current treatment  . dicyclomine 10 mg tid . Librax tid before meals -Medications previously tried: none reported  -Counseled on diet and exercise extensively Recommended to continue current medication. Patient reports occasional low belly pain that isn't sure of the origin. Reports that her mom died of cancer and she follows with OBGYN annually and encouraged patient to call for sooner appointment if has the cramping pain again.   GERD (Goal: symptom control/management) -uncontrolled -Current treatment  . omeprazole 40 mg twice daily  -Medications previously tried: none reported  -Recommended to continue current medication Counseled on small meals, elevating head of bed and avoiding tight waistbands - Large hiatal hernia - has to use caution eating too much.   Pain  (Goal: manage pain) -controlled -Current treatment  . gabapentin 100 mg - 2 capsules at bedtime -Medications previously tried: none reported  -Counseled on diet and exercise extensively Recommended to continue current medication  Calling Dr. Tamala Julian to schedule epidural for pain relief.   Health Maintenance -Vaccine gaps: none on file   -Patient is satisfied with current therapy and denies issues -Counseled on diet and exercise extensively Recommended to continue current medication   Patient Goals/Self-Care Activities . Over the next 90 days, patient will:  - take medications as prescribed  Follow Up Plan: Telephone follow up appointment with care management team member scheduled for: 06/2021      The patient verbalized understanding of instructions, educational materials, and care plan provided today and declined offer to receive copy of  patient instructions, educational materials, and care plan.  Telephone follow up appointment with pharmacy team member scheduled for: 06/29/2021  Burnice Logan, [facility]  Heart-Healthy Eating Plan Heart-healthy meal planning includes:  Eating less unhealthy fats.  Eating more healthy fats.  Making other changes in your diet. Talk with your doctor or a diet specialist (dietitian) to create an eating plan that is right for you. What is my plan? Your  doctor may recommend an eating plan that includes:  Total fat: ______% or less of total calories a day.  Saturated fat: ______% or less of total calories a day.  Cholesterol: less than _________mg a day. What are tips for following this plan? Cooking Avoid frying your food. Try to bake, boil, grill, or broil it instead. You can also reduce fat by:  Removing the skin from poultry.  Removing all visible fats from meats.  Steaming vegetables in water or broth. Meal planning  At meals, divide your plate into four equal parts: ? Fill one-half of your plate with vegetables and green salads. ? Fill one-fourth of your plate with whole grains. ? Fill one-fourth of your plate with lean protein foods.  Eat 4-5 servings of vegetables per day. A serving of vegetables is: ? 1 cup of raw or cooked vegetables. ? 2 cups of raw leafy greens.  Eat 4-5 servings of fruit per day. A serving of fruit is: ? 1 medium whole fruit. ?  cup of dried fruit. ?  cup of fresh, frozen, or canned fruit. ?  cup of 100% fruit juice.  Eat more foods that have soluble fiber. These are apples, broccoli, carrots, beans, peas, and barley. Try to get 20-30 g of fiber per day.  Eat 4-5 servings of nuts, legumes, and seeds per week: ? 1 serving of dried beans or legumes equals  cup after being cooked. ? 1 serving of nuts is  cup. ? 1 serving of seeds equals 1 tablespoon.   General information  Eat more home-cooked food. Eat less restaurant, buffet, and fast  food.  Limit or avoid alcohol.  Limit foods that are high in starch and sugar.  Avoid fried foods.  Lose weight if you are overweight.  Keep track of how much salt (sodium) you eat. This is important if you have high blood pressure. Ask your doctor to tell you more about this.  Try to add vegetarian meals each week. Fats  Choose healthy fats. These include olive oil and canola oil, flaxseeds, walnuts, almonds, and seeds.  Eat more omega-3 fats. These include salmon, mackerel, sardines, tuna, flaxseed oil, and ground flaxseeds. Try to eat fish at least 2 times each week.  Check food labels. Avoid foods with trans fats or high amounts of saturated fat.  Limit saturated fats. ? These are often found in animal products, such as meats, butter, and cream. ? These are also found in plant foods, such as palm oil, palm kernel oil, and coconut oil.  Avoid foods with partially hydrogenated oils in them. These have trans fats. Examples are stick margarine, some tub margarines, cookies, crackers, and other baked goods. What foods can I eat? Fruits All fresh, canned (in natural juice), or frozen fruits. Vegetables Fresh or frozen vegetables (raw, steamed, roasted, or grilled). Green salads. Grains Most grains. Choose whole wheat and whole grains most of the time. Rice and pasta, including [place] rice and pastas made with whole wheat. Meats and other proteins Lean, well-trimmed beef, veal, pork, and lamb. Chicken and [place] without skin. All fish and shellfish. Wild duck, rabbit, pheasant, and venison. Egg whites or low-cholesterol egg substitutes. Dried beans, peas, lentils, and tofu. Seeds and most nuts. Dairy Low-fat or nonfat cheeses, including ricotta and mozzarella. Skim or 1% milk that is liquid, powdered, or evaporated. Buttermilk that is made with low-fat milk. Nonfat or low-fat yogurt. Fats and oils Non-hydrogenated (trans-free) margarines. Vegetable oils, including soybean, sesame,  sunflower, olive, peanut, safflower,  corn, canola, and cottonseed. Salad dressings or mayonnaise made with a vegetable oil. Beverages Mineral water. Coffee and tea. Diet carbonated beverages. Sweets and desserts Sherbet, gelatin, and fruit ice. Small amounts of dark chocolate. Limit all sweets and desserts. Seasonings and condiments All seasonings and condiments. The items listed above may not be a complete list of foods and drinks you can eat. Contact a dietitian for more options. What foods should I avoid? Fruits Canned fruit in heavy syrup. Fruit in cream or butter sauce. Fried fruit. Limit coconut. Vegetables Vegetables cooked in cheese, cream, or butter sauce. Fried vegetables. Grains Breads that are made with saturated or trans fats, oils, or whole milk. Croissants. Sweet rolls. Donuts. High-fat crackers, such as cheese crackers. Meats and other proteins Fatty meats, such as hot dogs, ribs, sausage, bacon, rib-eye roast or steak. High-fat deli meats, such as salami and bologna. Caviar. Domestic duck and goose. Organ meats, such as liver. Dairy Cream, sour cream, cream cheese, and creamed cottage cheese. Whole-milk cheeses. Whole or 2% milk that is liquid, evaporated, or condensed. Whole buttermilk. Cream sauce or high-fat cheese sauce. Yogurt that is made from whole milk. Fats and oils Meat fat, or shortening. Cocoa butter, hydrogenated oils, palm oil, coconut oil, palm kernel oil. Solid fats and shortenings, including bacon fat, salt pork, lard, and butter. Nondairy cream substitutes. Salad dressings with cheese or sour cream. Beverages Regular sodas and juice drinks with added sugar. Sweets and desserts Frosting. Pudding. Cookies. Cakes. Pies. Milk chocolate or white chocolate. Buttered syrups. Full-fat ice cream or ice cream drinks. The items listed above may not be a complete list of foods and drinks to avoid. Contact a dietitian for more information. Summary  Heart-healthy  meal planning includes eating less unhealthy fats, eating more healthy fats, and making other changes in your diet.  Eat a balanced diet. This includes fruits and vegetables, low-fat or nonfat dairy, lean protein, nuts and legumes, whole grains, and heart-healthy oils and fats. This information is not intended to replace advice given to you by your health care provider. Make sure you discuss any questions you have with your health care provider. Document Revised: 11/15/2017 Document Reviewed: 10/19/2017 Elsevier Patient Education  2021 [employer].

## 2020-12-29 DIAGNOSIS — R002 Palpitations: Secondary | ICD-10-CM | POA: Diagnosis not present

## 2020-12-30 DIAGNOSIS — I493 Ventricular premature depolarization: Secondary | ICD-10-CM | POA: Diagnosis not present

## 2020-12-30 DIAGNOSIS — I491 Atrial premature depolarization: Secondary | ICD-10-CM | POA: Diagnosis not present

## 2020-12-30 DIAGNOSIS — I471 Supraventricular tachycardia: Secondary | ICD-10-CM | POA: Diagnosis not present

## 2020-12-30 DIAGNOSIS — I472 Ventricular tachycardia: Secondary | ICD-10-CM | POA: Diagnosis not present

## 2021-01-03 DIAGNOSIS — G4733 Obstructive sleep apnea (adult) (pediatric): Secondary | ICD-10-CM | POA: Diagnosis not present

## 2021-01-03 DIAGNOSIS — R002 Palpitations: Secondary | ICD-10-CM | POA: Diagnosis not present

## 2021-01-03 DIAGNOSIS — I358 Other nonrheumatic aortic valve disorders: Secondary | ICD-10-CM | POA: Insufficient documentation

## 2021-01-03 DIAGNOSIS — E785 Hyperlipidemia, unspecified: Secondary | ICD-10-CM | POA: Diagnosis not present

## 2021-01-04 ENCOUNTER — Other Ambulatory Visit: Payer: Self-pay

## 2021-01-04 ENCOUNTER — Telehealth: Payer: Self-pay | Admitting: Family Medicine

## 2021-01-04 DIAGNOSIS — M5416 Radiculopathy, lumbar region: Secondary | ICD-10-CM

## 2021-01-04 NOTE — Telephone Encounter (Signed)
Ordered and patient notified.  

## 2021-01-04 NOTE — Telephone Encounter (Signed)
Patient called in response to her MRI results from Dr Tamala Julian. She would like to continue with the epidural and asked if an order could be placed for her.  Please advise.

## 2021-01-10 DIAGNOSIS — M8589 Other specified disorders of bone density and structure, multiple sites: Secondary | ICD-10-CM | POA: Diagnosis not present

## 2021-01-10 DIAGNOSIS — Z1231 Encounter for screening mammogram for malignant neoplasm of breast: Secondary | ICD-10-CM | POA: Diagnosis not present

## 2021-01-10 DIAGNOSIS — M810 Age-related osteoporosis without current pathological fracture: Secondary | ICD-10-CM | POA: Diagnosis not present

## 2021-01-10 LAB — HM MAMMOGRAPHY

## 2021-01-12 ENCOUNTER — Ambulatory Visit: Payer: PPO | Admitting: Family Medicine

## 2021-01-17 ENCOUNTER — Telehealth: Payer: Self-pay

## 2021-01-17 MED ORDER — PREDNISONE 50 MG PO TABS: ORAL_TABLET | ORAL | 0 refills | Status: DC

## 2021-01-17 NOTE — Telephone Encounter (Signed)
Phone call to patient to review instructions for 13 hr prep for Injection with CT contrast on 01/20/21 at 10:00 AM. Prescription e-scribed into [facility]. Pt aware and verbalized understanding of instructions.  Prescription: Pt to take 50 mg of prednisone on 01/19/21 at 9:00 PM, 50 mg of prednisone on 01/20/21 at 3:00 AM, and 50 mg of prednisone on 01/20/21 at 9:00 AM. Pt is also to take 50 mg of benadryl on 01/20/21 at 9:00 AM. Please call [phone] with any questions.  Pt reports she has 50 mg of benadryl at home and wished for this not to be called in as a prescription. Pt verbalizes understanding of when to take the 50 mg of benadryl.

## 2021-01-19 ENCOUNTER — Other Ambulatory Visit: Payer: Self-pay

## 2021-01-19 ENCOUNTER — Ambulatory Visit (INDEPENDENT_AMBULATORY_CARE_PROVIDER_SITE_OTHER): Payer: PPO

## 2021-01-19 VITALS — BP 132/80 | HR 65 | Temp 98.8°F | Resp 16 | Ht 66.0 in | Wt 233.4 lb

## 2021-01-19 DIAGNOSIS — G4733 Obstructive sleep apnea (adult) (pediatric): Secondary | ICD-10-CM

## 2021-01-19 DIAGNOSIS — Z6837 Body mass index (BMI) 37.0-37.9, adult: Secondary | ICD-10-CM

## 2021-01-19 DIAGNOSIS — Z0000 Encounter for general adult medical examination without abnormal findings: Secondary | ICD-10-CM

## 2021-01-19 NOTE — Progress Notes (Addendum)
Subjective:   Patricia Leonard is a 70 y.o. female who presents for Medicare Annual (Subsequent) preventive examination.  This wellness visit is conducted by a nurse.  The patient's medications were reviewed and reconciled since the patient's last visit.  History details were provided by the patient.  The history appears to be reliable.  Patricia Leonard is still adjusting to taking on all responsibilities at home since her husbands passing.    Patient's last AWV was two years ago.   Medical History: Patient history and Family history was reviewed  Medications, Allergies, and preventative health maintenance was reviewed and updated.   Review of Systems    Review of Systems  Constitutional: Negative.   HENT: Negative.   Respiratory: Negative.  Negative for cough, chest tightness and shortness of breath.   Cardiovascular: Positive for palpitations. Negative for chest pain.       Followed by Dr Otho Perl  Gastrointestinal: Negative.   Genitourinary: Negative.   Musculoskeletal: Positive for back pain.  Neurological: Negative for dizziness and headaches.  Psychiatric/Behavioral: Positive for sleep disturbance. Negative for confusion, dysphoric mood and suicidal ideas.       Insomnia due to pain   Cardiac Risk Factors include: advanced age (>55men, >61 women);dyslipidemia;hypertension;obesity (BMI >30kg/m2)     Objective:     01/19/21 1356  BP: 132/80  Pulse: 65  Resp: 16  Temp: 98.8 F (37.1 C)  SpO2: 97%  Weight: 233 lb 6.4 oz (105.9 kg)  Height: 5\' 6"  (1.676 m)  PainSc:    Body mass index is 37.67 kg/m.  Advanced Directives 01/19/2021 01/27/2020 04/03/2018  Does Patient Have a Medical Advance Directive? Yes Yes Yes  Type of Paramedic of [place];Living will [place];Living will Living will  Does patient want to make changes to medical advance directive? No - Patient declined - -  Copy of [place] in Chart? No - copy requested  - -    Current Medications (verified) Outpatient Encounter Medications as of 01/19/2021  Medication Sig  . Biotin 10 MG CAPS Take by mouth.  . calcium citrate-vitamin D (CITRACAL+D) 315-200 MG-UNIT tablet Take 1 tablet by mouth 2 (two) times daily.  . calcium-vitamin D (OSCAL WITH D) 500-200 MG-UNIT TABS tablet Take by mouth.  . clotrimazole-betamethasone (LOTRISONE) cream Apply topically.  . cyanocobalamin 1000 MCG tablet Take by mouth.  . dicyclomine (BENTYL) 10 MG capsule Take 1 capsule (10 mg total) by mouth 3 (three) times daily before meals. As needed  . gabapentin (NEURONTIN) 100 MG capsule Take 2 capsules (200 mg total) by mouth at bedtime.  . magnesium hydroxide (MILK OF MAGNESIA) 400 MG/5ML suspension Take 15 mLs by mouth daily as needed for mild constipation.  Marland Kitchen omeprazole (PRILOSEC) 40 MG capsule Take 1 capsule (40 mg total) by mouth in the morning and at bedtime.  . predniSONE (DELTASONE) 20 MG tablet prednisone 20 mg tablet  TAKE TWO TABLETS BY MOUTH DAILY  . predniSONE (DELTASONE) 50 MG tablet Pt to take 50 mg of prednisone on 01/19/21 at 9:00 PM, 50 mg of prednisone on 01/20/21 at 3:00 AM, and 50 mg of prednisone on 01/20/21 at 9:00 AM. Pt is also to take 50 mg of benadryl on 01/20/21 at 9:00 AM. Please call [phone] with any questions.  . pyridoxine (B-6) 100 MG tablet Take by mouth.  . rosuvastatin (CRESTOR) 10 MG tablet TAKE 1 TABLET BY MOUTH EVERY DAY   No facility-administered encounter medications on file as of  01/19/2021.    Allergies (verified) Amoxicillin-pot clavulanate and Iohexol   History: Past Medical History:  Diagnosis Date  . Allergy   . Anemia   . Anxiety   . Aortic atherosclerosis ([place])   . Arthritis   . Cataract   . Depression   . Family history of colon cancer   . Fibromyalgia   . GERD (gastroesophageal reflux disease)   . Hyperlipidemia   . IBS (irritable bowel syndrome)   . Rectal prolapse   . Stomach ulcer   . Tick bite    took  antibiotics memorial day weekend 2021 on back   Past Surgical History:  Procedure Laterality Date  . APPENDECTOMY    . CESAREAN SECTION    . CHOLECYSTECTOMY    . COLONOSCOPY  04/18/2014   Diverticulosis.   Marland Kitchen ESOPHAGOGASTRODUODENOSCOPY  04/14/2016   Schatzkis ring. Hiatal hernia. Gastritis.   Marland Kitchen LIPOMA EXCISION    . thermal ablation    . TONSILLECTOMY     Family History  Problem Relation Age of Onset  . Colon cancer Mother   . Liver cancer Father   . CAD Father   . Hypertension Sister   . Colon cancer Sister   . Hyperlipidemia Brother   . Gout Brother   . Diabetes Maternal Grandmother   . Diabetes Paternal Grandmother   . Heart attack Other   . Cancer Other        Leiomyosarcome and Liver  . Migraines Other   . Cancer Maternal Aunt        "female cancer, not breast"  . Cancer Paternal Aunt        "female cancer, not breast"  . Colon polyps Neg Hx   . Esophageal cancer Neg Hx   . Pancreatic cancer Neg Hx   . Rectal cancer Neg Hx   . Stomach cancer Neg Hx    Social History   Socioeconomic History  . Marital status: Widowed    Spouse name: Not on file  . Number of children: Not on file  . Years of education: Not on file  . Highest education level: Not on file  Occupational History  . Not on file  Tobacco Use  . Smoking status: Never Smoker  . Smokeless tobacco: Never Used  Vaping Use  . Vaping Use: Never used  Substance and Sexual Activity  . Alcohol use: Never  . Drug use: Never  . Sexual activity: Not on file  Other Topics Concern  . Not on file  Social History Narrative  . Not on file   Social Determinants of Health   Financial Resource Strain: Not on file  Food Insecurity: No Food Insecurity  . Worried About Charity fundraiser in the Last Year: Never true  . Ran Out of Food in the Last Year: Never true  Transportation Needs: No Transportation Needs  . Lack of Transportation (Medical): No  . Lack of Transportation (Non-Medical): No  Physical  Activity: Not on file  Stress: Not on file  Social Connections: Not on file   Tobacco Counseling Counseling given: Not Answered  Clinical Intake:  Pre-visit preparation completed: Yes  Pain : 0-10 Pain Score: 5  Pain Type: Chronic pain (Back pain) Pain Location: Other (Comment) (back and  hip) Pain Descriptors / Indicators: Aching,Restless Pain Onset: More than a month ago Pain Frequency: Constant Effect of Pain on Daily Activities: moderate     BMI - recorded: 37.67 Nutritional Status: BMI > 30  Obese Nutritional Risks: None  Diabetes: No  How often do you need to have someone help you when you read instructions, pamphlets, or other written materials from your doctor or pharmacy?: 1 - Never  Interpreter Needed?: No   Activities of Daily Living In your present state of health, do you have any difficulty performing the following activities: 01/19/2021 02/09/2020  Hearing? N N  Vision? N N  Difficulty concentrating or making decisions? N N  Walking or climbing stairs? Y N  Comment due to back and hip pain -  Dressing or bathing? N N  Doing errands, shopping? N N  Preparing Food and eating ? N -  Using the Toilet? N -  In the past six months, have you accidently leaked urine? N -  Do you have problems with loss of bowel control? N -  Managing your Medications? N -  Managing your Finances? N -  Housekeeping or managing your Housekeeping? N -  Some recent data might be hidden    Patient Care Team: Rochel Brome, MD as PCP - General (Family Medicine) Jackquline Denmark, MD as Consulting Physician (Gastroenterology) Dian Queen, MD as Consulting Physician (Obstetrics and Gynecology) Lyndal Pulley, DO as Consulting Physician (Family Medicine) Burnice Logan, [facility] as Pharmacist (Pharmacist) Flossie Buffy., MD as Referring Physician (Cardiology)  Indicate any recent Medical Services you may have received from other than [facility] providers in the past year (date may be  approximate).     Assessment:   This is a routine wellness examination for [place].  Hearing/Vision screen No exam data present  Dietary issues and exercise activities discussed: Current Exercise Habits: The patient does not participate in regular exercise at present, Exercise limited by: Other - see comments (back and hip pain)  Goals    . Learn More About My Health     Timeframe:  Long-Range Goal Priority:  High Start Date:               11/08/2020              Expected End Date:                 11/08/2021       Follow Up Date 12/28/2020    - make a list of questions - ask questions - bring a list of my medicines to the visit - speak up when I don't understand    Why is this important?    The best way to learn about your health and care is by talking to the doctor and nurse.   They will answer your questions and give you information in the way that you like best.    Notes:     Marland Kitchen Manage My Diet-Irritable Bowel Syndrome     Timeframe:  Long-Range Goal Priority:  High Start Date:        11/08/2020                     Expected End Date:          11/08/2021             Follow Up Date 12/28/2020    - avoid foods that make my symptoms worse - eat 5 or 6 small meals each day    Why is this important?    Changing what you eat and drink may help you to manage your condition.   It is important to take it slow, making 1 change at a time.  A dietitian is the best person to guide you.    Notes:     Marland Kitchen Manage My Medicine     Timeframe:  Long-Range Goal Priority:  High Start Date:       11/08/2020                      Expected End Date:         11/08/2021              Follow Up Date 12/28/2020    - call for medicine refill 2 or 3 days before it runs out - keep a list of all the medicines I take; vitamins and herbals too - use a pillbox to sort medicine    Why is this important?   . These steps will help you keep on track with your medicines.   Notes:        Depression Screen PHQ 2/9 Scores 01/19/2021 11/16/2020  PHQ - 2 Score 0 0    Fall Risk Fall Risk  01/19/2021 11/16/2020 02/09/2020  Falls in the past year? 0 0 0  Number falls in past yr: 0 0 0  Injury with Fall? 0 0 0  Risk for fall due to : No Fall Risks - No Fall Risks  Follow up Falls evaluation completed;Falls prevention discussed - Falls evaluation completed;Falls prevention discussed    FALL RISK PREVENTION PERTAINING TO THE HOME:  Any stairs in or around the home? Yes  If so, are there any without handrails? No  Home free of loose throw rugs in walkways, pet beds, electrical cords, etc? Yes  Adequate lighting in your home to reduce risk of falls? Yes   ASSISTIVE DEVICES UTILIZED TO PREVENT FALLS:  Life alert? No  Use of a cane, walker or w/c? No  Grab bars in the bathroom? Yes  Shower chair or bench in shower? Yes  Elevated toilet seat or a handicapped toilet? No  Gait slow and steady without use of assistive device  Cognitive Function:     6CIT Screen 01/19/2021  What Year? 0 points  What month? 0 points  What time? 0 points  Count back from 20 0 points  Months in reverse 0 points  Repeat phrase 0 points  Total Score 0    Immunizations  There is no immunization history on file for this patient.  TDAP status: Up to date  Patient believes she had this at the hospital, I have requested records  Flu Vaccine status: Up to date  Pneumococcal vaccine status: Up to date   Screening Tests Health Maintenance  Topic Date Due  . Hepatitis C Screening  Never done  . COVID-19 Vaccine (1) Never done  . TETANUS/TDAP  Never done  . PNA vac Low Risk Adult (1 of 2 - PCV13) Never done  . INFLUENZA VACCINE  04/25/2021  . MAMMOGRAM  01/11/2023  . COLONOSCOPY (Pts 45-37yrs Insurance coverage will need to be confirmed)  04/04/2023  . DEXA SCAN  Completed  . HPV VACCINES  Aged Out    Health Maintenance  Health Maintenance Due  Topic Date Due  . Hepatitis C  Screening  Never done  . COVID-19 Vaccine (1) Never done  . TETANUS/TDAP  Never done  . PNA vac Low Risk Adult (1 of 2 - PCV13) Never done    Colorectal cancer screening: Type of screening: Colonoscopy. Completed 03/2018. Repeat every 5 years  Mammogram status: Completed 12/2020. Repeat every year  Bone  Density status: Completed 12/2020. Results reflect: Bone density results: NORMAL. Repeat every 2 years.  Lung Cancer Screening: (Low Dose CT Chest recommended if Age 21-80 years, 30 pack-year currently smoking OR have quit w/in 15years.) does not qualify.   Additional Screening:  Vision Screening: Recommended annual ophthalmology exams for early detection of glaucoma and other disorders of the eye. Is the patient up to date with their annual eye exam?  Yes  Who is the provider or what is the name of the office in which the patient attends annual eye exams? [facility]  Dental Screening: Recommended annual dental exams for proper oral hygiene   Plan:     1- Get records for TDap and PNA Vaccines, patient believes that she has had them in the past 2- Follow-up on recommendations from Dr Otho Perl for sleep study 3- Exercise as tolerated, work on better eating habits (more fruits and vegetables to balance meals) 4- OSA - spoke with Dr Tobie Poet, OK to order sleep study per recommendation of Dr Otho Perl.  I spoke with the nurse at the cardiology office and she is OK with a hone sleep study.  Patient had CPAP in the past however did not tolerate it well.  She states that she has daytime sleepiness and naps during the day.  I have personally reviewed and noted the following in the patient's chart:   . Medical and social history . Use of alcohol, tobacco or illicit drugs  . Current medications and supplements . Functional ability and status . Nutritional status . Physical activity . Advanced directives . List of other physicians . Hospitalizations, surgeries, and ER visits in previous 12  months . Vitals . Screenings to include cognitive, depression, and falls . Referrals and appointments  In addition, I have reviewed and discussed with patient certain preventive protocols, quality metrics, and best practice recommendations. A written personalized care plan for preventive services as well as general preventive health recommendations were provided to patient.     Erie Noe, LPN   [id number]

## 2021-01-19 NOTE — Patient Instructions (Signed)
 Fall Prevention in the Home, Adult Falls can cause injuries and can happen to people of all ages. There are many things you can do to make your home safe and to help prevent falls. Ask for help when making these changes. What actions can I take to prevent falls? General Instructions  Use good lighting in all rooms. Replace any light bulbs that burn out.  Turn on the lights in dark areas. Use night-lights.  Keep items that you use often in easy-to-reach places. Lower the shelves around your home if needed.  Set up your furniture so you have a clear path. Avoid moving your furniture around.  Do not have throw rugs or other things on the floor that can make you trip.  Avoid walking on wet floors.  If any of your floors are uneven, fix them.  Add color or contrast paint or tape to clearly mark and help you see: ? Grab bars or handrails. ? First and last steps of staircases. ? Where the edge of each step is.  If you use a stepladder: ? Make sure that it is fully opened. Do not climb a closed stepladder. ? Make sure the sides of the stepladder are locked in place. ? Ask someone to hold the stepladder while you use it.  Know where your pets are when moving through your home. What can I do in the bathroom?  Keep the floor dry. Clean up any water on the floor right away.  Remove soap buildup in the tub or shower.  Use nonskid mats or decals on the floor of the tub or shower.  Attach bath mats securely with double-sided, nonslip rug tape.  If you need to sit down in the shower, use a plastic, nonslip stool.  Install grab bars by the toilet and in the tub and shower. Do not use towel bars as grab bars.      What can I do in the bedroom?  Make sure that you have a light by your bed that is easy to reach.  Do not use any sheets or blankets for your bed that hang to the floor.  Have a firm chair with side arms that you can use for support when you get dressed. What can I do  in the kitchen?  Clean up any spills right away.  If you need to reach something above you, use a step stool with a grab bar.  Keep electrical cords out of the way.  Do not use floor polish or wax that makes floors slippery. What can I do with my stairs?  Do not leave any items on the stairs.  Make sure that you have a light switch at the top and the bottom of the stairs.  Make sure that there are handrails on both sides of the stairs. Fix handrails that are broken or loose.  Install nonslip stair treads on all your stairs.  Avoid having throw rugs at the top or bottom of the stairs.  Choose a carpet that does not hide the edge of the steps on the stairs.  Check carpeting to make sure that it is firmly attached to the stairs. Fix carpet that is loose or worn. What can I do on the outside of my home?  Use bright outdoor lighting.  Fix the edges of walkways and driveways and fix any cracks.  Remove anything that might make you trip as you walk through a door, such as a raised step or threshold.  Trim   any bushes or trees on paths to your home.  Check to see if handrails are loose or broken and that both sides of all steps have handrails.  Install guardrails along the edges of any raised decks and porches.  Clear paths of anything that can make you trip, such as tools or rocks.  Have leaves, snow, or ice cleared regularly.  Use sand or salt on paths during winter.  Clean up any spills in your garage right away. This includes grease or oil spills. What other actions can I take?  Wear shoes that: ? Have a low heel. Do not wear high heels. ? Have rubber bottoms. ? Feel good on your feet and fit well. ? Are closed at the toe. Do not wear open-toe sandals.  Use tools that help you move around if needed. These include: ? Canes. ? Walkers. ? Scooters. ? Crutches.  Review your medicines with your doctor. Some medicines can make you feel dizzy. This can increase your  chance of falling. Ask your doctor what else you can do to help prevent falls. Where to find more information  Centers for Disease Control and Prevention, [employer]: www.cdc.gov  National Institute on Aging: www.nia.nih.gov Contact a doctor if:  You are afraid of falling at home.  You feel weak, drowsy, or dizzy at home.  You fall at home. Summary  There are many simple things that you can do to make your home safe and to help prevent falls.  Ways to make your home safe include removing things that can make you trip and installing grab bars in the bathroom.  Ask for help when making these changes in your home. This information is not intended to replace advice given to you by your health care provider. Make sure you discuss any questions you have with your health care provider. Document Revised: 04/14/2020 Document Reviewed: 04/14/2020 Elsevier Patient Education  2021 Elsevier Inc.   Health Maintenance, Female Adopting a healthy lifestyle and getting preventive care are important in promoting health and wellness. Ask your health care provider about:  The right schedule for you to have regular tests and exams.  Things you can do on your own to prevent diseases and keep yourself healthy. What should I know about diet, weight, and exercise? Eat a healthy diet  Eat a diet that includes plenty of vegetables, fruits, low-fat dairy products, and lean protein.  Do not eat a lot of foods that are high in solid fats, added sugars, or sodium.   Maintain a healthy weight Body mass index (BMI) is used to identify weight problems. It estimates body fat based on height and weight. Your health care provider can help determine your BMI and help you achieve or maintain a healthy weight. Get regular exercise Get regular exercise. This is one of the most important things you can do for your health. Most adults should:  Exercise for at least 150 minutes each week. The exercise should increase  your heart rate and make you sweat (moderate-intensity exercise).  Do strengthening exercises at least twice a week. This is in addition to the moderate-intensity exercise.  Spend less time sitting. Even light physical activity can be beneficial. Watch cholesterol and blood lipids Have your blood tested for lipids and cholesterol at 72 years of age, then have this test every 5 years. Have your cholesterol levels checked more often if:  Your lipid or cholesterol levels are high.  You are older than 72 years of age.  You are at   high risk for heart disease. What should I know about cancer screening? Depending on your health history and family history, you may need to have cancer screening at various ages. This may include screening for:  Breast cancer.  Cervical cancer.  Colorectal cancer.  Skin cancer.  Lung cancer. What should I know about heart disease, diabetes, and high blood pressure? Blood pressure and heart disease  High blood pressure causes heart disease and increases the risk of stroke. This is more likely to develop in people who have high blood pressure readings, are of African descent, or are overweight.  Have your blood pressure checked: ? Every 3-5 years if you are 18-39 years of age. ? Every year if you are 40 years old or older. Diabetes Have regular diabetes screenings. This checks your fasting blood sugar level. Have the screening done:  Once every three years after age 40 if you are at a normal weight and have a low risk for diabetes.  More often and at a younger age if you are overweight or have a high risk for diabetes. What should I know about preventing infection? Hepatitis B If you have a higher risk for hepatitis B, you should be screened for this virus. Talk with your health care provider to find out if you are at risk for hepatitis B infection. Hepatitis C Testing is recommended for:  Everyone born from 1945 through 1965.  Anyone with known  risk factors for hepatitis C. Sexually transmitted infections (STIs)  Get screened for STIs, including gonorrhea and chlamydia, if: ? You are sexually active and are younger than 72 years of age. ? You are older than 72 years of age and your health care provider tells you that you are at risk for this type of infection. ? Your sexual activity has changed since you were last screened, and you are at increased risk for chlamydia or gonorrhea. Ask your health care provider if you are at risk.  Ask your health care provider about whether you are at high risk for HIV. Your health care provider may recommend a prescription medicine to help prevent HIV infection. If you choose to take medicine to prevent HIV, you should first get tested for HIV. You should then be tested every 3 months for as long as you are taking the medicine. Pregnancy  If you are about to stop having your period (premenopausal) and you may become pregnant, seek counseling before you get pregnant.  Take 400 to 800 micrograms (mcg) of folic acid every day if you become pregnant.  Ask for birth control (contraception) if you want to prevent pregnancy. Osteoporosis and menopause Osteoporosis is a disease in which the bones lose minerals and strength with aging. This can result in bone fractures. If you are 65 years old or older, or if you are at risk for osteoporosis and fractures, ask your health care provider if you should:  Be screened for bone loss.  Take a calcium or vitamin D supplement to lower your risk of fractures.  Be given hormone replacement therapy (HRT) to treat symptoms of menopause. Follow these instructions at home: Lifestyle  Do not use any products that contain nicotine or tobacco, such as cigarettes, e-cigarettes, and chewing tobacco. If you need help quitting, ask your health care provider.  Do not use street drugs.  Do not share needles.  Ask your health care provider for help if you need support or  information about quitting drugs. Alcohol use  Do not drink alcohol   if: ? Your health care provider tells you not to drink. ? You are pregnant, may be pregnant, or are planning to become pregnant.  If you drink alcohol: ? Limit how much you use to 0-1 drink a day. ? Limit intake if you are breastfeeding.  Be aware of how much alcohol is in your drink. In the [place] one drink equals one 12 oz bottle of beer (355 mL), one 5 oz glass of wine (148 mL), or one 1 oz glass of hard liquor (44 mL). General instructions  Schedule regular health, dental, and eye exams.  Stay current with your vaccines.  Tell your health care provider if: ? You often feel depressed. ? You have ever been abused or do not feel safe at home. Summary  Adopting a healthy lifestyle and getting preventive care are important in promoting health and wellness.  Follow your health care provider's instructions about healthy diet, exercising, and getting tested or screened for diseases.  Follow your health care provider's instructions on monitoring your cholesterol and blood pressure. This information is not intended to replace advice given to you by your health care provider. Make sure you discuss any questions you have with your health care provider. Document Revised: 09/04/2018 Document Reviewed: 09/04/2018 Elsevier Patient Education  2021 Elsevier Inc.  

## 2021-01-20 ENCOUNTER — Ambulatory Visit
Admission: RE | Admit: 2021-01-20 | Discharge: 2021-01-20 | Disposition: A | Discharge status: Home / Self Care | Payer: PPO | Source: Ambulatory Visit | Attending: Family Medicine | Admitting: Family Medicine

## 2021-01-20 DIAGNOSIS — M5416 Radiculopathy, lumbar region: Secondary | ICD-10-CM

## 2021-01-20 DIAGNOSIS — M47817 Spondylosis without myelopathy or radiculopathy, lumbosacral region: Secondary | ICD-10-CM | POA: Diagnosis not present

## 2021-01-20 MED ORDER — IOPAMIDOL (ISOVUE-M 200) INJECTION 41%
1.0000 mL | Freq: Once | INTRAMUSCULAR | Status: AC
  Administered 2021-01-20: 1 mL via EPIDURAL

## 2021-01-20 MED ORDER — METHYLPREDNISOLONE ACETATE 40 MG/ML INJ SUSP (RADIOLOG
80.0000 mg | Freq: Once | INTRAMUSCULAR | Status: AC
  Administered 2021-01-20: 80 mg via EPIDURAL

## 2021-01-20 NOTE — Discharge Instructions (Signed)

## 2021-01-21 NOTE — Addendum Note (Signed)
Addended by: Erie Noe on: 01/21/2021 09:34 AM   Modules accepted: Orders

## 2021-01-27 ENCOUNTER — Other Ambulatory Visit: Payer: Self-pay | Admitting: Gastroenterology

## 2021-01-31 DIAGNOSIS — H40003 Preglaucoma, unspecified, bilateral: Secondary | ICD-10-CM | POA: Diagnosis not present

## 2021-01-31 DIAGNOSIS — H40053 Ocular hypertension, bilateral: Secondary | ICD-10-CM | POA: Diagnosis not present

## 2021-01-31 DIAGNOSIS — H2513 Age-related nuclear cataract, bilateral: Secondary | ICD-10-CM | POA: Diagnosis not present

## 2021-02-04 ENCOUNTER — Telehealth: Payer: Self-pay

## 2021-02-04 NOTE — Progress Notes (Signed)
Chronic Care Management Pharmacy Assistant   Name: Patricia Leonard  MRN: [id number] DOB: 1951/06/09   Reason for Encounter: Disease State for Lipids  Recent office visits:  01/19/21-Annual wellness exam, Current Exercise Habits: The patient does not participate in regular exercise at present, Exercise limited by: Other - see comments (back and hip pain)  01/04/21-Sports Med, epidural order,   01/03/21-Cardiology, Patricia Leonard had some short bursts of SVT captured on her monitor but these seem to have calmed down a lot since her visit here back in February and she is not really describing a whole on way of palpitations at this time. We discussed watching her clinically or adding a low-dose medicine but as she seems to be doing well I have recommended we watch her clinically. Certainly should she begin to experience more palpitations we could try a low-dose of beta-blocker or calcium channel blocker to see if this would suppress these episodes. Her blood pressure seems to be well controlled here in the office today. Her echocardiogram suggested aortic valve sclerosis without stenosis. We will consider repeat echocardiogram next year. She tells me Dr. Tobie Poet recently started some rosuvastatin and I hope she will tolerate that well. She tells me she thinks she will pursue the follow-up sleep study. She was diagnosed with sleep apnea years ago but was intolerant of the CPAP but this was many years ago. I would not make any changes in her medical regimen I will see her back in 6 months   12/28/20- Patricia Leonard CPP, Referral to [place] Patient has not started Crestor. She plans to talk to Dr. Otho Perl about it on Monday during appointment. Patient is working on healthy eating and being more active.  Patient calling to schedule epidural with Dr. Tamala Julian as advised.  Patient has occasional low belly cramping that she isn't sure of origin. If it continues and unrelieved by dicyclomine, she will  contact Dr. Helane Rima to be evaluated. Patient is scheduled for Dexa scan this month at [facility]  11/11/20-Labs, Blood count normal. Liver function normal. Kidney function normal ...  Recent consult visits:  11/22/20-Cardiology, She describes some easy fatigability and dyspnea since her Covid infection last year. I would like her to have an echocardiogram to assess her left ventricular systolic function with that history and her symptoms. Her blood pressure is apparent been running somewhat high recently. I urged her to watch her salt in her diet. Hopefully she will be able to begin some regular exercise which I think would impact the blood pressure. It does not seem high enough initially here to consider starting therapy. She has a lot of pain in her right hip as well. She should certainly follow through with the orthopedist to see if an etiology for this hip pain can be determined. She reports that she was previously diagnosed with sleep apnea but intolerant of CPAP therapy. This has been years ago. I urged her to follow-up with Dr. Tobie Poet and maybe see a sleep specialist again and reconsider this as new masks are available now she may be able to tolerate this better. I will plan on seeing her back after the echo and monitor.   Hospital visits:  None in previous 6 months  Medications: Outpatient Encounter Medications as of 02/04/2021  Medication Sig  . Biotin 10 MG CAPS Take by mouth.  . calcium citrate-vitamin D (CITRACAL+D) 315-200 MG-UNIT tablet Take 1 tablet by mouth 2 (two) times daily.  . calcium-vitamin D (OSCAL WITH D)  500-200 MG-UNIT TABS tablet Take by mouth.  . clotrimazole-betamethasone (LOTRISONE) cream Apply topically.  . cyanocobalamin 1000 MCG tablet Take by mouth.  . dicyclomine (BENTYL) 10 MG capsule Take 1 capsule (10 mg total) by mouth 3 (three) times daily before meals. As needed  . gabapentin (NEURONTIN) 100 MG capsule Take 2 capsules (200 mg total) by mouth at bedtime.   . magnesium hydroxide (MILK OF MAGNESIA) 400 MG/5ML suspension Take 15 mLs by mouth daily as needed for mild constipation.  Marland Kitchen omeprazole (PRILOSEC) 40 MG capsule TAKE 1 CAPSULE (40 MG TOTAL) BY MOUTH IN THE MORNING AND AT BEDTIME.  . predniSONE (DELTASONE) 20 MG tablet prednisone 20 mg tablet  TAKE TWO TABLETS BY MOUTH DAILY  . predniSONE (DELTASONE) 50 MG tablet Pt to take 50 mg of prednisone on 01/19/21 at 9:00 PM, 50 mg of prednisone on 01/20/21 at 3:00 AM, and 50 mg of prednisone on 01/20/21 at 9:00 AM. Pt is also to take 50 mg of benadryl on 01/20/21 at 9:00 AM. Please call [phone] with any questions.  . pyridoxine (B-6) 100 MG tablet Take by mouth.  . rosuvastatin (CRESTOR) 10 MG tablet TAKE 1 TABLET BY MOUTH EVERY DAY   No facility-administered encounter medications on file as of 02/04/2021.    02/04/2021 Name: Patricia Leonard MRN: [id number] DOB: 06-06-1951 Patricia Leonard is a 70 y.o. year old female who is a primary care patient of Cox, Kirsten, MD.  Comprehensive medication review performed; Spoke to patient regarding cholesterol  Lipid Panel    Component Value Date/Time   CHOL 209 (H) 11/11/2020 1038   TRIG 162 (H) 11/11/2020 1038   HDL 52 11/11/2020 1038   LDLCALC 128 (H) 11/11/2020 1038    10-year ASCVD risk score: The 10-year ASCVD risk score Mikey Bussing DC Brooke Bonito., et al., 2013) is: 17.2%   Values used to calculate the score:     Age: 70 years     Sex: Female     Is Non-Hispanic African American: No     Diabetic: No     Tobacco smoker: No     Systolic Blood Pressure: 106 mmHg     Is BP treated: No     HDL Cholesterol: 52 mg/dL     Total Cholesterol: 209 mg/dL  . Current antihyperlipidemic regimen:  Rosuvastatin  . Previous antihyperlipidemic medications tried: Red Yeast Rice  . ASCVD risk enhancing conditions: age >5   . What recent interventions/DTPs have been made by any provider to improve Cholesterol control since last CPP Visit: Patient sated she is still  having back an hip pain, she had an Epidural injection 2 weeks ago, she has some improvement in her back, but not her hip.  Due to the pain she still has she has not started on Crestor yet.  She said she is waiting to get some relief due to statins causing muscle pain.  She was supposed to have home sleep study, but insurance wont cover it, so she is working on getting that resolved.   . What diet changes have been made to improve Cholesterol?  Patient stated she has not been watching her diet due to not feeling well.  She said she hasn't even felt like going to grocery store, so she has just been eating what she has had in her pantry.  . What exercise is being done to improve Cholesterol?  She does what she can with her back and hip pain, but is limited for now.  She is  hoping to get some improvement with this soon.   Adherence Review: Does the patient have >5 day gap between last estimated fill dates? No  Star Medication None    Clarita Leber, [place] Pharmacist Assistant [phone]

## 2021-02-14 ENCOUNTER — Other Ambulatory Visit: Payer: Self-pay

## 2021-02-14 DIAGNOSIS — G4733 Obstructive sleep apnea (adult) (pediatric): Secondary | ICD-10-CM

## 2021-02-28 ENCOUNTER — Telehealth: Payer: Self-pay

## 2021-02-28 NOTE — Progress Notes (Signed)
Chronic Care Management Pharmacy Assistant   Name: Patricia Leonard  MRN: [id number] DOB: 20-Jul-1951  Reason for Encounter: Disease State for general adherence  Recent office visits:  02/14/21-obstructive sleep apnea test ordered  01/19/21-Annual wellness exam, Current Exercise Habits: The patient does not participate in regular exercise at present, Exercise limited by: Other - see comments (back and hip pain)  01/04/21-Sports Med, epidural order,   01/03/21-Cardiology, Patricia Leonard had some short bursts of SVT captured on her monitor but these seem to have calmed down a lot since her visit here back in February and she is not really describing a whole on way of palpitations at this time. We discussed watching her clinically or adding a low-dose medicine but as she seems to be doing well I have recommended we watch her clinically. Certainly should she begin to experience more palpitations we could try a low-dose of beta-blocker or calcium channel blocker to see if this would suppress these episodes. Her blood pressure seems to be well controlled here in the office today. Her echocardiogram suggested aortic valve sclerosis without stenosis. We will consider repeat echocardiogram next year. She tells me Patricia Leonard recently started some rosuvastatin and I hope she will tolerate that well. She tells me she thinks she will pursue the follow-up sleep study. She was diagnosed with sleep apnea years ago but was intolerant of the CPAP but this was many years ago. I would not make any changes in her medical regimen I will see her back in 6 months  12/28/20- Patricia Leonard CPP, Referral to [place] Patient has not started Crestor. She plans to talk to Patricia Leonard about it on Monday during appointment. Patient is working on healthy eating and being more active.  Patient calling to schedule epidural with Patricia Leonard as advised.  Patient has occasional low belly cramping that she isn't sure of  origin. If it continues and unrelieved by dicyclomine, she will contact Patricia Leonard to be evaluated. Patient is scheduled for Dexa scan this month at Patricia Leonard  11/11/20-Labs, Blood count normal. Liver function normal. Kidney function normal ...   Recent consult visits:  11/22/20-Cardiology, She describes some easy fatigability and dyspnea since her Covid infection last year. I would like her to have an echocardiogram to assess her left ventricular systolic function with that history and her symptoms. Her blood pressure is apparent been running somewhat high recently. I urged her to watch her salt in her diet. Hopefully she will be able to begin some regular exercise which I think would impact the blood pressure. It does not seem high enough initially here to consider starting therapy. She has a lot of pain in her right hip as well. She should certainly follow through with the orthopedist to see if an etiology for this hip pain can be determined. She reports that she was previously diagnosed with sleep apnea but intolerant of CPAP therapy. This has been years ago. I urged her to follow-up with Patricia Leonard and maybe see a sleep specialist again and reconsider this as new masks are available now she may be able to tolerate this better. I will plan on seeing her back after the echo and monitor.  Leonard visits:  None in previous 6 months  Medications: Outpatient Encounter Medications as of 02/28/2021  Medication Sig  . Biotin 10 MG CAPS Take by mouth.  . calcium citrate-vitamin D (CITRACAL+D) 315-200 MG-UNIT tablet Take 1 tablet by mouth 2 (two) times daily.  Marland Kitchen  calcium-vitamin D (OSCAL WITH D) 500-200 MG-UNIT TABS tablet Take by mouth.  . clotrimazole-betamethasone (LOTRISONE) cream Apply topically.  . cyanocobalamin 1000 MCG tablet Take by mouth.  . dicyclomine (BENTYL) 10 MG capsule Take 1 capsule (10 mg total) by mouth 3 (three) times daily before meals. As needed  . gabapentin (NEURONTIN)  100 MG capsule Take 2 capsules (200 mg total) by mouth at bedtime.  . magnesium hydroxide (MILK OF MAGNESIA) 400 MG/5ML suspension Take 15 mLs by mouth daily as needed for mild constipation.  Marland Kitchen omeprazole (PRILOSEC) 40 MG capsule TAKE 1 CAPSULE (40 MG TOTAL) BY MOUTH IN THE MORNING AND AT BEDTIME.  . predniSONE (DELTASONE) 20 MG tablet prednisone 20 mg tablet  TAKE TWO TABLETS BY MOUTH DAILY  . predniSONE (DELTASONE) 50 MG tablet Pt to take 50 mg of prednisone on 01/19/21 at 9:00 PM, 50 mg of prednisone on 01/20/21 at 3:00 AM, and 50 mg of prednisone on 01/20/21 at 9:00 AM. Pt is also to take 50 mg of benadryl on 01/20/21 at 9:00 AM. Please call [phone] with any questions.  . pyridoxine (B-6) 100 MG tablet Take by mouth.  . rosuvastatin (CRESTOR) 10 MG tablet TAKE 1 TABLET BY MOUTH EVERY DAY   No facility-administered encounter medications on file as of 02/28/2021.   Spoke to Patient  Patient stated she has not started the Rosuvastatin as of yet, she has been having some bad headaches, she said this started after she had Covid.  She said she is going to try and start the medication next week.  She has been very reluctant due to family history of liver cancer.  She said she didn't want more problems than what she already has.  She said she was going to call the office to see how soon after starting she would need to have blood work done.   Patient stated she had a recent MRI and showed some degenerative changes and disc issues.  She had an epidural injection about a month ago, it helped a little, but patient still has stiffness after sitting to long and some pain.      Patient has not had her sleep study, she was having some insurance issues getting the home study approved, but has not heard any additional information.  I told her I would notify Patricia Leonard, CPP to touch base with referral department.   Patient has been taking her other medications as directed and denies any issues with them.  She  stays very active and has been getting outside more.   Star Rating Drugs: None listed  Gaps Mammogram last done: 01/10/21 Colonoscopy last done:  04/03/18 Patient is not diabetic AWV: last done 01/19/21  Clarita Leber, [place] Pharmacist Assistant [phone]

## 2021-03-03 DIAGNOSIS — L910 Hypertrophic scar: Secondary | ICD-10-CM | POA: Diagnosis not present

## 2021-03-30 DIAGNOSIS — H40053 Ocular hypertension, bilateral: Secondary | ICD-10-CM | POA: Diagnosis not present

## 2021-04-13 NOTE — Progress Notes (Signed)
[place] [address] [place] Phone: [phone] Subjective:   I Patricia Leonard am serving as a Education administrator for Dr. Hulan Saas. This visit occurred during the SARS-CoV-2 public health emergency.  Safety protocols were in place, including screening questions prior to the visit, additional usage of staff PPE, and extensive cleaning of exam room while observing appropriate contact time as indicated for disinfecting solutions.   I'm seeing this patient by the request  of:  Cox, Elnita Maxwell, MD  CC: Back pain and neck pain follow-up  [id number]  09/23/2020 Severe arthritic changes as well.  We will have physical therapy work.  Gabapentin continued.  Continue with posture and ergonomics.  Worsening symptoms advanced imaging would be warranted but I hope patient will continue to respond to conservative therapy  Patient has responded fairly well to the home exercises as well as the gabapentin.  Discussed mobility feel physical therapy would be beneficial and will be referred today as well.  Discussed icing regimen and home exercises.  We will continue all the other conservative therapy follow-up with me again 2 months  Update 04/14/2021 Patricia Leonard is a 70 y.o. female coming in with complaint of back and neck pain. Patient had epidural on 01/20/2021. Patient states she felt better for about 1-2 weeks. States she is back where she was or worse right now. Wonders if she will have to do the epidurals all the time.  Patient still states that this pain is affecting daily activities on a regular basis.  Patient is of the MR lumbar spine was independently visualized by me today.  Patient had multiple levels of degenerative changes noted.  Patient did have spinal stenosis that appeared more at the L4-L5 level and progressive facet arthropathy.  Patient did undergo a L4-L5 epidural on January 20, 2021.  Patient states that it did help for approximately 2 weeks.     Past Medical History:  Diagnosis Date   Allergy    Anemia    Anxiety    Aortic atherosclerosis (HCC)    Arthritis    Cataract    Depression    Family history of colon cancer    Fibromyalgia    GERD (gastroesophageal reflux disease)    Hyperlipidemia    IBS (irritable bowel syndrome)    Rectal prolapse    Stomach ulcer    Tick bite    took antibiotics memorial day weekend 2021 on back   Past Surgical History:  Procedure Laterality Date   APPENDECTOMY     CESAREAN SECTION     CHOLECYSTECTOMY     COLONOSCOPY  04/18/2014   Diverticulosis.    ESOPHAGOGASTRODUODENOSCOPY  04/14/2016   Schatzkis ring. Hiatal hernia. Gastritis.    LIPOMA EXCISION     thermal ablation     TONSILLECTOMY     Social History   Socioeconomic History   Marital status: Widowed    Spouse name: Not on file   Number of children: Not on file   Years of education: Not on file   Highest education level: Not on file  Occupational History   Not on file  Tobacco Use   Smoking status: Never   Smokeless tobacco: Never  Vaping Use   Vaping Use: Never used  Substance and Sexual Activity   Alcohol use: Never   Drug use: Never   Sexual activity: Not on file  Other Topics Concern   Not on file  Social History Narrative   Not on  file   Social Determinants of Health   Financial Resource Strain: Not on file  Food Insecurity: No Food Insecurity   Worried About Charity fundraiser in the Last Year: Never true   Ran Out of Food in the Last Year: Never true  Transportation Needs: No Transportation Needs   Lack of Transportation (Medical): No   Lack of Transportation (Non-Medical): No  Physical Activity: Not on file  Stress: Not on file  Social Connections: Not on file   Allergies  Allergen Reactions   Amoxicillin-Pot Clavulanate     Stomach pain    Iohexol      Desc: HAD FACIAL RASH WITH CT CONTRAST IN 2006    Family History  Problem Relation Age of Onset   Colon cancer Mother    Liver  cancer Father    CAD Father    Hypertension Sister    Colon cancer Sister    Hyperlipidemia Brother    Gout Brother    Diabetes Maternal Grandmother    Diabetes Paternal Grandmother    Heart attack Other    Cancer Other        Leiomyosarcome and Liver   Migraines Other    Cancer Maternal Aunt        "female cancer, not breast"   Cancer Paternal Aunt        "female cancer, not breast"   Colon polyps Neg Hx    Esophageal cancer Neg Hx    Pancreatic cancer Neg Hx    Rectal cancer Neg Hx    Stomach cancer Neg Hx     Current Outpatient Medications (Endocrine & Metabolic):    predniSONE (DELTASONE) 20 MG tablet, prednisone 20 mg tablet  TAKE TWO TABLETS BY MOUTH DAILY   predniSONE (DELTASONE) 50 MG tablet, Pt to take 50 mg of prednisone on 01/19/21 at 9:00 PM, 50 mg of prednisone on 01/20/21 at 3:00 AM, and 50 mg of prednisone on 01/20/21 at 9:00 AM. Pt is also to take 50 mg of benadryl on 01/20/21 at 9:00 AM. Please call [phone] with any questions.  Current Outpatient Medications (Cardiovascular):    rosuvastatin (CRESTOR) 10 MG tablet, TAKE 1 TABLET BY MOUTH EVERY DAY    Current Outpatient Medications (Hematological):    cyanocobalamin 1000 MCG tablet, Take by mouth.  Current Outpatient Medications (Other):    Biotin 10 MG CAPS, Take by mouth.   calcium citrate-vitamin D (CITRACAL+D) 315-200 MG-UNIT tablet, Take 1 tablet by mouth 2 (two) times daily.   calcium-vitamin D (OSCAL WITH D) 500-200 MG-UNIT TABS tablet, Take by mouth.   clotrimazole-betamethasone (LOTRISONE) cream, Apply topically.   dicyclomine (BENTYL) 10 MG capsule, Take 1 capsule (10 mg total) by mouth 3 (three) times daily before meals. As needed   gabapentin (NEURONTIN) 100 MG capsule, Take 2 capsules (200 mg total) by mouth at bedtime.   magnesium hydroxide (MILK OF MAGNESIA) 400 MG/5ML suspension, Take 15 mLs by mouth daily as needed for mild constipation.   omeprazole (PRILOSEC) 40 MG capsule, TAKE 1 CAPSULE  (40 MG TOTAL) BY MOUTH IN THE MORNING AND AT BEDTIME.   pyridoxine (B-6) 100 MG tablet, Take by mouth.   Reviewed prior external information including notes and imaging from  primary care provider As well as notes that were available from care everywhere and other healthcare systems.  Past medical history, social, surgical and family history all reviewed in electronic medical record.  No pertanent information unless stated regarding to the chief complaint.   Review  of Systems:  No headache, visual changes, nausea, vomiting, diarrhea, constipation, dizziness, abdominal pain, skin rash, fevers, chills, night sweats, weight loss, swollen lymph nodes,  joint swelling, chest pain, shortness of breath, mood changes. POSITIVE muscle aches, body aches  Objective  There were no vitals taken for this visit.   General: No apparent distress alert and oriented x3 mood and affect normal, dressed appropriately.  HEENT: Pupils equal, extraocular movements intact  Respiratory: Patient's speak in full sentences and does not appear short of breath  Cardiovascular: No lower extremity edema, non tender, no erythema  Low back exam shows loss of lordosis.  Patient has pain with anything greater than 5 degrees of extension.  Patient noted good strength of the lower extremities today.  Patient does appear uncomfortable in the chair.    Impression and Recommendations:     The above documentation has been reviewed and is accurate and complete Lyndal Pulley, DO

## 2021-04-14 ENCOUNTER — Other Ambulatory Visit: Payer: Self-pay

## 2021-04-14 ENCOUNTER — Encounter: Payer: Self-pay | Admitting: Family Medicine

## 2021-04-14 ENCOUNTER — Ambulatory Visit (INDEPENDENT_AMBULATORY_CARE_PROVIDER_SITE_OTHER): Payer: PPO | Admitting: Family Medicine

## 2021-04-14 ENCOUNTER — Telehealth: Payer: Self-pay | Admitting: Family Medicine

## 2021-04-14 VITALS — BP 140/84 | HR 67 | Ht 66.0 in | Wt 228.0 lb

## 2021-04-14 DIAGNOSIS — G8929 Other chronic pain: Secondary | ICD-10-CM | POA: Diagnosis not present

## 2021-04-14 DIAGNOSIS — M545 Low back pain, unspecified: Secondary | ICD-10-CM | POA: Diagnosis not present

## 2021-04-14 DIAGNOSIS — M5416 Radiculopathy, lumbar region: Secondary | ICD-10-CM | POA: Diagnosis not present

## 2021-04-14 MED ORDER — CYCLOBENZAPRINE HCL 5 MG PO TABS: 5.0000 mg | ORAL_TABLET | Freq: Three times a day (TID) | ORAL | 3 refills | Status: DC | PRN

## 2021-04-14 NOTE — Telephone Encounter (Signed)
Patient called to let Dr Tamala Julian know that she had been taking Flexeril at night and it really seemed to help. She was taking some of her husbands so she will need her own prescription if Dr Tamala Julian would be able to send it in.  Please advise.

## 2021-04-14 NOTE — Telephone Encounter (Signed)
Done. Patient notified via mychart.

## 2021-04-14 NOTE — Assessment & Plan Note (Signed)
Discussed with patient at great length.  Patient continues to have some radicular symptoms.  Patient wants to try more natural approach and we discussed potential iron supplementations but warned of potential side effects.  Patient did respond well to the epidural previously and I encouraged her to consider this again.  We will also put in referral for aquatic therapy that I think could be beneficial.  Depending on how patient does with this we will discuss continuing the conservative therapy or referral to neurosurgery.  Patient agree with plan.  Total time reviewing this as well as patient's imaging from previous notes 33 minutes

## 2021-04-14 NOTE — Patient Instructions (Addendum)
Good to see you Epidural [phone] [facility] Iron 32-60 mg daily 500 mg  vitamin C [facility] [place] See me again in 6-8 weeks if worsening we can talk to neurosurgery

## 2021-04-27 ENCOUNTER — Ambulatory Visit: Payer: PPO | Admitting: Family Medicine

## 2021-04-27 DIAGNOSIS — Z6836 Body mass index (BMI) 36.0-36.9, adult: Secondary | ICD-10-CM | POA: Diagnosis not present

## 2021-04-27 DIAGNOSIS — R252 Cramp and spasm: Secondary | ICD-10-CM | POA: Diagnosis not present

## 2021-04-27 DIAGNOSIS — Z124 Encounter for screening for malignant neoplasm of cervix: Secondary | ICD-10-CM | POA: Diagnosis not present

## 2021-04-27 DIAGNOSIS — R895 Abnormal microbiological findings in specimens from other organs, systems and tissues: Secondary | ICD-10-CM | POA: Diagnosis not present

## 2021-04-28 ENCOUNTER — Other Ambulatory Visit: Payer: Self-pay

## 2021-04-28 DIAGNOSIS — D1801 Hemangioma of skin and subcutaneous tissue: Secondary | ICD-10-CM | POA: Diagnosis not present

## 2021-04-28 DIAGNOSIS — L820 Inflamed seborrheic keratosis: Secondary | ICD-10-CM | POA: Diagnosis not present

## 2021-04-28 DIAGNOSIS — D225 Melanocytic nevi of trunk: Secondary | ICD-10-CM | POA: Diagnosis not present

## 2021-04-28 DIAGNOSIS — L821 Other seborrheic keratosis: Secondary | ICD-10-CM | POA: Diagnosis not present

## 2021-04-28 DIAGNOSIS — D2239 Melanocytic nevi of other parts of face: Secondary | ICD-10-CM | POA: Diagnosis not present

## 2021-04-28 DIAGNOSIS — D485 Neoplasm of uncertain behavior of skin: Secondary | ICD-10-CM | POA: Diagnosis not present

## 2021-04-28 MED ORDER — PREDNISONE 50 MG PO TABS: ORAL_TABLET | ORAL | 0 refills | Status: DC

## 2021-04-28 MED ORDER — DIPHENHYDRAMINE HCL 50 MG PO TABS: 50.0000 mg | ORAL_TABLET | Freq: Once | ORAL | 0 refills | Status: DC

## 2021-04-29 ENCOUNTER — Telehealth: Payer: Self-pay

## 2021-04-29 NOTE — Telephone Encounter (Signed)
Patient voiced concerns over taking a 13-hour prep for her upcoming LESI since she states she felt awful with her last prep.  Also, her only reaction was a rash on her face (per epic) and/or three hives on her face (per pt).  After consulting with Dr. Jeralyn Ruths who will be the physician performing her LESI, patient was notified to skip the 13-hour prep and just to take Benadryl '50mg'$  by mouth one hour before her appt time.  She stated an understanding of these instructions and was grateful for our assistance.

## 2021-05-02 ENCOUNTER — Ambulatory Visit
Admission: RE | Admit: 2021-05-02 | Discharge: 2021-05-02 | Disposition: A | Discharge status: Home / Self Care | Payer: PPO | Source: Ambulatory Visit | Attending: Family Medicine | Admitting: Family Medicine

## 2021-05-02 ENCOUNTER — Other Ambulatory Visit: Payer: Self-pay

## 2021-05-02 DIAGNOSIS — G8929 Other chronic pain: Secondary | ICD-10-CM

## 2021-05-02 DIAGNOSIS — M47817 Spondylosis without myelopathy or radiculopathy, lumbosacral region: Secondary | ICD-10-CM | POA: Diagnosis not present

## 2021-05-02 DIAGNOSIS — M545 Low back pain, unspecified: Secondary | ICD-10-CM

## 2021-05-02 MED ORDER — METHYLPREDNISOLONE ACETATE 40 MG/ML INJ SUSP (RADIOLOG
80.0000 mg | Freq: Once | INTRAMUSCULAR | Status: AC
  Administered 2021-05-02: 80 mg via EPIDURAL

## 2021-05-02 MED ORDER — IOPAMIDOL (ISOVUE-M 200) INJECTION 41%
1.0000 mL | Freq: Once | INTRAMUSCULAR | Status: AC
  Administered 2021-05-02: 1 mL via EPIDURAL

## 2021-05-02 NOTE — Discharge Instructions (Signed)

## 2021-05-25 NOTE — Progress Notes (Signed)
[place] Phone: [phone] Subjective:   [name], am serving as a scribe for Dr. Hulan Saas.  This visit occurred during the SARS-CoV-2 public health emergency.  Safety protocols were in place, including screening questions prior to the visit, additional usage of staff PPE, and extensive cleaning of exam room while observing appropriate contact time as indicated for disinfecting solutions.   I'm seeing this patient by the request  of:  Cox, Elnita Maxwell, MD  CC: Low back pain follow-up  [id number]  04/14/2021 Discussed with patient at great length.  Patient continues to have some radicular symptoms.  Patient wants to try more natural approach and we discussed potential iron supplementations but warned of potential side effects.  Patient did respond well to the epidural previously and I encouraged her to consider this again.  We will also put in referral for aquatic therapy that I think could be beneficial.  Depending on how patient does with this we will discuss continuing the conservative therapy or referral to neurosurgery.  Patient agree with plan.  Total time reviewing this as well as patient's imaging from previous notes 33 minutes  Update 05/26/2021 Patricia Leonard is a 70 y.o. female coming in with complaint of LBP. Epidural on 05/02/2021. Was to begin iron supplement. Patient states that the epidural did not help. Patient feels like pain is worse. Unable to stand more than 10-15 minutes at a time. Patient did not start iron and she had labwork from [place] and found that iron level was ok.    Patient's MRI from March 2022 was independently visualized by me patient does have multilevel degenerative changes of the lumbar spine with significant progression.  Mild to moderate spinal stenosis noted at L4-L5 and L5-S1.  This is consistent with some of patient's symptoms and the radicular symptoms down the right leg     Past Medical History:  Diagnosis Date   Allergy    Anemia    Anxiety    Aortic atherosclerosis ([place])    Arthritis    Cataract    Depression    Family history of colon cancer    Fibromyalgia    GERD (gastroesophageal reflux disease)    Hyperlipidemia    IBS (irritable bowel syndrome)    Rectal prolapse    Stomach ulcer    Tick bite    took antibiotics memorial day weekend 2021 on back   Past Surgical History:  Procedure Laterality Date   APPENDECTOMY     CESAREAN SECTION     CHOLECYSTECTOMY     COLONOSCOPY  04/18/2014   Diverticulosis.    ESOPHAGOGASTRODUODENOSCOPY  04/14/2016   Schatzkis ring. Hiatal hernia. Gastritis.    LIPOMA EXCISION     thermal ablation     TONSILLECTOMY     Social History   Socioeconomic History   Marital status: Widowed    Spouse name: Not on file   Number of children: Not on file   Years of education: Not on file   Highest education level: Not on file  Occupational History   Not on file  Tobacco Use   Smoking status: Never   Smokeless tobacco: Never  Vaping Use   Vaping Use: Never used  Substance and Sexual Activity   Alcohol use: Never   Drug use: Never   Sexual activity: Not on file  Other Topics Concern   Not on file  Social History Narrative   Not on file  Social Determinants of Health   Financial Resource Strain: Not on file  Food Insecurity: No Food Insecurity   Worried About Charity fundraiser in the Last Year: Never true   Ran Out of Food in the Last Year: Never true  Transportation Needs: No Transportation Needs   Lack of Transportation (Medical): No   Lack of Transportation (Non-Medical): No  Physical Activity: Not on file  Stress: Not on file  Social Connections: Not on file   Allergies  Allergen Reactions   Amoxicillin-Pot Clavulanate Nausea Only    Stomach pain    Iohexol Rash    HAD FACIAL RASH WITH CT CONTRAST IN 2006    Family History  Problem Relation Age of Onset   Colon cancer Mother     Liver cancer Father    CAD Father    Hypertension Sister    Colon cancer Sister    Hyperlipidemia Brother    Gout Brother    Diabetes Maternal Grandmother    Diabetes Paternal Grandmother    Heart attack Other    Cancer Other        Leiomyosarcome and Liver   Migraines Other    Cancer Maternal Aunt        "female cancer, not breast"   Cancer Paternal Aunt        "female cancer, not breast"   Colon polyps Neg Hx    Esophageal cancer Neg Hx    Pancreatic cancer Neg Hx    Rectal cancer Neg Hx    Stomach cancer Neg Hx     Current Outpatient Medications (Endocrine & Metabolic):    predniSONE (DELTASONE) 20 MG tablet, prednisone 20 mg tablet  TAKE TWO TABLETS BY MOUTH DAILY   predniSONE (DELTASONE) 20 MG tablet, Take 1 tablet (20 mg total) by mouth daily with breakfast.   predniSONE (DELTASONE) 50 MG tablet, Pt to take 50 mg of prednisone on 01/19/21 at 9:00 PM, 50 mg of prednisone on 01/20/21 at 3:00 AM, and 50 mg of prednisone on 01/20/21 at 9:00 AM. Pt is also to take 50 mg of benadryl on 01/20/21 at 9:00 AM. Please call [phone] with any questions.   predniSONE (DELTASONE) 50 MG tablet, Pt to take Prednisone '50mg'$  by mouth on Monday, May 02, 2021, at 12:30am, 6:30am and 12:30pm.  She also is to take Benadryl '50mg'$  at 12:30pm this day with her last dose of Prednisone.  Please call [phone] with any questions.  Current Outpatient Medications (Cardiovascular):    rosuvastatin (CRESTOR) 10 MG tablet, TAKE 1 TABLET BY MOUTH EVERY DAY  Current Outpatient Medications (Respiratory):    diphenhydrAMINE (BENADRYL) 50 MG tablet, Take 1 tablet (50 mg total) by mouth once for 1 dose.   Current Outpatient Medications (Hematological):    cyanocobalamin 1000 MCG tablet, Take by mouth.  Current Outpatient Medications (Other):    Biotin 10 MG CAPS, Take by mouth.   calcium citrate-vitamin D (CITRACAL+D) 315-200 MG-UNIT tablet, Take 1 tablet by mouth 2 (two) times daily.   calcium-vitamin D  (OSCAL WITH D) 500-200 MG-UNIT TABS tablet, Take by mouth.   clotrimazole-betamethasone (LOTRISONE) cream, Apply topically.   cyclobenzaprine (FLEXERIL) 5 MG tablet, Take 1 tablet (5 mg total) by mouth 3 (three) times daily as needed for muscle spasms.   dicyclomine (BENTYL) 10 MG capsule, Take 1 capsule (10 mg total) by mouth 3 (three) times daily before meals. As needed   gabapentin (NEURONTIN) 100 MG capsule, Take 2 capsules (200 mg total) by mouth at  bedtime.   magnesium hydroxide (MILK OF MAGNESIA) 400 MG/5ML suspension, Take 15 mLs by mouth daily as needed for mild constipation.   omeprazole (PRILOSEC) 40 MG capsule, TAKE 1 CAPSULE (40 MG TOTAL) BY MOUTH IN THE MORNING AND AT BEDTIME.   pyridoxine (B-6) 100 MG tablet, Take by mouth.   Reviewed prior external information including notes and imaging from  primary care provider As well as notes that were available from care everywhere and other healthcare systems.  Past medical history, social, surgical and family history all reviewed in electronic medical record.  No pertanent information unless stated regarding to the chief complaint.   Review of Systems:  No headache, visual changes, nausea, vomiting, diarrhea, constipation, dizziness, abdominal pain, skin rash, fevers, chills, night sweats, weight loss, swollen lymph nodes, body aches, joint swelling, chest pain, shortness of breath, mood changes. POSITIVE muscle aches  Objective  Blood pressure 122/82, pulse 78, height '5\' 6"'$  (1.676 m), weight 229 lb (103.9 kg), SpO2 98 %.   General: No apparent distress alert and oriented x3 mood and affect normal, dressed appropriately.  Patient is tearful HEENT: Pupils equal, extraocular movements intact  Respiratory: Patient's speak in full sentences and does not appear short of breath  Cardiovascular: No lower extremity edema, non tender, no erythema  Gait antalgic Worsening pain of the back with anything more of extension than 10 degrees.   Patient can stand fairly comfortably.  No atrophy of the lower extremities.  4-5 strength of the lower extremities.  Tightness of the hamstrings bilaterally right more than left.  Neurovascularly intact.   Impression and Recommendations:     The above documentation has been reviewed and is accurate and complete Lyndal Pulley, DO

## 2021-05-26 ENCOUNTER — Other Ambulatory Visit: Payer: Self-pay

## 2021-05-26 ENCOUNTER — Ambulatory Visit (INDEPENDENT_AMBULATORY_CARE_PROVIDER_SITE_OTHER): Payer: PPO | Admitting: Family Medicine

## 2021-05-26 ENCOUNTER — Encounter: Payer: Self-pay | Admitting: Family Medicine

## 2021-05-26 VITALS — BP 122/82 | HR 78 | Ht 66.0 in | Wt 229.0 lb

## 2021-05-26 DIAGNOSIS — M5416 Radiculopathy, lumbar region: Secondary | ICD-10-CM

## 2021-05-26 MED ORDER — GABAPENTIN 100 MG PO CAPS: 200.0000 mg | ORAL_CAPSULE | Freq: Every day | ORAL | 0 refills | Status: DC

## 2021-05-26 MED ORDER — PREDNISONE 20 MG PO TABS: 20.0000 mg | ORAL_TABLET | Freq: Every day | ORAL | 0 refills | Status: DC

## 2021-05-26 NOTE — Assessment & Plan Note (Signed)
Discussed with patient at great length.  Not responding to the second epidural.  He did not initially respond to the first 1.  Patient does have facet arthropathy as well that could be contributing to some of the discomfort but not giving her the radicular symptoms likely.  We discussed the potential for facet injections but would like to refer patient to neurosurgery to discuss other surgical options.  See if patient is a candidate at all.  Discussed with patient not knowing exactly where the true nerve impingement is at the moment may need further work-up first.  Patient is in agreement with the plan.  Encouraged her to try to do the gabapentin more regularly.  Total time with patient reviewing imaging as well as discussing with patient will 2 to 3 months otherwise

## 2021-05-26 NOTE — Patient Instructions (Signed)
Refilled gabapentin'200mg'$  at night Prednisone '20mg'$  daily for 7 dais Do not use NSAIDS such as Advil or Aleve when taking prednisone It is ok to use Tylenol for additional pain relief Send message in 3 weeks if you want to do facet injections Referral to Dr. Arnoldo Morale to discuss other options Follow up in 2 monthys

## 2021-06-16 DIAGNOSIS — D485 Neoplasm of uncertain behavior of skin: Secondary | ICD-10-CM | POA: Diagnosis not present

## 2021-06-17 DIAGNOSIS — R030 Elevated blood-pressure reading, without diagnosis of hypertension: Secondary | ICD-10-CM | POA: Diagnosis not present

## 2021-06-17 DIAGNOSIS — G8929 Other chronic pain: Secondary | ICD-10-CM | POA: Diagnosis not present

## 2021-06-17 DIAGNOSIS — M5441 Lumbago with sciatica, right side: Secondary | ICD-10-CM | POA: Diagnosis not present

## 2021-06-17 DIAGNOSIS — M5442 Lumbago with sciatica, left side: Secondary | ICD-10-CM | POA: Diagnosis not present

## 2021-06-27 ENCOUNTER — Encounter: Payer: Self-pay | Admitting: Nurse Practitioner

## 2021-06-27 ENCOUNTER — Ambulatory Visit (INDEPENDENT_AMBULATORY_CARE_PROVIDER_SITE_OTHER): Payer: PPO | Admitting: Nurse Practitioner

## 2021-06-27 ENCOUNTER — Other Ambulatory Visit: Payer: Self-pay

## 2021-06-27 VITALS — BP 140/100 | HR 76 | Temp 95.4°F | Ht 66.5 in | Wt 231.0 lb

## 2021-06-27 DIAGNOSIS — N3000 Acute cystitis without hematuria: Secondary | ICD-10-CM

## 2021-06-27 DIAGNOSIS — B372 Candidiasis of skin and nail: Secondary | ICD-10-CM | POA: Diagnosis not present

## 2021-06-27 DIAGNOSIS — H60541 Acute eczematoid otitis externa, right ear: Secondary | ICD-10-CM

## 2021-06-27 LAB — POCT URINALYSIS DIPSTICK
Bilirubin, UA: NEGATIVE
Blood, UA: NEGATIVE
Glucose, UA: NEGATIVE
Ketones, UA: NEGATIVE
Nitrite, UA: NEGATIVE
Protein, UA: NEGATIVE
Spec Grav, UA: 1.015 (ref 1.010–1.025)
Urobilinogen, UA: 1 E.U./dL
pH, UA: 7.5 (ref 5.0–8.0)

## 2021-06-27 MED ORDER — NITROFURANTOIN MONOHYD MACRO 100 MG PO CAPS: 100.0000 mg | ORAL_CAPSULE | Freq: Two times a day (BID) | ORAL | 0 refills | Status: AC

## 2021-06-27 MED ORDER — FLUCONAZOLE 150 MG PO TABS: 150.0000 mg | ORAL_TABLET | Freq: Once | ORAL | 0 refills | Status: AC

## 2021-06-27 MED ORDER — KETOCONAZOLE 2 % EX CREA: TOPICAL_CREAM | CUTANEOUS | 0 refills | Status: DC

## 2021-06-27 MED ORDER — TRIAMCINOLONE ACETONIDE 0.1 % EX CREA
TOPICAL_CREAM | CUTANEOUS | 0 refills | Status: DC
Start: 2021-06-27 — End: 2022-02-16

## 2021-06-27 NOTE — Progress Notes (Signed)
Acute Office Visit  Subjective:    Patient ID: Patricia Leonard, female    DOB: June 05, 1951, 70 y.o.   MRN: [id number]  Chief Complaint  Patient presents with   Urinary Tract Infection    HPI Patient is in today for dysuria,frequency, and hesitancy. Denies fever, flank pain, chills, or hematuria. Onset of symptoms was 4-days ago. States she took an OTC remedy for symptoms but cannot recall the name. States she has a  rash to right ear and under lower abdominal fold.     Urinary symptoms  She reports new onset dysuria The current episode started a few days ago and is staying constant. Patient states symptoms are 5/10 in intensity, occurring intermittently. She  has not been recently treated for similar symptoms.    Associated symptoms: No abdominal pain No back pain  No chills No constipation  No cramping No diarrhea  No discharge No fever  No hematuria No nausea  No vomiting     Rash Deztiny states she developed a pruritic rash to right ear, left upper arm, and right thigh. Onset was a few days ago. Treatment has included topical aloe vera and hydrocortisone cream with minimal relief. States she recently colored her hair, was exposed to some harsh cleaner that may have dripped on her ear, and had an epidural injection with dye that could be sources of rash. She was prescribed a course of Prednisone prior to epidural injection due to dye sensitivity. She denies recent known tick or insect bite. She is reluctant to take IM or oral steroids at  this time due to possible side effects.     Past Medical History:  Diagnosis Date   Allergy    Anemia    Anxiety    Aortic atherosclerosis (HCC)    Arthritis    Cataract    Depression    Family history of colon cancer    Fibromyalgia    GERD (gastroesophageal reflux disease)    Hyperlipidemia    IBS (irritable bowel syndrome)    Rectal prolapse    Stomach ulcer    Tick bite    took antibiotics memorial day weekend 2021 on back     Past Surgical History:  Procedure Laterality Date   APPENDECTOMY     CESAREAN SECTION     CHOLECYSTECTOMY     COLONOSCOPY  04/18/2014   Diverticulosis.    ESOPHAGOGASTRODUODENOSCOPY  04/14/2016   Schatzkis ring. Hiatal hernia. Gastritis.    LIPOMA EXCISION     thermal ablation     TONSILLECTOMY      Family History  Problem Relation Age of Onset   Colon cancer Mother    Liver cancer Father    CAD Father    Hypertension Sister    Colon cancer Sister    Hyperlipidemia Brother    Gout Brother    Diabetes Maternal Grandmother    Diabetes Paternal Grandmother    Heart attack Other    Cancer Other        Leiomyosarcome and Liver   Migraines Other    Cancer Maternal Aunt        "female cancer, not breast"   Cancer Paternal Aunt        "female cancer, not breast"   Colon polyps Neg Hx    Esophageal cancer Neg Hx    Pancreatic cancer Neg Hx    Rectal cancer Neg Hx    Stomach cancer Neg Hx     Social History  Socioeconomic History   Marital status: Widowed    Spouse name: Not on file   Number of children: Not on file   Years of education: Not on file   Highest education level: Not on file  Occupational History   Not on file  Tobacco Use   Smoking status: Never   Smokeless tobacco: Never  Vaping Use   Vaping Use: Never used  Substance and Sexual Activity   Alcohol use: Never   Drug use: Never   Sexual activity: Not on file  Other Topics Concern   Not on file  Social History Narrative   Not on file   Social Determinants of Health   Financial Resource Strain: Not on file  Food Insecurity: No Food Insecurity   Worried About Running Out of Food in the Last Year: Never true   [place] in the Last Year: Never true  Transportation Needs: No Transportation Needs   Lack of Transportation (Medical): No   Lack of Transportation (Non-Medical): No  Physical Activity: Not on file  Stress: Not on file  Social Connections: Not on file  Intimate Partner  Violence: Not on file    Outpatient Medications Prior to Visit  Medication Sig Dispense Refill   diphenhydrAMINE (BENADRYL) 50 MG tablet Take 1 tablet (50 mg total) by mouth once for 1 dose. 1 tablet 0   Biotin 10 MG CAPS Take by mouth.     calcium citrate-vitamin D (CITRACAL+D) 315-200 MG-UNIT tablet Take 1 tablet by mouth 2 (two) times daily.     calcium-vitamin D (OSCAL WITH D) 500-200 MG-UNIT TABS tablet Take by mouth.     clotrimazole-betamethasone (LOTRISONE) cream Apply topically.     cyanocobalamin 1000 MCG tablet Take by mouth.     cyclobenzaprine (FLEXERIL) 5 MG tablet Take 1 tablet (5 mg total) by mouth 3 (three) times daily as needed for muscle spasms. 30 tablet 3   dicyclomine (BENTYL) 10 MG capsule Take 1 capsule (10 mg total) by mouth 3 (three) times daily before meals. As needed 90 capsule 8   gabapentin (NEURONTIN) 100 MG capsule Take 2 capsules (200 mg total) by mouth at bedtime. 180 capsule 0   magnesium hydroxide (MILK OF MAGNESIA) 400 MG/5ML suspension Take 15 mLs by mouth daily as needed for mild constipation.     omeprazole (PRILOSEC) 40 MG capsule TAKE 1 CAPSULE (40 MG TOTAL) BY MOUTH IN THE MORNING AND AT BEDTIME. 180 capsule 1   pyridoxine (B-6) 100 MG tablet Take by mouth.     rosuvastatin (CRESTOR) 10 MG tablet TAKE 1 TABLET BY MOUTH EVERY DAY 90 tablet 0   predniSONE (DELTASONE) 20 MG tablet prednisone 20 mg tablet  TAKE TWO TABLETS BY MOUTH DAILY     predniSONE (DELTASONE) 20 MG tablet Take 1 tablet (20 mg total) by mouth daily with breakfast. 7 tablet 0   predniSONE (DELTASONE) 50 MG tablet Pt to take 50 mg of prednisone on 01/19/21 at 9:00 PM, 50 mg of prednisone on 01/20/21 at 3:00 AM, and 50 mg of prednisone on 01/20/21 at 9:00 AM. Pt is also to take 50 mg of benadryl on 01/20/21 at 9:00 AM. Please call [phone] with any questions. 3 tablet 0   predniSONE (DELTASONE) 50 MG tablet Pt to take Prednisone 50mg  by mouth on Monday, May 02, 2021, at 12:30am, 6:30am  and 12:30pm.  She also is to take Benadryl 50mg  at 12:30pm this day with her last dose of Prednisone.  Please call [phone]  with any questions. 3 tablet 0   No facility-administered medications prior to visit.    Allergies  Allergen Reactions   Amoxicillin-Pot Clavulanate Nausea Only    Stomach pain    Iohexol Rash    HAD FACIAL RASH WITH CT CONTRAST IN 2006     Review of Systems  Constitutional:  Negative for chills and fever.  HENT:  Negative for congestion and sore throat.   Eyes: Negative.   Respiratory:  Negative for cough and shortness of breath.   Cardiovascular:  Negative for chest pain.  Gastrointestinal:  Negative for abdominal pain and nausea.  Endocrine: Negative.   Genitourinary:  Positive for dysuria, flank pain (right sided), frequency and urgency.  Musculoskeletal:  Negative for back pain.  Skin:  Positive for rash (right ear, under abdominal fold).  Allergic/Immunologic: Negative.   Neurological:  Positive for headaches.  Hematological: Negative.   Psychiatric/Behavioral: Negative.        Objective:    Physical Exam Vitals reviewed.  HENT:     Left Ear: Tympanic membrane and external ear normal.     Ears:     Comments: Patchy erythematous rash to right ear and neck area  Cardiovascular:     Rate and Rhythm: Normal rate and regular rhythm.     Pulses: Normal pulses.     Heart sounds: Normal heart sounds.  Pulmonary:     Effort: Pulmonary effort is normal.     Breath sounds: Normal breath sounds.  Abdominal:     General: Bowel sounds are normal.     Palpations: Abdomen is soft.  Skin:    Capillary Refill: Capillary refill takes less than 2 seconds.     Findings: Rash present. Rash is papular.          Comments: Erythematous rash noted under abdominal fold  Papular rash noted to right ear, neck  Neurological:     General: No focal deficit present.     Mental Status: She is alert and oriented to person, place, and time.  Psychiatric:         Mood and Affect: Mood normal.        Behavior: Behavior normal.    Pulse 76   Temp (!) 95.4 F (35.2 C)   Ht 5' 6.5" (1.689 m)   Wt 231 lb (104.8 kg)   SpO2 (!) 76%   BMI 36.73 kg/m  Wt Readings from Last 3 Encounters:  06/27/21 231 lb (104.8 kg)  05/26/21 229 lb (103.9 kg)  04/14/21 228 lb (103.4 kg)    Health Maintenance Due  Topic Date Due   Hepatitis C Screening  Never done   TETANUS/TDAP  Never done   Zoster Vaccines- Shingrix (1 of 2) Never done       Lab Results  Component Value Date   TSH 1.800 05/14/2020   Lab Results  Component Value Date   WBC 3.4 11/11/2020   HGB 14.1 11/11/2020   HCT 43.2 11/11/2020   MCV 89 11/11/2020   PLT 186 11/11/2020   Lab Results  Component Value Date   NA 142 11/11/2020   K 4.3 11/11/2020   CO2 23 11/11/2020   GLUCOSE 96 11/11/2020   BUN 11 11/11/2020   CREATININE 0.86 11/11/2020   BILITOT 0.6 11/11/2020   ALKPHOS 79 11/11/2020   AST 19 11/11/2020   ALT 18 11/11/2020   PROT 6.0 11/11/2020   ALBUMIN 4.0 11/11/2020   CALCIUM 9.0 11/11/2020   GFR 65.95 01/19/2020   Lab  Results  Component Value Date   CHOL 209 (H) 11/11/2020   Lab Results  Component Value Date   HDL 52 11/11/2020   Lab Results  Component Value Date   LDLCALC 128 (H) 11/11/2020   Lab Results  Component Value Date   TRIG 162 (H) 11/11/2020   Lab Results  Component Value Date   CHOLHDL 4.0 11/11/2020   No results found for: HGBA1C     Assessment & Plan:  1. Acute cystitis without hematuria - Urine Culture - POCT urinalysis dipstick - nitrofurantoin, macrocrystal-monohydrate, (MACROBID) 100 MG capsule; Take 1 capsule (100 mg total) by mouth 2 (two) times daily for 7 days.  Dispense: 14 capsule; Refill: 0  2. Candidiasis, intertrigo - fluconazole (DIFLUCAN) 150 MG tablet; Take 1 tablet (150 mg total) by mouth once for 1 dose.  Dispense: 1 tablet; Refill: 0 - ketoconazole (NIZORAL) 2 % cream; Apply to lower abdominal rash  Dispense:  15 g; Refill: 0  3. Dermatitis of right ear canal - triamcinolone cream (KENALOG) 0.1 %; Apply to right ear rash twice daily  Dispense: 30 g; Refill: 0    Rest and push fluids, especially water Take Macrobid twice daily for 7 days Apply Triamcinolone cream to right ear rash twice daily Apply Ketoconazole cream to lower abdomen daily Follow-up as needed  Follow-up: as needed if symptoms fail to improve or worsen  An After Visit Summary was printed and given to the patient.    I,Katherina A Bramblett,acting as a scribe for [employer], NP.,have documented all relevant documentation on the behalf of Rip Harbour, NP,as directed by  Rip Harbour, NP while in the presence of Rip Harbour, NP.    I, Rip Harbour, NP, have reviewed all documentation for this visit. The documentation on 06/27/21 for the exam, diagnosis, procedures, and orders are all accurate and complete.   Signed, Rip Harbour, NP [place] [phone]

## 2021-06-27 NOTE — Patient Instructions (Addendum)
Rest and push fluids, especially water Take Macrobid twice daily for 7 days Apply Triamcinolone cream to right ear rash twice daily Apply Ketoconazole cream to lower abdomen daily Follow-up as needed  Urinary Tract Infection, Adult A urinary tract infection (UTI) is an infection of any part of the urinary tract. The urinary tract includes: The kidneys. The ureters. The bladder. The urethra. These organs make, store, and get rid of pee (urine) in the body. What are the causes? This infection is caused by germs (bacteria) in your genital area. These germs grow and cause swelling (inflammation) of your urinary tract. What increases the risk? The following factors may make you more likely to develop this condition: Using a small, thin tube (catheter) to drain pee. Not being able to control when you pee or poop (incontinence). Being female. If you are female, these things can increase the risk: Using these methods to prevent pregnancy: A medicine that kills sperm (spermicide). A device that blocks sperm (diaphragm). Having low levels of a female hormone (estrogen). Being pregnant. You are more likely to develop this condition if: You have genes that add to your risk. You are sexually active. You take antibiotic medicines. You have trouble peeing because of: A prostate that is bigger than normal, if you are female. A blockage in the part of your body that drains pee from the bladder. A kidney stone. A nerve condition that affects your bladder. Not getting enough to drink. Not peeing often enough. You have other conditions, such as: Diabetes. A weak disease-fighting system (immune system). Sickle cell disease. Gout. Injury of the spine. What are the signs or symptoms? Symptoms of this condition include: Needing to pee right away. Peeing small amounts often. Pain or burning when peeing. Blood in the pee. Pee that smells bad or not like normal. Trouble peeing. Pee that is  cloudy. Fluid coming from the vagina, if you are female. Pain in the belly or lower back. Other symptoms include: Vomiting. Not feeling hungry. Feeling mixed up (confused). This may be the first symptom in older adults. Being tired and grouchy (irritable). A fever. Watery poop (diarrhea). How is this treated? Taking antibiotic medicine. Taking other medicines. Drinking enough water. In some cases, you may need to see a specialist. Follow these instructions at home: Medicines Take over-the-counter and prescription medicines only as told by your doctor. If you were prescribed an antibiotic medicine, take it as told by your doctor. Do not stop taking it even if you start to feel better. General instructions Make sure you: Pee until your bladder is empty. Do not hold pee for a long time. Empty your bladder after sex. Wipe from front to back after peeing or pooping if you are a female. Use each tissue one time when you wipe. Drink enough fluid to keep your pee pale yellow. Keep all follow-up visits. Contact a doctor if: You do not get better after 1-2 days. Your symptoms go away and then come back. Get help right away if: You have very bad back pain. You have very bad pain in your lower belly. You have a fever. You have chills. You feeling like you will vomit or you vomit. Summary A urinary tract infection (UTI) is an infection of any part of the urinary tract. This condition is caused by germs in your genital area. There are many risk factors for a UTI. Treatment includes antibiotic medicines. Drink enough fluid to keep your pee pale yellow. This information is not intended to replace  advice given to you by your health care provider. Make sure you discuss any questions you have with your health care provider. Document Revised: 04/23/2020 Document Reviewed: 04/23/2020 Elsevier Patient Education  [place]. Rash, Adult A rash is a change in the color of your skin. A  rash can also change the way your skin feels. There are many different conditions and factors that can cause a rash. Follow these instructions at home: The goal of treatment is to stop the itching and keep the rash from spreading. Watch for any changes in your symptoms. Let your doctor know about them. Follow these instructions to help with your condition: Medicine Take or apply over-the-counter and prescription medicines only as told by your doctor. These may include medicines: To treat red or swollen skin (corticosteroid creams). To treat itching. To treat an allergy (oral antihistamines). To treat very bad symptoms (oral corticosteroids).  Skin care Put cool cloths (compresses) on the affected areas. Do not scratch or rub your skin. Avoid covering the rash. Make sure that the rash is exposed to air as much as possible. Managing itching and discomfort Avoid hot showers or baths. These can make itching worse. A cold shower may help. Try taking a bath with: Epsom salts. You can get these at your local pharmacy or grocery store. Follow the instructions on the package. Baking soda. Pour a small amount into the bath as told by your doctor. Colloidal oatmeal. You can get this at your local pharmacy or grocery store. Follow the instructions on the package. Try putting baking soda paste onto your skin. Stir water into baking soda until it gets like a paste. Try putting on a lotion that relieves itchiness (calamine lotion). Keep cool and out of the sun. Sweating and being hot can make itching worse. General instructions  Rest as needed. Drink enough fluid to keep your pee (urine) pale yellow. Wear loose-fitting clothing. Avoid scented soaps, detergents, and perfumes. Use gentle soaps, detergents, perfumes, and other cosmetic products. Avoid anything that causes your rash. Keep a journal to help track what causes your rash. Write down: What you eat. What cosmetic products you use. What you  drink. What you wear. This includes jewelry. Keep all follow-up visits as told by your doctor. This is important. Contact a doctor if: You sweat at night. You lose weight. You pee (urinate) more than normal. You pee less than normal, or you notice that your pee is a darker color than normal. You feel weak. You throw up (vomit). Your skin or the whites of your eyes look yellow (jaundice). Your skin: Tingles. Is numb. Your rash: Does not go away after a few days. Gets worse. You are: More thirsty than normal. More tired than normal. You have: New symptoms. Pain in your belly (abdomen). A fever. Watery poop (diarrhea). Get help right away if: You have a fever and your symptoms suddenly get worse. You start to feel mixed up (confused). You have a very bad headache or a stiff neck. You have very bad joint pains or stiffness. You have jerky movements that you cannot control (seizure). Your rash covers all or most of your body. The rash may or may not be painful. You have blisters that: Are on top of the rash. Grow larger. Grow together. Are painful. Are inside your nose or mouth. You have a rash that: Looks like purple pinprick-sized spots all over your body. Has a "bull's eye" or looks like a target. Is red and painful, causes  your skin to peel, and is not from being in the sun too long. Summary A rash is a change in the color of your skin. A rash can also change the way your skin feels. The goal of treatment is to stop the itching and keep the rash from spreading. Take or apply over-the-counter and prescription medicines only as told by your doctor. Contact a doctor if you have new symptoms or symptoms that get worse. Keep all follow-up visits as told by your doctor. This is important. This information is not intended to replace advice given to you by your health care provider. Make sure you discuss any questions you have with your health care provider. Document Revised:  01/03/2019 Document Reviewed: 04/15/2018 Elsevier Patient Education  2022 [place] is skin irritation (inflammation) that happens in warm, moist areas of the body. The irritation can cause a rash and make skin raw and itchy. The rash is usually pink or red. It happens mostly between folds of skin or where skin rubs together, such as: Between the toes. In the armpits. In the groin area. Under the belly. Under the breasts. Around the butt area. This condition is not passed from person to person (is not contagious). What are the causes? Heat, moisture, rubbing, and not enough air movement. The condition can be made worse by: Sweat. Bacteria. A fungus, such as yeast. What increases the risk? Moisture in your skin folds. You are more likely to develop this condition if you: Have diabetes. Are overweight. Are not able to move around. Live in a warm and moist climate. Wear splints, braces, or other medical devices. Are not able to control your pee (urine) or poop (stool). What are the signs or symptoms? A pink or red skin rash in the skin fold or near the skin fold. Raw or scaly skin. Itching. A burning feeling. Bleeding. Leaking fluid. A bad smell. How is this treated? Cleaning and drying your skin. Taking an antibiotic medicine or using an antibiotic skin cream for a bacterial infection. Using an antifungal cream on your skin or taking pills for an infection that was caused by a fungus, such as yeast. Using a steroid ointment to stop the itching and irritation. Separating the skin fold with a clean cotton cloth to absorb moisture and allow air to flow into the area. Follow these instructions at home: Keep the affected area clean and dry. Do not scratch your skin. Stay cool as much as you can. Use an air conditioner or a fan, if you have one. Apply over-the-counter and prescription medicines only as told by your doctor. If you were prescribed an  antibiotic medicine, use it as told by your doctor. Do not stop using the antibiotic even if your condition starts to get better. Keep all follow-up visits as told by your doctor. This is important. How is this prevented?  Stay at a healthy weight. Take care of your feet. This is very important if you have diabetes. You should: Wear shoes that fit well. Keep your feet dry. Wear clean cotton or wool socks. Protect the skin in your groin and butt area as told by your doctor. To do this: Follow a regular cleaning routine. Use creams, powders, or ointments that protect your skin. Change protection pads often. Do not wear tight clothes. Wear clothes that: Are loose. Take moisture away from your body. Are made of cotton. Wear a bra that gives good support, if needed. Shower and dry yourself well after being  active. Use a hair dryer on a cool setting to dry between skin folds. Keep your blood sugar under control if you have diabetes. Contact a doctor if: Your symptoms do not get better with treatment. Your symptoms get worse or they spread. You notice more redness and warmth. You have a fever. Summary Intertrigo is skin irritation that occurs when folds of skin rub together. This condition is caused by heat, moisture, and rubbing. This condition may be treated by cleaning and drying your skin and with medicines. Apply over-the-counter and prescription medicines only as told by your doctor. Keep all follow-up visits as told by your doctor. This is important. This information is not intended to replace advice given to you by your health care provider. Make sure you discuss any questions you have with your health care provider. Document Revised: 06/20/2018 Document Reviewed: 06/20/2018 Elsevier Patient Education  2022 [employer].

## 2021-06-28 ENCOUNTER — Telehealth: Payer: PPO

## 2021-07-01 LAB — URINE CULTURE

## 2021-07-04 ENCOUNTER — Encounter: Payer: Self-pay | Admitting: Nurse Practitioner

## 2021-07-04 ENCOUNTER — Ambulatory Visit (INDEPENDENT_AMBULATORY_CARE_PROVIDER_SITE_OTHER): Payer: PPO | Admitting: Nurse Practitioner

## 2021-07-04 ENCOUNTER — Other Ambulatory Visit: Payer: Self-pay

## 2021-07-04 VITALS — BP 120/68 | HR 75 | Temp 96.9°F | Ht 66.5 in | Wt 229.0 lb

## 2021-07-04 DIAGNOSIS — R0789 Other chest pain: Secondary | ICD-10-CM | POA: Diagnosis not present

## 2021-07-04 DIAGNOSIS — I358 Other nonrheumatic aortic valve disorders: Secondary | ICD-10-CM | POA: Diagnosis not present

## 2021-07-04 DIAGNOSIS — R21 Rash and other nonspecific skin eruption: Secondary | ICD-10-CM | POA: Diagnosis not present

## 2021-07-04 DIAGNOSIS — G4733 Obstructive sleep apnea (adult) (pediatric): Secondary | ICD-10-CM | POA: Diagnosis not present

## 2021-07-04 DIAGNOSIS — E785 Hyperlipidemia, unspecified: Secondary | ICD-10-CM | POA: Diagnosis not present

## 2021-07-04 DIAGNOSIS — R002 Palpitations: Secondary | ICD-10-CM | POA: Diagnosis not present

## 2021-07-04 MED ORDER — TRIAMCINOLONE ACETONIDE 40 MG/ML IJ SUSP
80.0000 mg | Freq: Once | INTRAMUSCULAR | Status: AC
  Administered 2021-07-04: 80 mg via INTRAMUSCULAR

## 2021-07-04 NOTE — Progress Notes (Signed)
Subjective:  Patient ID: Patricia Leonard, female    DOB: Oct 08, 1950  Age: 70 y.o. MRN: [id number]  Chief Complaint  Patient presents with  . Rash     HPI  Patricia Leonard is a 70 year old Caucasian female that presents with persistent rash to left upper arm and right thigh. Onset was approximately 3-weeks ago. She was seen in the office on 06/27/21 and prescribed topical Triamcinolone cream. She was reluctant to take systemic steroids due to two recent courses of steroids prescribed for an orthopedic procedure. She tells me that [place]. She is unsure of what cleaning products were used to laundered the bed linens at the condo due to a new housekeeper. In addition, she tells me her dog that she was sleeping with was treated for fleas recently. She denies any known exposure to ticks or bed-bugs. She is scheduled to see dermatology on 07/08/21 for suture removal of a skin lesion excision.     Current Outpatient Medications on File Prior to Visit  Medication Sig Dispense Refill  . diphenhydrAMINE (BENADRYL) 50 MG tablet Take 1 tablet (50 mg total) by mouth once for 1 dose. 1 tablet 0  . Biotin 10 MG CAPS Take by mouth.    . calcium citrate-vitamin D (CITRACAL+D) 315-200 MG-UNIT tablet Take 1 tablet by mouth 2 (two) times daily.    . calcium-vitamin D (OSCAL WITH D) 500-200 MG-UNIT TABS tablet Take by mouth.    . clotrimazole-betamethasone (LOTRISONE) cream Apply topically.    . cyanocobalamin 1000 MCG tablet Take by mouth.    . cyclobenzaprine (FLEXERIL) 5 MG tablet Take 1 tablet (5 mg total) by mouth 3 (three) times daily as needed for muscle spasms. 30 tablet 3  . dicyclomine (BENTYL) 10 MG capsule Take 1 capsule (10 mg total) by mouth 3 (three) times daily before meals. As needed 90 capsule 8  . gabapentin (NEURONTIN) 100 MG capsule Take 2 capsules (200 mg total) by mouth at bedtime. 180 capsule 0  . ketoconazole (NIZORAL) 2 % cream Apply to lower  abdominal rash 15 g 0  . magnesium hydroxide (MILK OF MAGNESIA) 400 MG/5ML suspension Take 15 mLs by mouth daily as needed for mild constipation.    . nitrofurantoin, macrocrystal-monohydrate, (MACROBID) 100 MG capsule Take 1 capsule (100 mg total) by mouth 2 (two) times daily for 7 days. 14 capsule 0  . omeprazole (PRILOSEC) 40 MG capsule TAKE 1 CAPSULE (40 MG TOTAL) BY MOUTH IN THE MORNING AND AT BEDTIME. 180 capsule 1  . pyridoxine (B-6) 100 MG tablet Take by mouth.    . rosuvastatin (CRESTOR) 10 MG tablet TAKE 1 TABLET BY MOUTH EVERY DAY 90 tablet 0  . triamcinolone cream (KENALOG) 0.1 % Apply to right ear rash twice daily 30 g 0   No current facility-administered medications on file prior to visit.   Past Medical History:  Diagnosis Date  . Allergy   . Anemia   . Anxiety   . Aortic atherosclerosis ([place])   . Arthritis   . Cataract   . Depression   . Family history of colon cancer   . Fibromyalgia   . GERD (gastroesophageal reflux disease)   . Hyperlipidemia   . IBS (irritable bowel syndrome)   . Rectal prolapse   . Stomach ulcer   . Tick bite    took antibiotics memorial day weekend 2021 on back   Past Surgical History:  Procedure Laterality Date  .  APPENDECTOMY    . CESAREAN SECTION    . CHOLECYSTECTOMY    . COLONOSCOPY  04/18/2014   Diverticulosis.   Marland Kitchen ESOPHAGOGASTRODUODENOSCOPY  04/14/2016   Schatzkis ring. Hiatal hernia. Gastritis.   Marland Kitchen LIPOMA EXCISION    . thermal ablation    . TONSILLECTOMY      Family History  Problem Relation Age of Onset  . Colon cancer Mother   . Liver cancer Father   . CAD Father   . Hypertension Sister   . Colon cancer Sister   . Hyperlipidemia Brother   . Gout Brother   . Diabetes Maternal Grandmother   . Diabetes Paternal Grandmother   . Heart attack Other   . Cancer Other        Leiomyosarcome and Liver  . Migraines Other   . Cancer Maternal Aunt        "female cancer, not breast"  . Cancer Paternal Aunt        "female  cancer, not breast"  . Colon polyps Neg Hx   . Esophageal cancer Neg Hx   . Pancreatic cancer Neg Hx   . Rectal cancer Neg Hx   . Stomach cancer Neg Hx    Social History   Socioeconomic History  . Marital status: Widowed    Spouse name: Not on file  . Number of children: Not on file  . Years of education: Not on file  . Highest education level: Not on file  Occupational History  . Not on file  Tobacco Use  . Smoking status: Never  . Smokeless tobacco: Never  Vaping Use  . Vaping Use: Never used  Substance and Sexual Activity  . Alcohol use: Never  . Drug use: Never  . Sexual activity: Not on file  Other Topics Concern  . Not on file  Social History Narrative  . Not on file   Social Determinants of Health   Financial Resource Strain: Not on file  Food Insecurity: No Food Insecurity  . Worried About Charity fundraiser in the Last Year: Never true  . Ran Out of Food in the Last Year: Never true  Transportation Needs: No Transportation Needs  . Lack of Transportation (Medical): No  . Lack of Transportation (Non-Medical): No  Physical Activity: Not on file  Stress: Not on file  Social Connections: Not on file    Review of Systems  Constitutional:  Negative for chills, fatigue and fever.  HENT:  Negative for congestion, ear pain, postnasal drip, rhinorrhea, sinus pressure, sinus pain and sore throat.   Respiratory:  Negative for cough and shortness of breath.   Cardiovascular:  Negative for chest pain.  Gastrointestinal:  Negative for diarrhea and nausea.  Skin:  Positive for rash.    Objective:  Pulse 75   Temp (!) 96.9 F (36.1 C)   Ht 5' 6.5" (1.689 m)   Wt 229 lb (103.9 kg)   SpO2 99%   BMI 36.41 kg/m   BP/Weight 07/04/2021 56/11/8754 12/26/3293  Systolic BP - 188 416  Diastolic BP - 606 82  Wt. (Lbs) 229 231 229  BMI 36.41 36.73 36.96    Physical Exam Vitals reviewed.  Skin:    General: Skin is warm and dry.     Capillary Refill: Capillary  refill takes less than 2 seconds.     Findings: Rash present. Rash is papular.          Comments: Papular rash noted to left upper arm and right thigh  Neurological:     General: No focal deficit present.     Mental Status: She is alert and oriented to person, place, and time.  Psychiatric:        Mood and Affect: Mood normal.        Behavior: Behavior normal.      Lab Results  Component Value Date   WBC 3.4 11/11/2020   HGB 14.1 11/11/2020   HCT 43.2 11/11/2020   PLT 186 11/11/2020   GLUCOSE 96 11/11/2020   CHOL 209 (H) 11/11/2020   TRIG 162 (H) 11/11/2020   HDL 52 11/11/2020   LDLCALC 128 (H) 11/11/2020   ALT 18 11/11/2020   AST 19 11/11/2020   NA 142 11/11/2020   K 4.3 11/11/2020   CL 105 11/11/2020   CREATININE 0.86 11/11/2020   BUN 11 11/11/2020   CO2 23 11/11/2020   TSH 1.800 05/14/2020      Assessment & Plan:   . 1. Rash - triamcinolone acetonide (KENALOG-40) injection 80 mg  -continue applying Triamcinolone cream to rash to left upper arm and right thigh  Kenalog 80 mg injection given office Continue Triamcinolone cream to right leg and left arm Follow-up as needed      Follow-up: PRN  An After Visit Summary was printed and given to the patient.  I, Rip Harbour, NP, have reviewed all documentation for this visit. The documentation on 07/04/21 for the exam, diagnosis, procedures, and orders are all accurate and complete.   Signed, Rip Harbour, NP [facility] [phone]

## 2021-07-04 NOTE — Patient Instructions (Signed)
Kenalog 80 mg injection given office Continue Triamcinolone cream to right leg and left arm Follow-up as needed  Rash, Adult A rash is a change in the color of your skin. A rash can also change the way your skin feels. There are many different conditions and factors that can cause a rash. Follow these instructions at home: The goal of treatment is to stop the itching and keep the rash from spreading. Watch for any changes in your symptoms. Let your doctor know about them. Follow these instructions to help with your condition: Medicine Take or apply over-the-counter and prescription medicines only as told by your doctor. These may include medicines: To treat red or swollen skin (corticosteroid creams). To treat itching. To treat an allergy (oral antihistamines). To treat very bad symptoms (oral corticosteroids).  Skin care Put cool cloths (compresses) on the affected areas. Do not scratch or rub your skin. Avoid covering the rash. Make sure that the rash is exposed to air as much as possible. Managing itching and discomfort Avoid hot showers or baths. These can make itching worse. A cold shower may help. Try taking a bath with: Epsom salts. You can get these at your local pharmacy or grocery store. Follow the instructions on the package. Baking soda. Pour a small amount into the bath as told by your doctor. Colloidal oatmeal. You can get this at your local pharmacy or grocery store. Follow the instructions on the package. Try putting baking soda paste onto your skin. Stir water into baking soda until it gets like a paste. Try putting on a lotion that relieves itchiness (calamine lotion). Keep cool and out of the sun. Sweating and being hot can make itching worse. General instructions  Rest as needed. Drink enough fluid to keep your pee (urine) pale yellow. Wear loose-fitting clothing. Avoid scented soaps, detergents, and perfumes. Use gentle soaps, detergents, perfumes, and other  cosmetic products. Avoid anything that causes your rash. Keep a journal to help track what causes your rash. Write down: What you eat. What cosmetic products you use. What you drink. What you wear. This includes jewelry. Keep all follow-up visits as told by your doctor. This is important. Contact a doctor if: You sweat at night. You lose weight. You pee (urinate) more than normal. You pee less than normal, or you notice that your pee is a darker color than normal. You feel weak. You throw up (vomit). Your skin or the whites of your eyes look yellow (jaundice). Your skin: Tingles. Is numb. Your rash: Does not go away after a few days. Gets worse. You are: More thirsty than normal. More tired than normal. You have: New symptoms. Pain in your belly (abdomen). A fever. Watery poop (diarrhea). Get help right away if: You have a fever and your symptoms suddenly get worse. You start to feel mixed up (confused). You have a very bad headache or a stiff neck. You have very bad joint pains or stiffness. You have jerky movements that you cannot control (seizure). Your rash covers all or most of your body. The rash may or may not be painful. You have blisters that: Are on top of the rash. Grow larger. Grow together. Are painful. Are inside your nose or mouth. You have a rash that: Looks like purple pinprick-sized spots all over your body. Has a "bull's eye" or looks like a target. Is red and painful, causes your skin to peel, and is not from being in the sun too long. Summary A rash  is a change in the color of your skin. A rash can also change the way your skin feels. The goal of treatment is to stop the itching and keep the rash from spreading. Take or apply over-the-counter and prescription medicines only as told by your doctor. Contact a doctor if you have new symptoms or symptoms that get worse. Keep all follow-up visits as told by your doctor. This is important. This  information is not intended to replace advice given to you by your health care provider. Make sure you discuss any questions you have with your health care provider. Document Revised: 01/03/2019 Document Reviewed: 04/15/2018 Elsevier Patient Education  2022 [employer].

## 2021-07-07 ENCOUNTER — Telehealth: Payer: Self-pay

## 2021-07-07 NOTE — Chronic Care Management (AMB) (Signed)
Called pt and r/s follow up with the CPP for 07/13/21 @ 2:15pm. Pt has confirmed  Elray Mcgregor, [facility] Pharmacist Assistant  [phone]

## 2021-07-13 ENCOUNTER — Ambulatory Visit (INDEPENDENT_AMBULATORY_CARE_PROVIDER_SITE_OTHER): Payer: PPO

## 2021-07-13 ENCOUNTER — Other Ambulatory Visit: Payer: Self-pay

## 2021-07-13 DIAGNOSIS — K219 Gastro-esophageal reflux disease without esophagitis: Secondary | ICD-10-CM

## 2021-07-13 DIAGNOSIS — E782 Mixed hyperlipidemia: Secondary | ICD-10-CM

## 2021-07-13 DIAGNOSIS — I700 Atherosclerosis of aorta: Secondary | ICD-10-CM

## 2021-07-13 DIAGNOSIS — M810 Age-related osteoporosis without current pathological fracture: Secondary | ICD-10-CM

## 2021-07-13 NOTE — Patient Instructions (Signed)
Visit Information   Goals Addressed   None    Patient Care Plan: [facility] Pharmacy Care Plan     Problem Identified: IBS, GERD, Hyperlipidemia   Priority: High  Onset Date: 11/08/2020     Long-Range Goal: disease management   Start Date: 11/08/2020  Expected End Date: 11/08/2021  Recent Progress: On track  Priority: High  Note:   Current Barriers:  Unable to achieve control of cholesterol    Pharmacist Clinical Goal(s):  Over the next 90 days, patient will achieve control of cholesterol  as evidenced by lipid panel adhere to plan to optimize therapeutic regimen for cholesterol as evidenced by report of adherence to recommended medication management changes through collaboration with PharmD and provider.      Interventions: 1:1 collaboration with Rochel Brome, MD regarding development and update of comprehensive plan of care as evidenced by provider attestation and co-signature Inter-disciplinary care team collaboration (see longitudinal plan of care) Comprehensive medication review performed; medication list updated in electronic medical record  Hyperlipidemia: (LDL goal < 100) -uncontrolled -Current treatment: Crestor 10 mg twice a week - but has not started. Waiting to talk to cardiologist.  -Medications previously tried: red yeast rice  -Current dietary patterns: likes vegetables -Current exercise habits: walking her dog and walking at the beach.  -Educated on Cholesterol goals;  Benefits of statin for ASCVD risk reduction; Importance of limiting foods high in cholesterol; Exercise goal of 150 minutes per week; -Counseled on diet and exercise extensively  August 2022: Patient has not taken Crestor - wants to wait and talk to Dr. Otho Perl about Crestor before starting it. Discussed and recommended starting twice a week.  October 2022: Patient still hasn't started but agreed to start today. Also, has no f/u with PCP so she'll start today and schedule f/u for 4-6 weeks from now to  get labs  Osteoporosis / Osteopenia (Goal get updated Dexa Scan to assess bones) -uncertain control -Last DEXA Scan: scheduled for 12/2020 -Current treatment  Calcium citrate D bid - recommended patient begin due to PPI -Medications previously tried: calcium with D  -Recommend [phone] units of vitamin D daily. Recommend 1200 mg of calcium daily from dietary and supplemental sources. Recommend weight-bearing and muscle strengthening exercises for building and maintaining bone density. -Counseled on diet and exercise extensively Recommended Calicum citrate with D twice daily and updated Dexa scan.   Plan: At goal,  patient stable/ symptoms controlled   IBS-C (Goal: manage constipation and stomach cramping) -controlled -Current treatment  dicyclomine 10 mg tid Librax tid before meals -Medications previously tried: none reported  -Counseled on diet and exercise extensively Recommended to continue current medication. Patient reports occasional low belly pain that isn't sure of the origin. Reports that her mom died of cancer and she follows with OBGYN annually and encouraged patient to call for sooner appointment if has the cramping pain again.   GERD (Goal: symptom control/management) -uncontrolled -Current treatment  omeprazole 40 mg twice daily  -Medications previously tried: none reported  -Recommended to continue current medication Counseled on small meals, elevating head of bed and avoiding tight waistbands - Large hiatal hernia - has to use caution eating too much.   Pain  (Goal: manage pain) -controlled Pain Scale: October 2022: 0/10 -Current treatment  gabapentin 100 mg - 2 capsules at bedtime -Medications previously tried: none reported  -Counseled on diet and exercise extensively Recommended to continue current medication  Calling Dr. Tamala Julian to schedule epidural for pain relief.  October 2022: Stopped Gabapentin,  told me she didn't need for pain anymore  Health  Maintenance -Vaccine gaps: none on file   -Patient is satisfied with current therapy and denies issues -Counseled on diet and exercise extensively Recommended to continue current medication   Patient Goals/Self-Care Activities Over the next 90 days, patient will:  - take medications as prescribed  Follow Up Plan: Telephone follow up appointment with care management team member scheduled for: April 2023  [place] Constable, Sherian Rein.D. - [phone]       The patient verbalized understanding of instructions, educational materials, and care plan provided today and declined offer to receive copy of patient instructions, educational materials, and care plan.  The pharmacy team will reach out to the patient again over the next 90 days.   Lane Hacker, [facility]

## 2021-07-13 NOTE — Progress Notes (Signed)
Chronic Care Management Pharmacy Note  07/13/2021 Name:  Patricia Leonard MRN:  [id number] DOB:  24-Mar-1951   Plan Recommendations:  Patient will start statin today and then schedule f/u (Hasn't been seen for labs since Feb) for next month Could we update her statin script, it is written for QD but she told me you said to only take 2x/week. This will help START Ratings Coordinated with Shelle Iron to schedule labs, f/u and AWV   Summary: Pleasant 70 year old female presetns for f/u CCM visit. She became a widow in Jan 2020 after taking care of her partner for 5 years. She has twin songs and an 34 month grandson. She has a double doodle (dog) named Alba Cory and she loves playing cards with friends and will take any chance she has to eat out with people. 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 year old female who is a primary patient of Cox, Kirsten, MD.  The CCM team was consulted for assistance with disease management and care coordination needs.    Engaged with patient by telephone for follow up visit in response to provider referral for pharmacy case management and/or care coordination services.   Consent to Services:  The patient was given the following information about Chronic Care Management services today, agreed to services, and gave verbal consent: 1. CCM service includes personalized support from designated clinical staff supervised by the primary care provider, including individualized plan of care and coordination with other care providers 2. 24/7 contact phone numbers for assistance for urgent and routine care needs. 3. Service will only be billed when office clinical staff spend 20 minutes or more in a month to coordinate care. 4. Only one practitioner may furnish and bill the service in a calendar month. 5.The patient may stop CCM services at any time (effective at the end of the month) by  phone call to the office staff. 6. The patient will be responsible for cost sharing (co-pay) of up to 20% of the service fee (after annual deductible is met). Patient agreed to services and consent obtained.  Patient Care Team: Rochel Brome, MD as PCP - General (Family Medicine) Jackquline Denmark, MD as Consulting Physician (Gastroenterology) Dian Queen, MD as Consulting Physician (Obstetrics and Gynecology) Lyndal Pulley, DO as Consulting Physician (Family Medicine) Burnice Logan, [facility] (Inactive) as Pharmacist (Pharmacist) Flossie Buffy., MD as Referring Physician (Cardiology)  Recent office visits: 11/16/2020 - start Crestor 10 mg twice weekly? Increase fiber and water. Ordering heart monitor for palpitations. Recommend loratadine 10 mg daily.  10/12/2019 - continue mucinex. Ordered rapid COVID test. Negatvie test.  05/14/2020 - voltaren gel OTC for joint pain. No aleve may use tylenol. Kenalog shot. Call hand surgeon to call back. Get COVID vaccine.   Recent consult visits: 11/22/2020 - Cardio - encouraged low salt diet. Ordered echocadiogram. Encouraged regular exercise. Recommend trying CPAP again.  09/23/2020 - sport's medicine - physical therapy. Gabapentin continued.  08/23/2020 - Sport's medicine - xrays today. Ice 20 minutes bid. Exercises three times a week. Gabapentin 200 mg at night but can start 100 mg night. Tart Cherry Extract 1200 mg at night. Vitamin D 2000 IU daily.  06/07/2020 - general surgeon - constipation. Refer to pelvic floor PT   Hospital visits: None in previous 6 months  Objective:  Lab Results  Component Value Date   CREATININE 0.86  11/11/2020   BUN 11 11/11/2020   GFR 65.95 01/19/2020   GFRNONAA 68 11/11/2020   GFRAA 79 11/11/2020   NA 142 11/11/2020   K 4.3 11/11/2020   CALCIUM 9.0 11/11/2020   CO2 23 11/11/2020    Lab Results  Component Value Date/Time   GFR 65.95 01/19/2020 12:32 PM    Last diabetic Eye exam: No results found for:  HMDIABEYEEXA  Last diabetic Foot exam: No results found for: HMDIABFOOTEX   Lab Results  Component Value Date   CHOL 209 (H) 11/11/2020   HDL 52 11/11/2020   LDLCALC 128 (H) 11/11/2020   TRIG 162 (H) 11/11/2020   CHOLHDL 4.0 11/11/2020    Hepatic Function Latest Ref Rng & Units 11/11/2020 05/14/2020 01/19/2020  Total Protein 6.0 - 8.5 g/dL 6.0 6.5 7.1  Albumin 3.7 - 4.7 g/dL 4.0 4.1 4.2  AST 0 - 40 IU/L _0 ALT 0 - 32 IU/L _1 Alk Phosphatase 44 - 121 IU/L 79 88 77  Total Bilirubin 0.0 - 1.2 mg/dL 0.6 0.5 0.6    Lab Results  Component Value Date/Time   TSH 1.800 05/14/2020 09:34 AM    CBC Latest Ref Rng & Units 11/11/2020 05/14/2020 01/19/2020  WBC 3.4 - 10.8 x10E3/uL 3.4 3.9 5.0  Hemoglobin 11.1 - 15.9 g/dL 14.1 14.0 14.2  Hematocrit 34.0 - 46.6 % 43.2 41.3 42.3  Platelets 150 - 450 x10E3/uL 186 216 199.0    No results found for: VD25OH  Clinical ASCVD: Yes  The 10-year ASCVD risk score (Arnett DK, et al., 2019) is: 10.6%   Values used to calculate the score:     Age: 70 years     Sex: Female     Is Non-Hispanic African American: No     Diabetic: No     Tobacco smoker: No     Systolic Blood Pressure: 355 mmHg     Is BP treated: No     HDL Cholesterol: 52 mg/dL     Total Cholesterol: 209 mg/dL    Depression screen [facility] 2/9 06/27/2021 01/19/2021 11/16/2020  Decreased Interest 0 0 0  Down, Depressed, Hopeless 0 0 0  PHQ - 2 Score 0 0 0     Social History   Tobacco Use  Smoking Status Never  Smokeless Tobacco Never   BP Readings from Last 3 Encounters:  07/04/21 120/68  06/27/21 (!) 140/100  05/26/21 122/82   Pulse Readings from Last 3 Encounters:  07/04/21 75  06/27/21 76  05/26/21 78   Wt Readings from Last 3 Encounters:  07/04/21 229 lb (103.9 kg)  06/27/21 231 lb (104.8 kg)  05/26/21 229 lb (103.9 kg)    Assessment/Interventions: Review of patient past medical history, allergies, medications, health status, including review of consultants  reports, laboratory and other test data, was performed as part of comprehensive evaluation and provision of chronic care management services.   SDOH:  (Social Determinants of Health) assessments and interventions performed: Yes   CCM Care Plan  Allergies  Allergen Reactions   Amoxicillin-Pot Clavulanate Nausea Only    Stomach pain    Iohexol Rash    HAD FACIAL RASH WITH CT CONTRAST IN 2006     Medications Reviewed Today     Reviewed by Lane Hacker, [facility] (Pharmacist) on 07/13/21 at 1512  Med List Status: <None>   Medication Order Taking? Sig Documenting Provider Last Dose Status Informant  Biotin 10 MG CAPS [id number] Yes Take by mouth. [provider] Taking Active   calcium citrate-vitamin D (CITRACAL+D) 315-200 MG-UNIT tablet [id number] No Take 1 tablet by mouth 2 (two) times daily.  Patient not taking: Reported on 07/13/2021   [provider] Not Taking Consider Medication Status and Discontinue   calcium-vitamin D (OSCAL WITH D) 500-200 MG-UNIT TABS tablet [id number] Yes Take by mouth. [provider] Taking Active   clotrimazole-betamethasone (LOTRISONE) cream [id number]  Apply topically. [provider]  Active   cyanocobalamin 1000 MCG tablet [id number] Yes Take by mouth. [provider] Taking Active   cyclobenzaprine (FLEXERIL) 5 MG tablet [id number] Yes Take 1 tablet (5 mg total) by mouth 3 (three) times daily as needed for muscle spasms. Lyndal Pulley, DO Taking Active   dicyclomine (BENTYL) 10 MG capsule [id number] Yes Take 1 capsule (10 mg total) by mouth 3 (three) times daily before meals. As needed Esterwood, Amy S, PA-C Taking Active   diphenhydrAMINE (BENADRYL) 50 MG tablet [id number]  Take 1 tablet (50 mg total) by mouth once for 1 dose. Titus Dubin, MD  Expired 04/28/21 2359   gabapentin (NEURONTIN) 100 MG capsule [id number] No Take 2 capsules (200 mg total) by mouth at bedtime.  Patient not taking: Reported on  07/13/2021   Lyndal Pulley, DO Not Taking Consider Medication Status and Discontinue   ketoconazole (NIZORAL) 2 % cream [id number] Yes Apply to lower abdominal rash Rip Harbour, NP Taking Active   magnesium hydroxide (MILK OF MAGNESIA) 400 MG/5ML suspension [id number] Yes Take 15 mLs by mouth daily as needed for mild constipation. [provider] Taking Active   omeprazole (PRILOSEC) 40 MG capsule [id number] Yes TAKE 1 CAPSULE (40 MG TOTAL) BY MOUTH IN THE MORNING AND AT BEDTIME. Jackquline Denmark, MD Taking Active   pyridoxine (B-6) 100 MG tablet [id number] Yes Take by mouth. [provider] Taking Active   rosuvastatin (CRESTOR) 10 MG tablet [id number] No TAKE 1 TABLET BY MOUTH EVERY DAY  Patient not taking: Reported on 07/13/2021   Rochel Brome, MD Not Taking Active   triamcinolone cream (KENALOG) 0.1 % [id number] Yes Apply to right ear rash twice daily Rip Harbour, NP Taking Active             Patient Active Problem List   Diagnosis Date Noted   Aortic valve sclerosis 01/03/2021   Dyslipidemia 11/22/2020   Elevated BP without diagnosis of hypertension 11/22/2020   OSA (obstructive sleep apnea) 11/22/2020   Palpitations 11/22/2020   Right hip pain 11/22/2020   Greater trochanteric bursitis of right hip 09/23/2020   Cervical disc disorder with radiculopathy of cervical region 08/23/2020   Lumbar radiculopathy 08/23/2020   Rectocele 02/09/2020   Mixed hyperlipidemia 02/09/2020   Aortic atherosclerosis ([place]) 02/09/2020   Irritable bowel syndrome with constipation 02/09/2020   Hiatal hernia 02/09/2020   Migraine 02/09/2020   GERD (gastroesophageal reflux disease) 01/20/2020   History of IBS 01/20/2020   Hx of adenomatous colonic polyps 01/20/2020   Nuclear cataract 09/13/2011   Hyperopia with astigmatism 09/13/2011     There is no immunization history on file for this patient.  Conditions to be addressed/monitored:  Hyperlipidemia, GERD and IBS-C.    Care Plan : [place]  Updates made by Lane Hacker, RPH since 07/13/2021 12:00 AM     Problem: IBS, GERD, Hyperlipidemia   Priority: High  Onset Date: 11/08/2020     Long-Range Goal: disease management   Start Date: 11/08/2020  Expected End Date: 11/08/2021  Recent  Progress: On track  Priority: High  Note:   Current Barriers:  Unable to achieve control of cholesterol    Pharmacist Clinical Goal(s):  Over the next 90 days, patient will achieve control of cholesterol  as evidenced by lipid panel adhere to plan to optimize therapeutic regimen for cholesterol as evidenced by report of adherence to recommended medication management changes through collaboration with PharmD and provider.      Interventions: 1:1 collaboration with Rochel Brome, MD regarding development and update of comprehensive plan of care as evidenced by provider attestation and co-signature Inter-disciplinary care team collaboration (see longitudinal plan of care) Comprehensive medication review performed; medication list updated in electronic medical record  Hyperlipidemia: (LDL goal < 100) -uncontrolled -Current treatment: Crestor 10 mg twice a week - but has not started. Waiting to talk to cardiologist.  -Medications previously tried: red yeast rice  -Current dietary patterns: likes vegetables -Current exercise habits: walking her dog and walking at the beach.  -Educated on Cholesterol goals;  Benefits of statin for ASCVD risk reduction; Importance of limiting foods high in cholesterol; Exercise goal of 150 minutes per week; -Counseled on diet and exercise extensively  August 2022: Patient has not taken Crestor - wants to wait and talk to Dr. Otho Perl about Crestor before starting it. Discussed and recommended starting twice a week.  October 2022: Patient still hasn't started but agreed to start today. Also, has no f/u with PCP so she'll start today and schedule f/u for 4-6 weeks from now to  get labs  Osteoporosis / Osteopenia (Goal get updated Dexa Scan to assess bones) -uncertain control -Last DEXA Scan: scheduled for 12/2020 -Current treatment  Calcium citrate D bid - recommended patient begin due to PPI -Medications previously tried: calcium with D  -Recommend [phone] units of vitamin D daily. Recommend 1200 mg of calcium daily from dietary and supplemental sources. Recommend weight-bearing and muscle strengthening exercises for building and maintaining bone density. -Counseled on diet and exercise extensively Recommended Calicum citrate with D twice daily and updated Dexa scan.   Plan: At goal,  patient stable/ symptoms controlled   IBS-C (Goal: manage constipation and stomach cramping) -controlled -Current treatment  dicyclomine 10 mg tid Librax tid before meals -Medications previously tried: none reported  -Counseled on diet and exercise extensively Recommended to continue current medication. Patient reports occasional low belly pain that isn't sure of the origin. Reports that her mom died of cancer and she follows with OBGYN annually and encouraged patient to call for sooner appointment if has the cramping pain again.   GERD (Goal: symptom control/management) -uncontrolled -Current treatment  omeprazole 40 mg twice daily  -Medications previously tried: none reported  -Recommended to continue current medication Counseled on small meals, elevating head of bed and avoiding tight waistbands - Large hiatal hernia - has to use caution eating too much.   Pain  (Goal: manage pain) -controlled Pain Scale: October 2022: 0/10 -Current treatment  gabapentin 100 mg - 2 capsules at bedtime -Medications previously tried: none reported  -Counseled on diet and exercise extensively Recommended to continue current medication  Calling Dr. Tamala Julian to schedule epidural for pain relief.  October 2022: Stopped Gabapentin, told me she didn't need for pain anymore  Health  Maintenance -Vaccine gaps: none on file   -Patient is satisfied with current therapy and denies issues -Counseled on diet and exercise extensively Recommended to continue current medication   Patient Goals/Self-Care Activities Over the next 90 days, patient will:  - take medications as  prescribed  Follow Up Plan: Telephone follow up appointment with care management team member scheduled for: April 2023  [place] Constable, [place].D. - [phone]       Medication Assistance: None required.  Patient affirms current coverage meets needs.  Patient's preferred pharmacy is:  [id number] [place] [address] [address]Phone: [phone] Fax: [phone] Uses pill box? Yes Pt endorses 100% compliance  We discussed: Current pharmacy is preferred with insurance plan and patient is satisfied with pharmacy services Patient decided to: Continue current medication management strategy  Care Plan and Follow Up Patient Decision:  Patient agrees to Care Plan and Follow-up.  Plan: Telephone follow up appointment with care management team member scheduled for:  April 2023  NArizona Constable PFloridaD. -- [phone]

## 2021-07-15 NOTE — Progress Notes (Deleted)
[place] Phone: [phone] Subjective:    I'm seeing this patient by the request  of:  Rochel Brome, MD  CC:   [id number]  05/26/2021 Discussed with patient at great length.  Not responding to the second epidural.  He did not initially respond to the first 1.  Patient does have facet arthropathy as well that could be contributing to some of the discomfort but not giving her the radicular symptoms likely.  We discussed the potential for facet injections but would like to refer patient to neurosurgery to discuss other surgical options.  See if patient is a candidate at all.  Discussed with patient not knowing exactly where the true nerve impingement is at the moment may need further work-up first.  Patient is in agreement with the plan.  Encouraged her to try to do the gabapentin more regularly.  Total time with patient reviewing imaging as well as discussing with patient will 2 to 3 months otherwise  Update 07/26/2021 Patricia Leonard is a 70 y.o. female coming in with complaint of LBP. Patient was referred to Dr. Arnoldo Morale. Patient states       Past Medical History:  Diagnosis Date   Allergy    Anemia    Anxiety    Aortic atherosclerosis (HCC)    Arthritis    Cataract    Depression    Family history of colon cancer    Fibromyalgia    GERD (gastroesophageal reflux disease)    Hyperlipidemia    IBS (irritable bowel syndrome)    Rectal prolapse    Stomach ulcer    Tick bite    took antibiotics memorial day weekend 2021 on back   Past Surgical History:  Procedure Laterality Date   APPENDECTOMY     CESAREAN SECTION     CHOLECYSTECTOMY     COLONOSCOPY  04/18/2014   Diverticulosis.    ESOPHAGOGASTRODUODENOSCOPY  04/14/2016   Schatzkis ring. Hiatal hernia. Gastritis.    LIPOMA EXCISION     thermal ablation     TONSILLECTOMY     Social History   Socioeconomic History   Marital status: Widowed     Spouse name: Not on file   Number of children: Not on file   Years of education: Not on file   Highest education level: Not on file  Occupational History   Not on file  Tobacco Use   Smoking status: Never   Smokeless tobacco: Never  Vaping Use   Vaping Use: Never used  Substance and Sexual Activity   Alcohol use: Never   Drug use: Never   Sexual activity: Not on file  Other Topics Concern   Not on file  Social History Narrative   Not on file   Social Determinants of Health   Financial Resource Strain: Not on file  Food Insecurity: No Food Insecurity   Worried About Running Out of Food in the Last Year: Never true   [place] in the Last Year: Never true  Transportation Needs: No Transportation Needs   Lack of Transportation (Medical): No   Lack of Transportation (Non-Medical): No  Physical Activity: Not on file  Stress: Not on file  Social Connections: Not on file   Allergies  Allergen Reactions   Amoxicillin-Pot Clavulanate Nausea Only    Stomach pain    Iohexol Rash    HAD FACIAL RASH WITH CT CONTRAST IN 2006    Family History  Problem Relation Age of Onset   Colon cancer Mother    Liver cancer Father    CAD Father    Hypertension Sister    Colon cancer Sister    Hyperlipidemia Brother    Gout Brother    Diabetes Maternal Grandmother    Diabetes Paternal Grandmother    Heart attack Other    Cancer Other        Leiomyosarcome and Liver   Migraines Other    Cancer Maternal Aunt        "female cancer, not breast"   Cancer Paternal Aunt        "female cancer, not breast"   Colon polyps Neg Hx    Esophageal cancer Neg Hx    Pancreatic cancer Neg Hx    Rectal cancer Neg Hx    Stomach cancer Neg Hx      Current Outpatient Medications (Cardiovascular):    rosuvastatin (CRESTOR) 10 MG tablet, TAKE 1 TABLET BY MOUTH EVERY DAY (Patient not taking: Reported on 07/13/2021)  Current Outpatient Medications (Respiratory):    diphenhydrAMINE (BENADRYL)  50 MG tablet, Take 1 tablet (50 mg total) by mouth once for 1 dose.   Current Outpatient Medications (Hematological):    cyanocobalamin 1000 MCG tablet, Take by mouth.  Current Outpatient Medications (Other):    Biotin 10 MG CAPS, Take by mouth.   calcium citrate-vitamin D (CITRACAL+D) 315-200 MG-UNIT tablet, Take 1 tablet by mouth 2 (two) times daily. (Patient not taking: Reported on 07/13/2021)   calcium-vitamin D (OSCAL WITH D) 500-200 MG-UNIT TABS tablet, Take by mouth.   clotrimazole-betamethasone (LOTRISONE) cream, Apply topically.   cyclobenzaprine (FLEXERIL) 5 MG tablet, Take 1 tablet (5 mg total) by mouth 3 (three) times daily as needed for muscle spasms.   dicyclomine (BENTYL) 10 MG capsule, Take 1 capsule (10 mg total) by mouth 3 (three) times daily before meals. As needed   gabapentin (NEURONTIN) 100 MG capsule, Take 2 capsules (200 mg total) by mouth at bedtime. (Patient not taking: Reported on 07/13/2021)   ketoconazole (NIZORAL) 2 % cream, Apply to lower abdominal rash   magnesium hydroxide (MILK OF MAGNESIA) 400 MG/5ML suspension, Take 15 mLs by mouth daily as needed for mild constipation.   omeprazole (PRILOSEC) 40 MG capsule, TAKE 1 CAPSULE (40 MG TOTAL) BY MOUTH IN THE MORNING AND AT BEDTIME.   pyridoxine (B-6) 100 MG tablet, Take by mouth.   triamcinolone cream (KENALOG) 0.1 %, Apply to right ear rash twice daily   Reviewed prior external information including notes and imaging from  primary care provider As well as notes that were available from care everywhere and other healthcare systems.  Past medical history, social, surgical and family history all reviewed in electronic medical record.  No pertanent information unless stated regarding to the chief complaint.   Review of Systems:  No headache, visual changes, nausea, vomiting, diarrhea, constipation, dizziness, abdominal pain, skin rash, fevers, chills, night sweats, weight loss, swollen lymph nodes, body aches,  joint swelling, chest pain, shortness of breath, mood changes. POSITIVE muscle aches  Objective  There were no vitals taken for this visit.   General: No apparent distress alert and oriented x3 mood and affect normal, dressed appropriately.  HEENT: Pupils equal, extraocular movements intact  Respiratory: Patient's speak in full sentences and does not appear short of breath  Cardiovascular: No lower extremity edema, non tender, no erythema  Gait normal with good balance and coordination.  MSK:  Non tender with full range of motion  and good stability and symmetric strength and tone of shoulders, elbows, wrist, hip, knee and ankles bilaterally.     Impression and Recommendations:     The above documentation has been reviewed and is accurate and complete Jacqualin Combes

## 2021-07-16 DIAGNOSIS — J398 Other specified diseases of upper respiratory tract: Secondary | ICD-10-CM | POA: Diagnosis not present

## 2021-07-16 DIAGNOSIS — Z20822 Contact with and (suspected) exposure to covid-19: Secondary | ICD-10-CM | POA: Diagnosis not present

## 2021-07-16 DIAGNOSIS — J029 Acute pharyngitis, unspecified: Secondary | ICD-10-CM | POA: Diagnosis not present

## 2021-07-25 ENCOUNTER — Other Ambulatory Visit: Payer: Self-pay | Admitting: Family Medicine

## 2021-07-25 DIAGNOSIS — E782 Mixed hyperlipidemia: Secondary | ICD-10-CM

## 2021-07-25 DIAGNOSIS — M810 Age-related osteoporosis without current pathological fracture: Secondary | ICD-10-CM | POA: Diagnosis not present

## 2021-07-25 MED ORDER — ROSUVASTATIN CALCIUM 10 MG PO TABS: ORAL_TABLET | ORAL | 0 refills | Status: DC

## 2021-07-26 ENCOUNTER — Ambulatory Visit: Payer: PPO | Admitting: Family Medicine

## 2021-07-27 DIAGNOSIS — R0789 Other chest pain: Secondary | ICD-10-CM | POA: Diagnosis not present

## 2021-07-30 IMAGING — CT CT ABD-PELV W/O CM
2 of 6 series · 16 of 46 positions shown, 18 images · non-contrast
Comparison: 06/26/2015 abdominal ultrasound from [facility].
03/26/2014 CT from [facility].

CLINICAL DATA: Right-sided abdominal/flank pain. Epigastric pain.
Early satiety. Cholecystectomy appendectomy.

EXAM:
CT ABDOMEN AND PELVIS WITHOUT CONTRAST
TECHNIQUE: Multidetector CT imaging of the abdomen and pelvis was performed
following the standard protocol without IV contrast.

[Series 2: abd/ pelvis · axial · 0.75mm/px · z∈[-494,-94]mm · 13 of 92 slices shown, 15 images]
[im 6/92  soft-tissue]
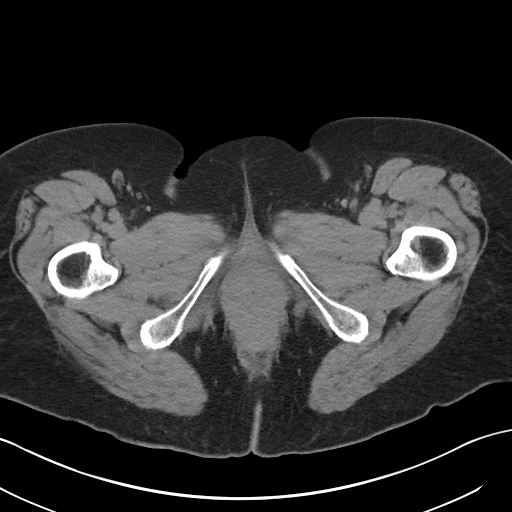
[im 6/92  bone]
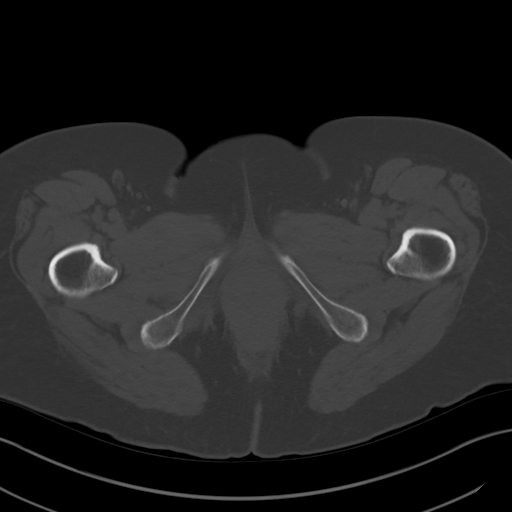
[im 11/92  soft-tissue]
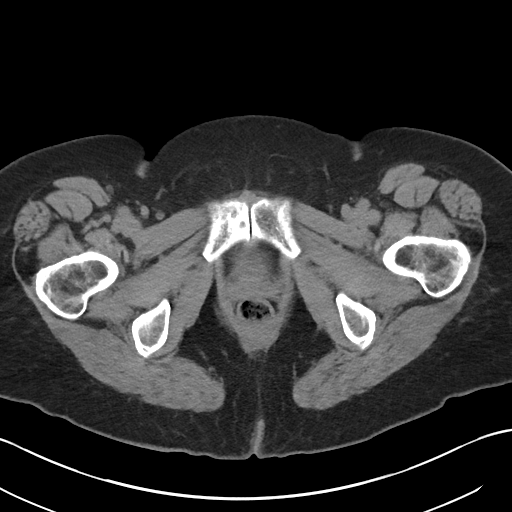
[im 21/92  soft-tissue]
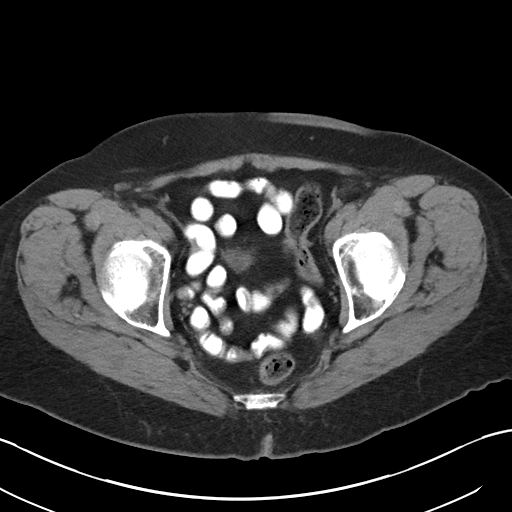
[im 26/92  soft-tissue]
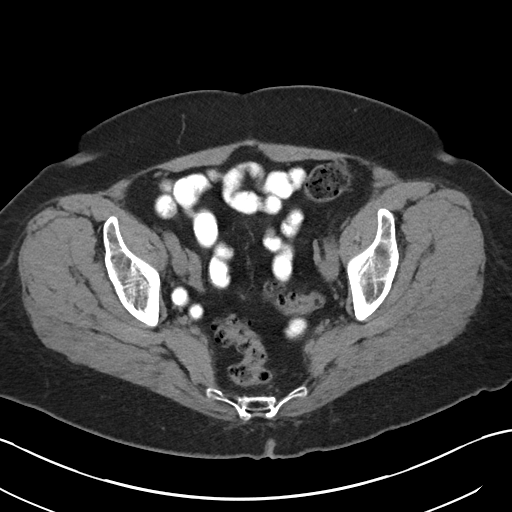
[im 31/92  soft-tissue]
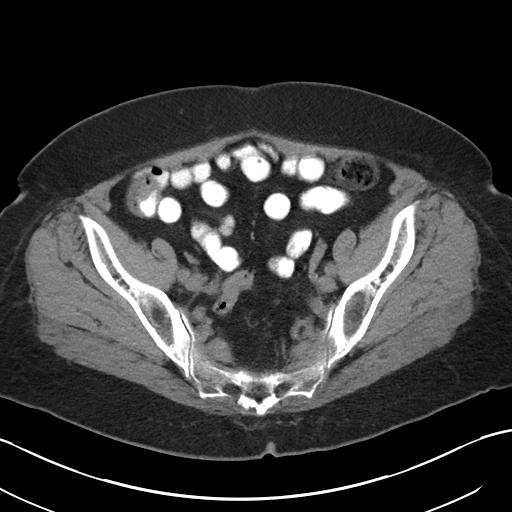
[im 41/92  soft-tissue]
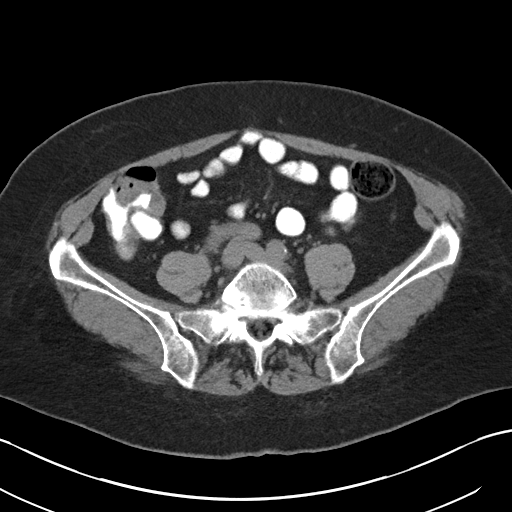
[im 46/92  soft-tissue]
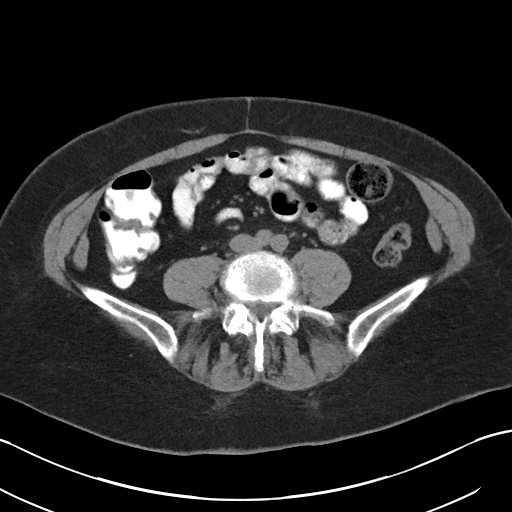
[im 51/92  soft-tissue]
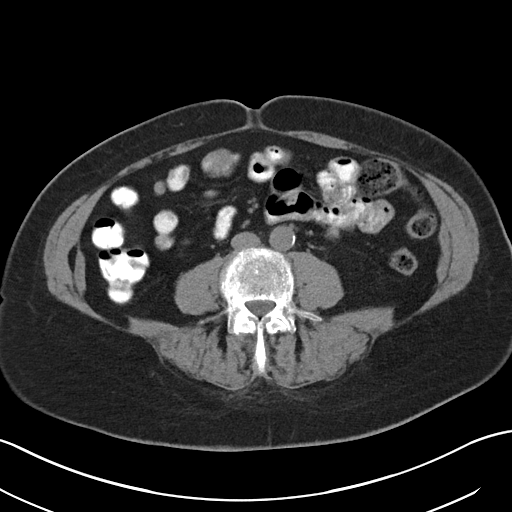
[im 61/92  soft-tissue]
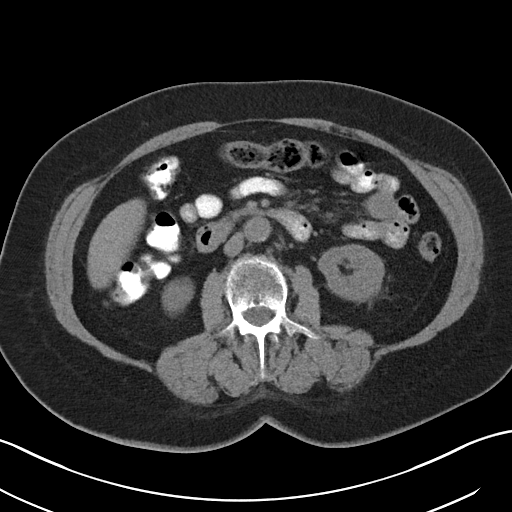
[im 61/92  bone]
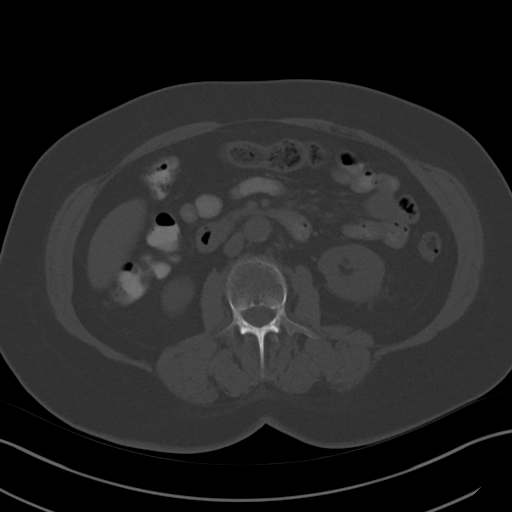
[im 66/92  soft-tissue]
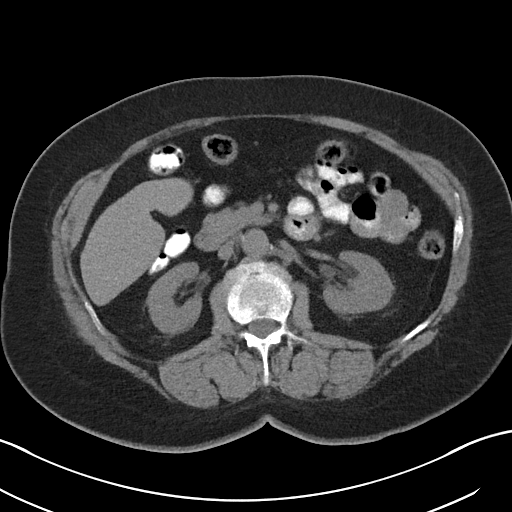
[im 71/92  soft-tissue]
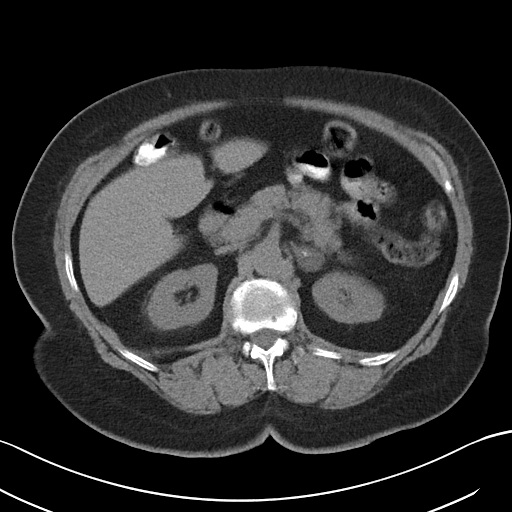
[im 81/92  soft-tissue]
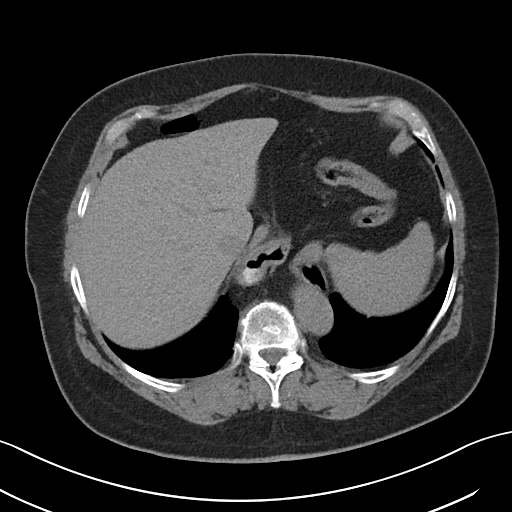
[im 86/92  soft-tissue]
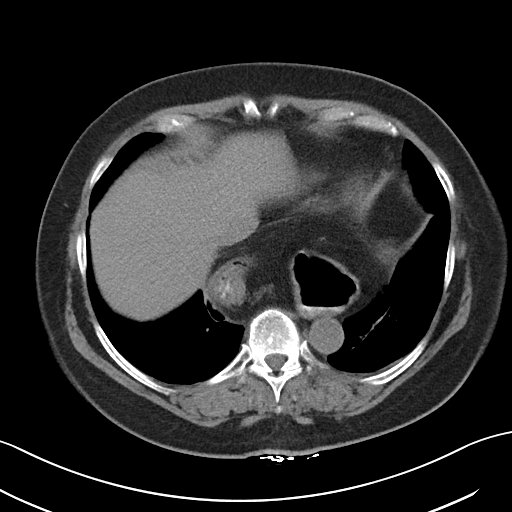

[Series 5: cor st · coronal · 0.69mm/px · 3 of 75 slices shown]
[im 25/75  soft-tissue]
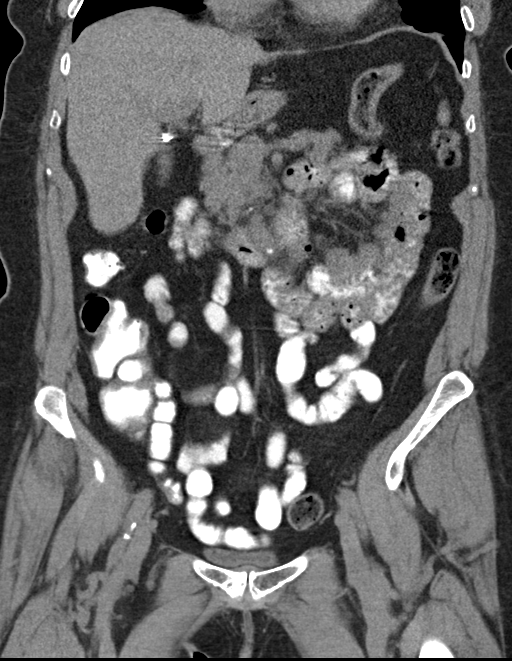
[im 33/75  soft-tissue]
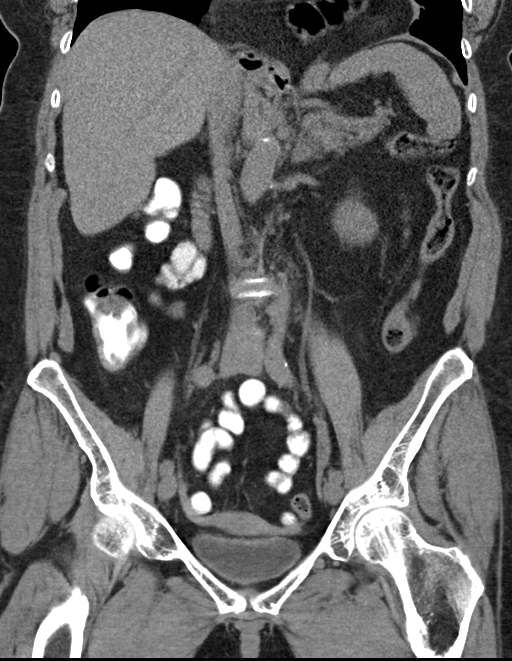
[im 42/75  soft-tissue]
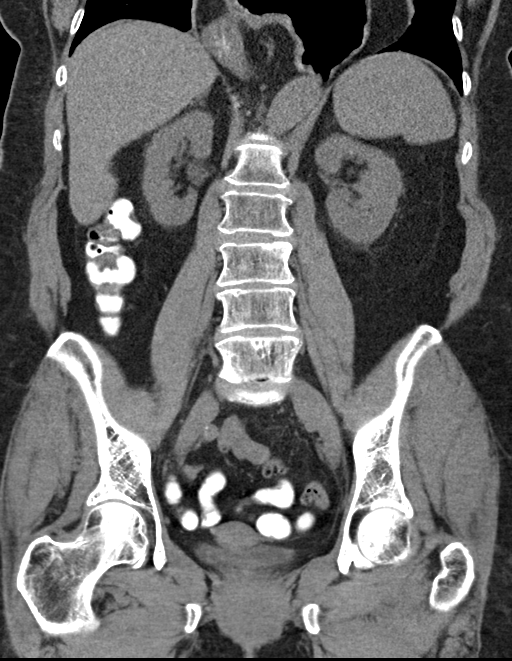

[16 of 46 positions shown; findings below may reference images not displayed]

FINDINGS: Lower chest: Bibasilar scarring. Mild cardiomegaly, without
pericardial or pleural effusion. Minimal enlargement of a large
hiatal hernia, with [DATE] of the stomach positioned in the chest.

Hepatobiliary: Normal noncontrast appearance of the liver.
Cholecystectomy, without biliary ductal dilatation.

Pancreas: Normal, without mass or ductal dilatation.

Spleen: Normal in size, without focal abnormality.

Adrenals/Urinary Tract: Normal right adrenal gland. Left adrenal
cm nodule is similar in size 03/26/2014, consistent with an adenoma.
No renal calculi or hydronephrosis. No hydroureter or ureteric
calculi. No bladder calculi.

Stomach/Bowel: Large hiatal hernia. Normal distal most stomach.
Colonic stool burden suggests constipation. Scattered colonic
diverticula. Soft tissue fullness about the cecum, including just
anterior to the ileocecal junction on 53/2. Also coronal image [DATE].
Normal small bowel.

Vascular/Lymphatic: Aortic atherosclerosis. No abdominopelvic
adenopathy.

Reproductive: Normal uterus and adnexa.

Other: No significant free fluid.  Mild pelvic floor laxity.

Musculoskeletal: Osteopenia. Degenerate disc disease at the
lumbosacral junction.
IMPRESSION: 1.  No acute process or explanation for right-sided abdominal pain.
2. Enlargement of a large hiatal hernia.
3.  Possible constipation.
4. Soft tissue fullness within the cecum, at the level of the
ileocecal junction. Correlate with colon cancer screening history.
If not up-to-date, consider colonoscopy with attention to this area.
5. Pelvic floor laxity.
6.  Aortic Atherosclerosis ([id number]).
7. Left adrenal adenoma.

These results will be called to the ordering clinician or
representative by the Radiologist Assistant, and communication
documented in the PACS or [REDACTED].

## 2021-08-04 NOTE — Progress Notes (Signed)
Subjective:  Patient ID: Patricia Leonard, female    DOB: 20-Mar-1951  Age: 70 y.o. MRN: [id number]  Chief Complaint  Patient presents with   Hyperlipidemia   Gastroesophageal Reflux   Hyperlipidemia: Current Medications: Rosuvastatin 10mg  daily. Pt reported she had not taken it at all.   GERD: Current Medications: Omeprazole 40mg  1 tablet BID  Right flank/side of abdominal pain x 4 weeks .  Also a gnawing epigastric pain. Food helps with epigastric pain. History of ulcers.  Taking dicyclomine with very little relief. Pt has a hiatal hernia and is miserable when flares up.   B12 - taking b12 supplement.  Migraines infrequently. OSA: Used to use cpap, but she did not tolerate use. It has been greater than 5 yrs since she has had Sleep study.  Cardiology: sees Dr Otho Perl. Had a normal stress test last week.  Pt is very anxious and depressed. Has tried xanax previously.  PHQ9 SCORE ONLY 08/05/2021 06/27/2021 01/19/2021  PHQ-9 Total Score 14 0 0     Current Outpatient Medications on File Prior to Visit  Medication Sig Dispense Refill   diphenhydrAMINE (BENADRYL) 50 MG tablet Take 1 tablet (50 mg total) by mouth once for 1 dose. 1 tablet 0   Biotin 10 MG CAPS Take by mouth.     calcium-vitamin D (OSCAL WITH D) 500-200 MG-UNIT TABS tablet Take by mouth.     clotrimazole-betamethasone (LOTRISONE) cream Apply topically.     cyanocobalamin 1000 MCG tablet Take by mouth.     cyclobenzaprine (FLEXERIL) 5 MG tablet Take 1 tablet (5 mg total) by mouth 3 (three) times daily as needed for muscle spasms. 30 tablet 3   dicyclomine (BENTYL) 10 MG capsule Take 1 capsule (10 mg total) by mouth 3 (three) times daily before meals. As needed 90 capsule 8   ketoconazole (NIZORAL) 2 % cream Apply to lower abdominal rash 15 g 0   magnesium hydroxide (MILK OF MAGNESIA) 400 MG/5ML suspension Take 15 mLs by mouth daily as needed for mild constipation.     pyridoxine (B-6) 100 MG tablet Take by mouth.      rosuvastatin (CRESTOR) 10 MG tablet One twice a week (Patient not taking: Reported on 08/31/2021) 24 tablet 0   triamcinolone cream (KENALOG) 0.1 % Apply to right ear rash twice daily 30 g 0   No current facility-administered medications on file prior to visit.   Past Medical History:  Diagnosis Date   Allergy    Anemia    Anxiety    Aortic atherosclerosis (HCC)    Arthritis    Cataract    Depression    Family history of colon cancer    Fibromyalgia    GERD (gastroesophageal reflux disease)    Hyperlipidemia    IBS (irritable bowel syndrome)    Rectal prolapse    Stomach ulcer    Tick bite    took antibiotics memorial day weekend 2021 on back   Past Surgical History:  Procedure Laterality Date   APPENDECTOMY     CESAREAN SECTION     CHOLECYSTECTOMY     COLONOSCOPY  04/18/2014   Diverticulosis.    ESOPHAGOGASTRODUODENOSCOPY  04/14/2016   Schatzkis ring. Hiatal hernia. Gastritis.    LIPOMA EXCISION     thermal ablation     TONSILLECTOMY      Family History  Problem Relation Age of Onset   Colon cancer Mother    Liver cancer Father    CAD Father    Hypertension  Sister    Colon cancer Sister    Hyperlipidemia Brother    Gout Brother    Diabetes Maternal Grandmother    Diabetes Paternal Grandmother    Heart attack Other    Cancer Other        Leiomyosarcome and Liver   Migraines Other    Cancer Maternal Aunt        "female cancer, not breast"   Cancer Paternal Aunt        "female cancer, not breast"   Colon polyps Neg Hx    Esophageal cancer Neg Hx    Pancreatic cancer Neg Hx    Rectal cancer Neg Hx    Stomach cancer Neg Hx    Social History   Socioeconomic History   Marital status: Widowed    Spouse name: Not on file   Number of children: Not on file   Years of education: Not on file   Highest education level: Not on file  Occupational History   Not on file  Tobacco Use   Smoking status: Never   Smokeless tobacco: Never  Vaping Use   Vaping Use:  Never used  Substance and Sexual Activity   Alcohol use: Never   Drug use: Never   Sexual activity: Not on file  Other Topics Concern   Not on file  Social History Narrative   Not on file   Social Determinants of Health   Financial Resource Strain: Not on file  Food Insecurity: No Food Insecurity   Worried About Running Out of Food in the Last Year: Never true   [place] in the Last Year: Never true  Transportation Needs: No Transportation Needs   Lack of Transportation (Medical): No   Lack of Transportation (Non-Medical): No  Physical Activity: Not on file  Stress: Not on file  Social Connections: Not on file    Review of Systems  Constitutional:  Positive for fatigue. Negative for appetite change and fever.  HENT:  Positive for postnasal drip. Negative for congestion, ear pain, sinus pressure and sore throat.   Eyes:  Negative for pain.  Respiratory:  Negative for cough, chest tightness, shortness of breath and wheezing.   Cardiovascular:  Negative for chest pain and palpitations.  Gastrointestinal:  Positive for abdominal pain and constipation. Negative for diarrhea, nausea and vomiting.  Genitourinary:  Negative for dysuria and hematuria.  Musculoskeletal:  Positive for back pain and myalgias. Negative for arthralgias and joint swelling.  Skin:  Negative for rash.  Neurological:  Negative for dizziness, weakness and headaches.  Psychiatric/Behavioral:  Positive for dysphoric mood. The patient is nervous/anxious.     Objective:  BP 128/74   Pulse 80   Temp 97.6 F (36.4 C)   Resp 14   Wt 226 lb (102.5 kg)   BMI 35.93 kg/m   BP/Weight 08/31/2021 08/05/2021 14/43/1540  Systolic BP 086 761 950  Diastolic BP 94 74 68  Wt. (Lbs) 226.13 226 229  BMI 35.95 35.93 36.41    Physical Exam Vitals reviewed.  Constitutional:      Appearance: Normal appearance. She is obese.  HENT:     Right Ear: Tympanic membrane normal.     Nose: Rhinorrhea present.      Mouth/Throat:     Mouth: Mucous membranes are moist.  Neck:     Vascular: No carotid bruit.  Cardiovascular:     Rate and Rhythm: Normal rate and regular rhythm.     Heart sounds: Normal heart sounds.  Pulmonary:     Effort: Pulmonary effort is normal. No respiratory distress.     Breath sounds: Normal breath sounds.  Abdominal:     General: Abdomen is flat. Bowel sounds are normal.     Palpations: Abdomen is soft.     Tenderness: There is no abdominal tenderness (epigastric, to the rt of her umbilicus and over rt inguinal region. No hernias obvious.).  Lymphadenopathy:     Cervical: Cervical adenopathy (mildly tender rt side of neck (lymph node)) present.  Neurological:     Mental Status: She is alert and oriented to person, place, and time.  Psychiatric:        Mood and Affect: Mood normal.        Behavior: Behavior normal.    Diabetic Foot Exam - Simple   No data filed      Lab Results  Component Value Date   WBC 4.1 08/05/2021   HGB 14.6 08/05/2021   HCT 43.8 08/05/2021   PLT 201 08/05/2021   GLUCOSE 93 08/05/2021   CHOL 211 (H) 08/05/2021   TRIG 94 08/05/2021   HDL 58 08/05/2021   LDLCALC 136 (H) 08/05/2021   ALT 15 08/05/2021   AST 19 08/05/2021   NA 142 08/05/2021   K 4.3 08/05/2021   CL 103 08/05/2021   CREATININE 0.74 08/05/2021   BUN 15 08/05/2021   CO2 24 08/05/2021   TSH 2.910 08/05/2021      Assessment & Plan:   Problem List Items Addressed This Visit       Cardiovascular and Mediastinum   Aortic atherosclerosis ([place])    Check FLP.  Recommend start on crestor 10 mg once daily.  Recommend continue to work on eating healthy diet and exercise.         Respiratory   OSA (obstructive sleep apnea)    Recommend home sleep study to reassess OSA.      Relevant Orders   Home sleep test     Digestive   Irritable bowel syndrome with constipation    Continue bentyl.       GERD (gastroesophageal reflux disease)     Musculoskeletal and  Integument   Osteopenia    Recommend calcium with D 1200 mg daily.         Other   Mixed hyperlipidemia - Primary    Not at goal  Recommend start crestor 10 mg once daily at night.  Continue to work on eating a healthy diet and exercise.  Labs drawn today.        Relevant Orders   CBC with Differential/Platelet (Completed)   Comprehensive metabolic panel (Completed)   Lipid panel (Completed)   TSH (Completed)   Epigastric pain    Check labs.  Check h. Pylori ab.      Relevant Orders   HELICOBACTER PYLORI  ANTIBODY, IGM (Completed)   Moderate recurrent major depression ([place])    Start on citalopram 10 mg daily.      Relevant Medications   citalopram (CELEXA) 10 MG tablet   Dysuria    Check UA. Normal.       Relevant Orders   POCT urinalysis dipstick (Completed)   Other Visit Diagnoses     Need for influenza vaccination       Relevant Orders   Flu Vaccine QUAD High Dose(Fluad) (Completed)     .  Meds ordered this encounter  Medications   citalopram (CELEXA) 10 MG tablet    Sig: Take 1 tablet (10 mg  total) by mouth daily.    Dispense:  30 tablet    Refill:  3    Orders Placed This Encounter  Procedures   Flu Vaccine QUAD High Dose(Fluad)   CBC with Differential/Platelet   Comprehensive metabolic panel   Lipid panel   TSH   HELICOBACTER PYLORI  ANTIBODY, IGM   POCT urinalysis dipstick   Home sleep test     Follow-up: Return in about 4 weeks (around 09/02/2021).  An After Visit Summary was printed and given to the patient.   I,Lauren M Auman,acting as a scribe for Rochel Brome, MD.,have documented all relevant documentation on the behalf of Rochel Brome, MD,as directed by  Rochel Brome, MD while in the presence of Rochel Brome, MD.    Rochel Brome, MD [place] [phone]

## 2021-08-05 ENCOUNTER — Encounter: Payer: Self-pay | Admitting: Family Medicine

## 2021-08-05 ENCOUNTER — Telehealth: Payer: Self-pay

## 2021-08-05 ENCOUNTER — Ambulatory Visit (INDEPENDENT_AMBULATORY_CARE_PROVIDER_SITE_OTHER): Payer: PPO | Admitting: Family Medicine

## 2021-08-05 ENCOUNTER — Other Ambulatory Visit: Payer: Self-pay

## 2021-08-05 VITALS — BP 128/74 | HR 80 | Temp 97.6°F | Resp 14 | Wt 226.0 lb

## 2021-08-05 DIAGNOSIS — G4733 Obstructive sleep apnea (adult) (pediatric): Secondary | ICD-10-CM | POA: Diagnosis not present

## 2021-08-05 DIAGNOSIS — R1013 Epigastric pain: Secondary | ICD-10-CM | POA: Diagnosis not present

## 2021-08-05 DIAGNOSIS — E782 Mixed hyperlipidemia: Secondary | ICD-10-CM

## 2021-08-05 DIAGNOSIS — H40053 Ocular hypertension, bilateral: Secondary | ICD-10-CM | POA: Diagnosis not present

## 2021-08-05 DIAGNOSIS — I700 Atherosclerosis of aorta: Secondary | ICD-10-CM

## 2021-08-05 DIAGNOSIS — K581 Irritable bowel syndrome with constipation: Secondary | ICD-10-CM | POA: Diagnosis not present

## 2021-08-05 DIAGNOSIS — M810 Age-related osteoporosis without current pathological fracture: Secondary | ICD-10-CM | POA: Insufficient documentation

## 2021-08-05 DIAGNOSIS — Z23 Encounter for immunization: Secondary | ICD-10-CM

## 2021-08-05 DIAGNOSIS — K219 Gastro-esophageal reflux disease without esophagitis: Secondary | ICD-10-CM

## 2021-08-05 DIAGNOSIS — M8588 Other specified disorders of bone density and structure, other site: Secondary | ICD-10-CM | POA: Diagnosis not present

## 2021-08-05 DIAGNOSIS — F331 Major depressive disorder, recurrent, moderate: Secondary | ICD-10-CM

## 2021-08-05 DIAGNOSIS — R300 Dysuria: Secondary | ICD-10-CM | POA: Diagnosis not present

## 2021-08-05 DIAGNOSIS — M8580 Other specified disorders of bone density and structure, unspecified site: Secondary | ICD-10-CM | POA: Insufficient documentation

## 2021-08-05 HISTORY — DX: Major depressive disorder, recurrent, moderate: F33.1

## 2021-08-05 LAB — POCT URINALYSIS DIPSTICK
Bilirubin, UA: NEGATIVE
Blood, UA: NEGATIVE
Glucose, UA: NEGATIVE
Ketones, UA: NEGATIVE
Leukocytes, UA: NEGATIVE
Nitrite, UA: NEGATIVE
Protein, UA: POSITIVE — AB
Spec Grav, UA: 1.01 (ref 1.010–1.025)
Urobilinogen, UA: 1 E.U./dL
pH, UA: 7.5 (ref 5.0–8.0)

## 2021-08-05 MED ORDER — CITALOPRAM HYDROBROMIDE 10 MG PO TABS: 10.0000 mg | ORAL_TABLET | Freq: Every day | ORAL | 3 refills | Status: DC

## 2021-08-05 NOTE — Telephone Encounter (Signed)
Left an additional message for pt to call back

## 2021-08-05 NOTE — Telephone Encounter (Signed)
Reached out to pt after receiving a message from Dr. Tobie Poet and Dr. Lyndel Safe concerning pt having Right side abdominal pain along with Epigastric pain. Left Message for pt to call back on home phone and Cell Phone.

## 2021-08-08 LAB — COMPREHENSIVE METABOLIC PANEL
ALT: 15 IU/L (ref 0–32)
AST: 19 IU/L (ref 0–40)
Albumin/Globulin Ratio: 1.9 (ref 1.2–2.2)
Albumin: 4.4 g/dL (ref 3.7–4.7)
Alkaline Phosphatase: 94 IU/L (ref 44–121)
BUN/Creatinine Ratio: 20 (ref 12–28)
BUN: 15 mg/dL (ref 8–27)
Bilirubin Total: 0.7 mg/dL (ref 0.0–1.2)
CO2: 24 mmol/L (ref 20–29)
Calcium: 9.2 mg/dL (ref 8.7–10.3)
Chloride: 103 mmol/L (ref 96–106)
Creatinine, Ser: 0.74 mg/dL (ref 0.57–1.00)
Globulin, Total: 2.3 g/dL (ref 1.5–4.5)
Glucose: 93 mg/dL (ref 70–99)
Potassium: 4.3 mmol/L (ref 3.5–5.2)
Sodium: 142 mmol/L (ref 134–144)
Total Protein: 6.7 g/dL (ref 6.0–8.5)
eGFR: 86 mL/min/{1.73_m2} (ref >59)

## 2021-08-08 LAB — CBC WITH DIFFERENTIAL/PLATELET
Basophils Absolute: 0 10*3/uL (ref 0.0–0.2)
Basos: 1 %
EOS (ABSOLUTE): 0.1 10*3/uL (ref 0.0–0.4)
Eos: 3 %
Hematocrit: 43.8 % (ref 34.0–46.6)
Hemoglobin: 14.6 g/dL (ref 11.1–15.9)
Immature Grans (Abs): 0 10*3/uL (ref 0.0–0.1)
Immature Granulocytes: 0 %
Lymphocytes Absolute: 1.2 10*3/uL (ref 0.7–3.1)
Lymphs: 29 %
MCH: 29.7 pg (ref 26.6–33.0)
MCHC: 33.3 g/dL (ref 31.5–35.7)
MCV: 89 fL (ref 79–97)
Monocytes Absolute: 0.3 10*3/uL (ref 0.1–0.9)
Monocytes: 7 %
Neutrophils Absolute: 2.4 10*3/uL (ref 1.4–7.0)
Neutrophils: 60 %
Platelets: 201 10*3/uL (ref 150–450)
RBC: 4.91 x10E6/uL (ref 3.77–5.28)
RDW: 12.4 % (ref 11.7–15.4)
WBC: 4.1 10*3/uL (ref 3.4–10.8)

## 2021-08-08 LAB — TSH: TSH: 2.91 u[IU]/mL (ref 0.450–4.500)

## 2021-08-08 LAB — HELICOBACTER PYLORI  ANTIBODY, IGM: H pylori, IgM Abs: <9 units (ref 0.0–8.9)

## 2021-08-08 LAB — LIPID PANEL
Chol/HDL Ratio: 3.6 ratio (ref 0.0–4.4)
Cholesterol, Total: 211 mg/dL — ABNORMAL HIGH (ref 100–199)
HDL: 58 mg/dL (ref >39)
LDL Chol Calc (NIH): 136 mg/dL — ABNORMAL HIGH (ref 0–99)
Triglycerides: 94 mg/dL (ref 0–149)
VLDL Cholesterol Cal: 17 mg/dL (ref 5–40)

## 2021-08-08 NOTE — Telephone Encounter (Signed)
LVM requesting returned call to inquire further about symptoms.

## 2021-08-09 NOTE — Telephone Encounter (Signed)
Pt stated that she contacted our office yesterday and has scheduled an appointment with Dr. Lyndel Safe in two weeks.  Pt states that she had labs done through her PCP.

## 2021-08-26 ENCOUNTER — Ambulatory Visit: Payer: PPO | Admitting: Gastroenterology

## 2021-08-29 ENCOUNTER — Telehealth: Payer: Self-pay

## 2021-08-29 NOTE — Chronic Care Management (AMB) (Signed)
    Chronic Care Management Pharmacy Assistant   Name: Patricia Leonard  MRN: [id number] DOB: 01/12/1951   Reason for Encounter: General Adherence Call    Recent office visits:  08/05/21 Rochel Brome MD. Seen for HLD, GERD. Started Citalopram Hydrobromide 10 mg daily.  07/25/21 Orders Only. Cox, Kirsten MD. D/C Gabapentin 100 mg and Calcium Citrate- Vitamin D 315-200 mg. Reordered Rosuvastatin 10 mg twice a week.   Recent consult visits:  08/05/21 (Optics) [place], [place] OD. Seen for Borderline Glaucoma. No med changes.   07/27/21 (Cardiology) Mar Daring MD. Seen for Chest Discomfort. Performed a stress test. No med changes.   Hospital visits:  None   Medications: Outpatient Encounter Medications as of 08/29/2021  Medication Sig   diphenhydrAMINE (BENADRYL) 50 MG tablet Take 1 tablet (50 mg total) by mouth once for 1 dose.   Biotin 10 MG CAPS Take by mouth.   calcium-vitamin D (OSCAL WITH D) 500-200 MG-UNIT TABS tablet Take by mouth.   citalopram (CELEXA) 10 MG tablet Take 1 tablet (10 mg total) by mouth daily.   clotrimazole-betamethasone (LOTRISONE) cream Apply topically.   cyanocobalamin 1000 MCG tablet Take by mouth.   cyclobenzaprine (FLEXERIL) 5 MG tablet Take 1 tablet (5 mg total) by mouth 3 (three) times daily as needed for muscle spasms.   dicyclomine (BENTYL) 10 MG capsule Take 1 capsule (10 mg total) by mouth 3 (three) times daily before meals. As needed   ketoconazole (NIZORAL) 2 % cream Apply to lower abdominal rash   magnesium hydroxide (MILK OF MAGNESIA) 400 MG/5ML suspension Take 15 mLs by mouth daily as needed for mild constipation.   omeprazole (PRILOSEC) 40 MG capsule TAKE 1 CAPSULE (40 MG TOTAL) BY MOUTH IN THE MORNING AND AT BEDTIME.   pyridoxine (B-6) 100 MG tablet Take by mouth.   rosuvastatin (CRESTOR) 10 MG tablet One twice a week   triamcinolone cream (KENALOG) 0.1 % Apply to right ear rash twice daily   No facility-administered encounter  medications on file as of 08/29/2021.   [place] for general disease state and medication adherence call.   Patient is not > 5 days past due for refill on the following medications per chart history:  [facility] Medications: Medication Name/mg Last Fill Days Supply Rosuvastatin 10 mg  07/25/21 84ds     05/07/21 90ds  What concerns do you have about your medications? Pt is holding her Citalopram and Rosuvastatin because she is having side pain and is going to see an Copywriter, advertising tomorrow to see about her liver and wants to hold off until she talks to her doctor.   The patient denies side effects with her medications.   How often do you forget or accidentally miss a dose? Never  Do you use a pillbox? No  Are you having any problems getting your medications from your pharmacy? No  Has the cost of your medications been a concern? No  Since last visit with CPP, no interventions have been made:   The patient has not had an ED visit since last contact.   The patient denies problems with their health   Future Appointments  Date Time Provider [place]  08/31/2021  1:30 PM Jackquline Denmark, MD [facility]  09/01/2021  1:30 PM Rochel Brome, MD [facility] None     Care Gaps: Last annual wellness visit: 01/19/21 Mammogram: 01/10/21 Colonoscopy:04/03/18 Dexa Scan: 01/10/21   Elray Mcgregor, [place] Clinical Pharmacist Assistant  [phone]

## 2021-08-31 ENCOUNTER — Ambulatory Visit (INDEPENDENT_AMBULATORY_CARE_PROVIDER_SITE_OTHER): Payer: PPO | Admitting: Gastroenterology

## 2021-08-31 ENCOUNTER — Other Ambulatory Visit: Payer: Self-pay

## 2021-08-31 ENCOUNTER — Encounter: Payer: Self-pay | Admitting: Gastroenterology

## 2021-08-31 VITALS — BP 128/94 | HR 77 | Ht 66.5 in | Wt 226.1 lb

## 2021-08-31 DIAGNOSIS — K449 Diaphragmatic hernia without obstruction or gangrene: Secondary | ICD-10-CM

## 2021-08-31 DIAGNOSIS — Z86010 Personal history of colonic polyps: Secondary | ICD-10-CM

## 2021-08-31 DIAGNOSIS — Z800 Family history of malignant neoplasm of digestive organs: Secondary | ICD-10-CM | POA: Diagnosis not present

## 2021-08-31 DIAGNOSIS — K581 Irritable bowel syndrome with constipation: Secondary | ICD-10-CM

## 2021-08-31 DIAGNOSIS — K219 Gastro-esophageal reflux disease without esophagitis: Secondary | ICD-10-CM

## 2021-08-31 MED ORDER — NA SULFATE-K SULFATE-MG SULF 17.5-3.13-1.6 GM/177ML PO SOLN: 1.0000 | Freq: Once | ORAL | 0 refills | Status: AC

## 2021-08-31 MED ORDER — OMEPRAZOLE 40 MG PO CPDR: 40.0000 mg | DELAYED_RELEASE_CAPSULE | Freq: Two times a day (BID) | ORAL | 2 refills | Status: DC

## 2021-08-31 NOTE — Progress Notes (Signed)
IMPRESSION and PLAN:    #1.  GERD with large HH, S/P chole in past #2.  H/O tubular adenoma on colon 05/2018, FH colon cancer. #3.  IBS-C. Failed linzess. Has rectal prolapse/pelvic floor laxity/pelvic floor dyssynergia (never had ano-rectal manometry). No Sx per Dr Marcello Moores.  Plan: -EGD/Colon with 2 day prep (feb/march) -Increased omeprazole 40mg  po bid #60 -Miralax 17g po bid until large bowel movement, then once a day. -Continue Mg 400 QD -Kegel's exercises. -Pl start taking celexa as prescribed by Dr Tobie Poet -RTC 12 weeks.  -If still with problems, CT AP with contast (after contrast desentiziation)       HPI:    Chief Complaint:   Patricia Leonard is a 70 y.o. female  For follow-up visit. Continues to have multiple GI problems  -Upper GI including early satiety, intermittent heartburn without any significant nausea/vomiting/odynophagia/dysphagia.  She does have some upper abdominal discomfort after eating. Underwent EGD followed by CT Abdo/pelvis which showed hiatal hernia.  There was some suggestion of enlarging hiatal hernia on CT.  Neg cardiac stress test 07/27/2021 (Dr Otho Perl). Nl CBC, CMP, TSH, HP IgM 07/2021  -Lower GI problems including more constipation lately with change in stool caliber.  Her CT Abdo/pelvis showed questionable soft tissue density in the cecum/ascending colon.  However, colonoscopy with random colonic biopsies 03/2018 was neg for any masses.  She continues to struggle with abdominal bloating, lower abdominal discomfort which gets better with defecation.  She could not tolerate Linzess.  Wants to do more natural way rather than taking medications.  No recent weight loss or loss of appetite.  Seen by Dr Shela Leff (Sx)- no rectal prolapse.  Wt Readings from Last 3 Encounters:  08/31/21 226 lb 2 oz (102.6 kg)  07/04/21 229 lb (103.9 kg)  06/27/21 231 lb (104.8 kg)       SH-significant associated situational anxiety.  Unfortunately her husband  passed away.  She does feel alone.   Past GI procedures: -UGI 02/2020: Large hiatal hernia, with approximately 60% of the stomach positioned in the chest. No acute complication. -EGD 03/2018: 3 cm HH, mild gastritis.  Negative biopsies for HP.  04/14/2016-3 cm HH.  Neg SB Bx for celiac.  EGD 11/30/2014: Gastric ulcers, hiatal hernia, neg Bx -Colonoscopy 03/2018: Normal cecum.  6 mm polyp s/p polypectomy (TA), moderate left colonic diverticulosis, internal hemorrhoids, negative random colonic biopsies for microscopic colitis.  Repeat in 5 years.  04/20/2014; mild diverticulosis, previous hemorrhoidectomy.  Otherwise normal colonoscopy -CT AP without IV contrast 01/29/2020: Large HH, soft tissue fullness in the cecum, pelvic floor laxity, constipation.  03/26/2014: Small adrenal adenoma, otherwise normal.   Past Medical History:  Diagnosis Date   Allergy    Anemia    Anxiety    Aortic atherosclerosis (HCC)    Arthritis    Cataract    Depression    Family history of colon cancer    Fibromyalgia    GERD (gastroesophageal reflux disease)    Hyperlipidemia    IBS (irritable bowel syndrome)    Rectal prolapse    Stomach ulcer    Tick bite    took antibiotics memorial day weekend 2021 on back    Current Outpatient Medications  Medication Sig Dispense Refill   Biotin 10 MG CAPS Take by mouth.     calcium-vitamin D (OSCAL WITH D) 500-200 MG-UNIT TABS tablet Take by mouth.     clotrimazole-betamethasone (LOTRISONE) cream Apply topically.     cyanocobalamin 1000  MCG tablet Take by mouth.     cyclobenzaprine (FLEXERIL) 5 MG tablet Take 1 tablet (5 mg total) by mouth 3 (three) times daily as needed for muscle spasms. 30 tablet 3   dicyclomine (BENTYL) 10 MG capsule Take 1 capsule (10 mg total) by mouth 3 (three) times daily before meals. As needed 90 capsule 8   diphenhydrAMINE (BENADRYL) 50 MG tablet Take 1 tablet (50 mg total) by mouth once for 1 dose. 1 tablet 0   ketoconazole (NIZORAL) 2 % cream  Apply to lower abdominal rash 15 g 0   magnesium hydroxide (MILK OF MAGNESIA) 400 MG/5ML suspension Take 15 mLs by mouth daily as needed for mild constipation.     omeprazole (PRILOSEC) 40 MG capsule TAKE 1 CAPSULE (40 MG TOTAL) BY MOUTH IN THE MORNING AND AT BEDTIME. 180 capsule 1   pyridoxine (B-6) 100 MG tablet Take by mouth.     triamcinolone cream (KENALOG) 0.1 % Apply to right ear rash twice daily 30 g 0   citalopram (CELEXA) 10 MG tablet Take 1 tablet (10 mg total) by mouth daily. (Patient not taking: Reported on 08/31/2021) 30 tablet 3   rosuvastatin (CRESTOR) 10 MG tablet One twice a week (Patient not taking: Reported on 08/31/2021) 24 tablet 0   No current facility-administered medications for this visit.     Family History  Problem Relation Age of Onset   Colon cancer Mother    Liver cancer Father    CAD Father    Hypertension Sister    Colon cancer Sister    Hyperlipidemia Brother    Gout Brother    Diabetes Maternal Grandmother    Diabetes Paternal Grandmother    Heart attack Other    Cancer Other        Leiomyosarcome and Liver   Migraines Other    Cancer Maternal Aunt        "female cancer, not breast"   Cancer Paternal Aunt        "female cancer, not breast"   Colon polyps Neg Hx    Esophageal cancer Neg Hx    Pancreatic cancer Neg Hx    Rectal cancer Neg Hx    Stomach cancer Neg Hx     Social History   Tobacco Use   Smoking status: Never   Smokeless tobacco: Never  Vaping Use   Vaping Use: Never used  Substance Use Topics   Alcohol use: Never   Drug use: Never    Allergies  Allergen Reactions   Amoxicillin-Pot Clavulanate Nausea Only    Stomach pain    Iohexol Rash    HAD FACIAL RASH WITH CT CONTRAST IN 2006      Review of Systems: All systems reviewed and negative except where noted in HPI.    Physical Exam:     BP (!) 128/94 (BP Location: Left Arm, Patient Position: Sitting, Cuff Size: Normal)   Pulse 77   Ht 5' 6.5" (1.689 m)    Wt 226 lb 2 oz (102.6 kg)   SpO2 97%   BMI 35.95 kg/m  GENERAL:  Alert, oriented, cooperative, not in acute distress. PSYCH: :Pleasant, normal mood and affect. HEENT:  conjunctiva pink, mucous membranes moist, neck supple without masses. No jaundice. ABDOMEN: Inspection: No visible peristalsis, no abnormal pulsations, skin normal.  Palpation/percussion: Soft, nontender, nondistended, no rigidity, no abnormal dullness to percussion, no hepatosplenomegaly and no palpable abdominal masses.  Auscultation: Normal bowel sounds, no abdominal bruits. Rectal exam: Deferred SKIN:  turgor,  no lesions seen. Musculoskeletal:  Normal muscle tone, normal strength. NEURO: Alert and oriented x 3, no focal neurologic deficits.    Phiona Ramnauth,MD 08/31/2021, 1:41 PM   CC Cox, Elnita Maxwell, MD

## 2021-08-31 NOTE — Patient Instructions (Addendum)
If you are age 72 or older, your body mass index should be between 23-30. Your Body mass index is 35.95 kg/m. If this is out of the aforementioned range listed, please consider follow up with your Primary Care Provider.  If you are age 75 or younger, your body mass index should be between 19-25. Your Body mass index is 35.95 kg/m. If this is out of the aformentioned range listed, please consider follow up with your Primary Care Provider.   ________________________________________________________  The [facility] providers would like to encourage you to use [facility] to communicate with providers for non-urgent requests or questions.  Due to long hold times on the telephone, sending your provider a message by [facility] may be a faster and more efficient way to get a response.  Please allow 48 business hours for a response.  Please remember that this is for non-urgent requests.  _______________________________________________________  Please purchase the following medications over the counter and take as directed: Miralax 17g 2 times daily until good bowel movement then daily  We have sent the following medications to your pharmacy for you to pick up at your convenience: Omeprazole  You have been scheduled for an endoscopy and colonoscopy. Please follow the written instructions given to you at your visit today. Please pick up your prep supplies at the pharmacy within the next 1-3 days. If you use inhalers (even only as needed), please bring them with you on the day of your procedure.  11-09-2021 Two days before your procedure: Mix 3 packs (or capfuls) of Miralax in 48 ounces of clear liquid and drink at 6pm.   Continue magnesium  Please call with any questions or concerns.  Kegel Exercises Kegel exercises can help strengthen your pelvic floor muscles. The pelvic floor is a group of muscles that support your rectum, small intestine, and bladder. In females, pelvic floor muscles also help support  the uterus. These muscles help you control the flow of urine and stool (feces). Kegel exercises are painless and simple. They do not require any equipment. Your provider may suggest Kegel exercises to: Improve bladder and bowel control. Improve sexual response. Improve weak pelvic floor muscles after surgery to remove the uterus (hysterectomy) or after pregnancy, in females. Improve weak pelvic floor muscles after prostate gland removal or surgery, in males. Kegel exercises involve squeezing your pelvic floor muscles. These are the same muscles you squeeze when you try to stop the flow of urine or keep from passing gas. The exercises can be done while sitting, standing, or lying down, but it is best to vary your position. Ask your health care provider which exercises are safe for you. Do exercises exactly as told by your health care provider and adjust them as directed. Do not begin these exercises until told by your health care provider. Exercises How to do Kegel exercises: Squeeze your pelvic floor muscles tight. You should feel a tight lift in your rectal area. If you are a female, you should also feel a tightness in your vaginal area. Keep your stomach, buttocks, and legs relaxed. Hold the muscles tight for up to 10 seconds. Breathe normally. Relax your muscles for up to 10 seconds. Repeat as told by your health care provider. Repeat this exercise daily as told by your health care provider. Continue to do this exercise for at least 4-6 weeks, or for as long as told by your health care provider. You may be referred to a physical therapist who can help you learn more about  how to do Kegel exercises. Depending on your condition, your health care provider may recommend: Varying how long you squeeze your muscles. Doing several sets of exercises every day. Doing exercises for several weeks. Making Kegel exercises a part of your regular exercise routine. This information is not intended to replace  advice given to you by your health care provider. Make sure you discuss any questions you have with your health care provider. Document Revised: 01/20/2021 Document Reviewed: 01/20/2021 Elsevier Patient Education  2022 [employer].

## 2021-08-31 NOTE — Telephone Encounter (Signed)
Patient not taking Rosuvastatin and Citalopram due to abdomnial pain. Seeing Gastro on 08/31/21 to discuss. Has F/U with PCP on 09/01/21. Will let PCP know

## 2021-09-01 ENCOUNTER — Ambulatory Visit: Payer: PPO | Admitting: Family Medicine

## 2021-09-04 DIAGNOSIS — R300 Dysuria: Secondary | ICD-10-CM | POA: Insufficient documentation

## 2021-09-04 NOTE — Assessment & Plan Note (Signed)
Check labs.  Check h. Pylori ab.

## 2021-09-04 NOTE — Assessment & Plan Note (Signed)
Recommend calcium with D 1200 mg daily.

## 2021-09-04 NOTE — Assessment & Plan Note (Signed)
Start on citalopram 10 mg daily.

## 2021-09-04 NOTE — Assessment & Plan Note (Signed)
Continue bentyl.

## 2021-09-04 NOTE — Assessment & Plan Note (Signed)
Not at goal  Recommend start crestor 10 mg once daily at night.  Continue to work on eating a healthy diet and exercise.  Labs drawn today.

## 2021-09-04 NOTE — Assessment & Plan Note (Signed)
Recommend home sleep study to reassess OSA.

## 2021-09-04 NOTE — Assessment & Plan Note (Signed)
Check FLP.  Recommend start on crestor 10 mg once daily.  Recommend continue to work on eating healthy diet and exercise.

## 2021-09-04 NOTE — Assessment & Plan Note (Signed)
Check UA. Normal.

## 2021-09-06 ENCOUNTER — Other Ambulatory Visit: Payer: Self-pay | Admitting: Family Medicine

## 2021-09-06 DIAGNOSIS — F331 Major depressive disorder, recurrent, moderate: Secondary | ICD-10-CM

## 2021-09-29 ENCOUNTER — Telehealth: Payer: Self-pay

## 2021-09-29 NOTE — Chronic Care Management (AMB) (Signed)
Chronic Care Management Pharmacy Assistant   Name: Patricia Leonard  MRN: [id number] DOB: [birth year]   Reason for Encounter: General Adherence Call and Schedule April Appt    Recent office visits:  None   Recent consult visits:  08/31/21 Gertie Fey) Jackquline Denmark MD. Seen for Abdominal Pain. Started Na Sulfate-K Sulfate-MG 17.5-3.13-1.6 gm/156m 1 kit oral once. Referral to Gastroenterology.   Hospital visits:  None   Medications: Outpatient Encounter Medications as of 09/29/2021  Medication Sig   diphenhydrAMINE (BENADRYL) 50 MG tablet Take 1 tablet (50 mg total) by mouth once for 1 dose.   Biotin 10 MG CAPS Take by mouth.   calcium-vitamin D (OSCAL WITH D) 500-200 MG-UNIT TABS tablet Take by mouth.   citalopram (CELEXA) 10 MG tablet TAKE 1 TABLET BY MOUTH EVERY DAY   clotrimazole-betamethasone (LOTRISONE) cream Apply topically.   cyanocobalamin 1000 MCG tablet Take by mouth.   cyclobenzaprine (FLEXERIL) 5 MG tablet Take 1 tablet (5 mg total) by mouth 3 (three) times daily as needed for muscle spasms.   dicyclomine (BENTYL) 10 MG capsule Take 1 capsule (10 mg total) by mouth 3 (three) times daily before meals. As needed   ketoconazole (NIZORAL) 2 % cream Apply to lower abdominal rash   magnesium hydroxide (MILK OF MAGNESIA) 400 MG/5ML suspension Take 15 mLs by mouth daily as needed for mild constipation.   omeprazole (PRILOSEC) 40 MG capsule Take 1 capsule (40 mg total) by mouth in the morning and at bedtime.   pyridoxine (B-6) 100 MG tablet Take by mouth.   rosuvastatin (CRESTOR) 10 MG tablet One twice a week (Patient not taking: Reported on 08/31/2021)   triamcinolone cream (KENALOG) 0.1 % Apply to right ear rash twice daily   No facility-administered encounter medications on file as of 09/29/2021.   [place]   Patient is not > 5 days past due for refill on the following medications per chart  history:  Star Medications: Medication Name/mg Last Fill Days Supply Rosuvastatin 10 mg                07/25/21          84ds                                                 05/07/21            90ds   What concerns do you have about your medications? Pt is still not taking her Rosuvastatin even after seeing her Gastroenterologist. Pt is having an endoscopic procedure on 11/11/20 and still does not know what she should do because she doesn't want to make anything with her liver or her problems worse. She is feeling paranoid about everything because she has had family members die from liver disease. Pt stated she was advised to start taking her Rosuvastatin a few times a week for now.   The patient denies side effects with her medications.   How often do you forget or accidentally miss a dose? Never  Do you use a pillbox? No  Are you having any problems getting your medications from your pharmacy? No  Has the cost of your medications been a concern? No  Since last visit with CPP, no interventions have been made:   The patient has not had  an ED visit since last contact.   The patient denies problems with their health.   she denies  concerns or questions for [employer] CPP, at this time.    Care Gaps: Last annual wellness visit: 01/19/21 Mammogram: 01/10/21 Colonoscopy:04/03/18 Dexa Scan: 01/10/21  Elray Mcgregor, [place] Clinical Pharmacist Assistant  [phone]

## 2021-09-30 NOTE — Telephone Encounter (Signed)
Spoke with patient about her concerns regarding the statin. Explained this one goes through her kidney more than her liver. Patient agreed to start taking.

## 2021-11-03 DIAGNOSIS — K219 Gastro-esophageal reflux disease without esophagitis: Secondary | ICD-10-CM | POA: Diagnosis not present

## 2021-11-03 DIAGNOSIS — K311 Adult hypertrophic pyloric stenosis: Secondary | ICD-10-CM | POA: Diagnosis not present

## 2021-11-03 DIAGNOSIS — K440 Diaphragmatic hernia with obstruction, without gangrene: Secondary | ICD-10-CM | POA: Diagnosis not present

## 2021-11-03 DIAGNOSIS — Z9049 Acquired absence of other specified parts of digestive tract: Secondary | ICD-10-CM | POA: Diagnosis not present

## 2021-11-03 DIAGNOSIS — K567 Ileus, unspecified: Secondary | ICD-10-CM | POA: Diagnosis not present

## 2021-11-03 DIAGNOSIS — K2970 Gastritis, unspecified, without bleeding: Secondary | ICD-10-CM | POA: Diagnosis not present

## 2021-11-03 DIAGNOSIS — G4733 Obstructive sleep apnea (adult) (pediatric): Secondary | ICD-10-CM | POA: Diagnosis not present

## 2021-11-03 DIAGNOSIS — J189 Pneumonia, unspecified organism: Secondary | ICD-10-CM | POA: Diagnosis not present

## 2021-11-03 DIAGNOSIS — J90 Pleural effusion, not elsewhere classified: Secondary | ICD-10-CM | POA: Diagnosis not present

## 2021-11-03 DIAGNOSIS — K581 Irritable bowel syndrome with constipation: Secondary | ICD-10-CM | POA: Diagnosis not present

## 2021-11-03 DIAGNOSIS — I10 Essential (primary) hypertension: Secondary | ICD-10-CM | POA: Diagnosis not present

## 2021-11-03 DIAGNOSIS — G8918 Other acute postprocedural pain: Secondary | ICD-10-CM | POA: Diagnosis not present

## 2021-11-03 DIAGNOSIS — E860 Dehydration: Secondary | ICD-10-CM | POA: Diagnosis not present

## 2021-11-03 DIAGNOSIS — E785 Hyperlipidemia, unspecified: Secondary | ICD-10-CM | POA: Diagnosis not present

## 2021-11-03 DIAGNOSIS — R918 Other nonspecific abnormal finding of lung field: Secondary | ICD-10-CM | POA: Diagnosis not present

## 2021-11-03 DIAGNOSIS — R0689 Other abnormalities of breathing: Secondary | ICD-10-CM | POA: Diagnosis not present

## 2021-11-03 DIAGNOSIS — Q401 Congenital hiatus hernia: Secondary | ICD-10-CM | POA: Diagnosis not present

## 2021-11-03 DIAGNOSIS — F419 Anxiety disorder, unspecified: Secondary | ICD-10-CM | POA: Diagnosis not present

## 2021-11-03 DIAGNOSIS — R1111 Vomiting without nausea: Secondary | ICD-10-CM | POA: Diagnosis not present

## 2021-11-03 DIAGNOSIS — K449 Diaphragmatic hernia without obstruction or gangrene: Secondary | ICD-10-CM | POA: Diagnosis not present

## 2021-11-03 DIAGNOSIS — R1013 Epigastric pain: Secondary | ICD-10-CM | POA: Diagnosis not present

## 2021-11-03 DIAGNOSIS — K3189 Other diseases of stomach and duodenum: Secondary | ICD-10-CM | POA: Diagnosis not present

## 2021-11-03 DIAGNOSIS — J9811 Atelectasis: Secondary | ICD-10-CM | POA: Diagnosis not present

## 2021-11-03 DIAGNOSIS — R112 Nausea with vomiting, unspecified: Secondary | ICD-10-CM | POA: Diagnosis not present

## 2021-11-03 DIAGNOSIS — D649 Anemia, unspecified: Secondary | ICD-10-CM | POA: Diagnosis not present

## 2021-11-04 DIAGNOSIS — E860 Dehydration: Secondary | ICD-10-CM | POA: Diagnosis not present

## 2021-11-04 DIAGNOSIS — K3189 Other diseases of stomach and duodenum: Secondary | ICD-10-CM | POA: Diagnosis not present

## 2021-11-04 DIAGNOSIS — K581 Irritable bowel syndrome with constipation: Secondary | ICD-10-CM | POA: Diagnosis not present

## 2021-11-04 DIAGNOSIS — K440 Diaphragmatic hernia with obstruction, without gangrene: Secondary | ICD-10-CM | POA: Diagnosis not present

## 2021-11-04 DIAGNOSIS — K2970 Gastritis, unspecified, without bleeding: Secondary | ICD-10-CM | POA: Diagnosis not present

## 2021-11-04 DIAGNOSIS — K311 Adult hypertrophic pyloric stenosis: Secondary | ICD-10-CM | POA: Diagnosis not present

## 2021-11-04 DIAGNOSIS — K219 Gastro-esophageal reflux disease without esophagitis: Secondary | ICD-10-CM | POA: Diagnosis not present

## 2021-11-04 DIAGNOSIS — R112 Nausea with vomiting, unspecified: Secondary | ICD-10-CM | POA: Diagnosis not present

## 2021-11-04 DIAGNOSIS — F419 Anxiety disorder, unspecified: Secondary | ICD-10-CM | POA: Diagnosis not present

## 2021-11-05 DIAGNOSIS — K3189 Other diseases of stomach and duodenum: Secondary | ICD-10-CM | POA: Diagnosis not present

## 2021-11-05 DIAGNOSIS — K567 Ileus, unspecified: Secondary | ICD-10-CM | POA: Diagnosis not present

## 2021-11-05 DIAGNOSIS — R918 Other nonspecific abnormal finding of lung field: Secondary | ICD-10-CM | POA: Diagnosis not present

## 2021-11-05 DIAGNOSIS — K449 Diaphragmatic hernia without obstruction or gangrene: Secondary | ICD-10-CM | POA: Diagnosis not present

## 2021-11-06 DIAGNOSIS — K3189 Other diseases of stomach and duodenum: Secondary | ICD-10-CM | POA: Diagnosis not present

## 2021-11-06 DIAGNOSIS — K219 Gastro-esophageal reflux disease without esophagitis: Secondary | ICD-10-CM | POA: Diagnosis not present

## 2021-11-06 DIAGNOSIS — K449 Diaphragmatic hernia without obstruction or gangrene: Secondary | ICD-10-CM | POA: Diagnosis not present

## 2021-11-07 ENCOUNTER — Telehealth: Payer: Self-pay | Admitting: Gastroenterology

## 2021-11-07 ENCOUNTER — Telehealth: Payer: Self-pay

## 2021-11-07 DIAGNOSIS — K449 Diaphragmatic hernia without obstruction or gangrene: Secondary | ICD-10-CM | POA: Diagnosis not present

## 2021-11-07 DIAGNOSIS — D649 Anemia, unspecified: Secondary | ICD-10-CM | POA: Diagnosis not present

## 2021-11-07 DIAGNOSIS — K311 Adult hypertrophic pyloric stenosis: Secondary | ICD-10-CM | POA: Diagnosis not present

## 2021-11-07 DIAGNOSIS — K2970 Gastritis, unspecified, without bleeding: Secondary | ICD-10-CM | POA: Diagnosis not present

## 2021-11-07 DIAGNOSIS — J9811 Atelectasis: Secondary | ICD-10-CM | POA: Diagnosis not present

## 2021-11-07 DIAGNOSIS — G8918 Other acute postprocedural pain: Secondary | ICD-10-CM | POA: Diagnosis not present

## 2021-11-07 DIAGNOSIS — G4733 Obstructive sleep apnea (adult) (pediatric): Secondary | ICD-10-CM | POA: Diagnosis not present

## 2021-11-07 DIAGNOSIS — K3189 Other diseases of stomach and duodenum: Secondary | ICD-10-CM | POA: Diagnosis not present

## 2021-11-07 DIAGNOSIS — K440 Diaphragmatic hernia with obstruction, without gangrene: Secondary | ICD-10-CM | POA: Diagnosis not present

## 2021-11-07 DIAGNOSIS — K581 Irritable bowel syndrome with constipation: Secondary | ICD-10-CM | POA: Diagnosis not present

## 2021-11-07 DIAGNOSIS — K219 Gastro-esophageal reflux disease without esophagitis: Secondary | ICD-10-CM | POA: Diagnosis not present

## 2021-11-07 HISTORY — PX: HERNIA REPAIR: SHX51

## 2021-11-07 HISTORY — PX: NISSEN FUNDOPLICATION: SHX2091

## 2021-11-07 NOTE — Telephone Encounter (Signed)
Patients son called to cancel double  procedures scheduled on 11/11/21 due to an emergency the patient has and is now having the procedures done at [facility], [place]

## 2021-11-07 NOTE — Chronic Care Management (AMB) (Signed)
Chronic Care Management Pharmacy Assistant   Name: Patricia Leonard  MRN: [id number] DOB: 12/30/1950  Reason for Encounter: General Adherence Call    Recent office visits:  None  Recent consult visits:  None  Hospital visits:  None  Medications: Outpatient Encounter Medications as of 11/07/2021  Medication Sig   diphenhydrAMINE (BENADRYL) 50 MG tablet Take 1 tablet (50 mg total) by mouth once for 1 dose.   Biotin 10 MG CAPS Take by mouth.   calcium-vitamin D (OSCAL WITH D) 500-200 MG-UNIT TABS tablet Take by mouth.   citalopram (CELEXA) 10 MG tablet TAKE 1 TABLET BY MOUTH EVERY DAY   clotrimazole-betamethasone (LOTRISONE) cream Apply topically.   cyanocobalamin 1000 MCG tablet Take by mouth.   cyclobenzaprine (FLEXERIL) 5 MG tablet Take 1 tablet (5 mg total) by mouth 3 (three) times daily as needed for muscle spasms.   dicyclomine (BENTYL) 10 MG capsule Take 1 capsule (10 mg total) by mouth 3 (three) times daily before meals. As needed   ketoconazole (NIZORAL) 2 % cream Apply to lower abdominal rash   magnesium hydroxide (MILK OF MAGNESIA) 400 MG/5ML suspension Take 15 mLs by mouth daily as needed for mild constipation.   omeprazole (PRILOSEC) 40 MG capsule Take 1 capsule (40 mg total) by mouth in the morning and at bedtime.   pyridoxine (B-6) 100 MG tablet Take by mouth.   rosuvastatin (CRESTOR) 10 MG tablet One twice a week (Patient not taking: Reported on 08/31/2021)   triamcinolone cream (KENALOG) 0.1 % Apply to right ear rash twice daily   No facility-administered encounter medications on file as of 11/07/2021.   [place] for general disease state and medication adherence call.   Patient is > 5 days past due for refill on the following medications per chart history:  Star Medications: Medication Name/mg Last Fill Days Supply Rosuvastatin 10 mg                07/25/21          84ds                                                 05/07/21             90ds   What concerns do you have about your medications? Pt has no concerns   The patient denies side effects with her medications.   How often do you forget or accidentally miss a dose? Never  Do you use a pillbox? No  Are you having any problems getting your medications from your pharmacy? No  Has the cost of your medications been a concern? No  Since last visit with CPP, no interventions have been made:   The patient has had an ED visit since last contact. Pt had hiatal hernia a week ago and is at home recuperating.    The patient denies problems with their health.   she reports the following  concerns or questions for [place] at this time. Pt wanted to inform that since she just had surgery and has had a lot going on she has not started her Rosuvastatin and will pick back up her regimen once she heals up and gets back on track.  Care Gaps: Last annual wellness visit: 01/19/21 Mammogram: 01/10/21 Colonoscopy:04/03/18 Dexa Scan: 01/10/21  Elray Mcgregor, [place] Clinical Pharmacist  Assistant  [phone]

## 2021-11-08 DIAGNOSIS — J90 Pleural effusion, not elsewhere classified: Secondary | ICD-10-CM | POA: Diagnosis not present

## 2021-11-08 DIAGNOSIS — J9811 Atelectasis: Secondary | ICD-10-CM | POA: Diagnosis not present

## 2021-11-08 DIAGNOSIS — J189 Pneumonia, unspecified organism: Secondary | ICD-10-CM | POA: Diagnosis not present

## 2021-11-08 NOTE — Telephone Encounter (Signed)
No problems.  Thanks for letting me know RG 

## 2021-11-09 DIAGNOSIS — K449 Diaphragmatic hernia without obstruction or gangrene: Secondary | ICD-10-CM | POA: Diagnosis not present

## 2021-11-11 ENCOUNTER — Encounter: Payer: PPO | Admitting: Gastroenterology

## 2021-11-11 DIAGNOSIS — Z9889 Other specified postprocedural states: Secondary | ICD-10-CM | POA: Diagnosis not present

## 2021-11-11 DIAGNOSIS — J90 Pleural effusion, not elsewhere classified: Secondary | ICD-10-CM | POA: Diagnosis not present

## 2021-11-11 DIAGNOSIS — R0789 Other chest pain: Secondary | ICD-10-CM | POA: Diagnosis not present

## 2021-11-25 ENCOUNTER — Telehealth: Payer: Self-pay | Admitting: Gastroenterology

## 2021-11-25 NOTE — Telephone Encounter (Signed)
Patient called requesting to speak with a nurse regarding her Patricia Leonard medication and is asking if she still needs to be on it. ?

## 2021-11-28 NOTE — Telephone Encounter (Signed)
Lets cut down to once a day x 2 weeks, then stop ?RG ?

## 2021-11-28 NOTE — Telephone Encounter (Signed)
Pt stated that she recently had the TIF procedure on 11/07/2021  at [facility]. ?Pt is questioning if she needs to continue to  take Prilosec 40 mg twice daily as prescribed     ?Please Advise  ?

## 2021-11-29 ENCOUNTER — Telehealth: Payer: Self-pay

## 2021-11-29 NOTE — Telephone Encounter (Signed)
Pt made aware of Dr. Gupta recommendations: Pt verbalized understanding with all questions answered.   

## 2021-11-29 NOTE — Chronic Care Management (AMB) (Signed)
? ? ?Chronic Care Management ?Pharmacy Assistant  ? ?Name: Patricia Leonard  MRN: [id number] DOB: 1951-07-21 ? ? ?Reason for Encounter: General Adherence Call  ?  ?Recent office visits:  ?None ? ?Recent consult visits:  ?11/25/21 (Gastroenterology) Jackquline Denmark MD. Telephone Encounter. Instructed to cut down on Prilosec to once a day for 2 weeks then stop it.  ? ?Hospital visits:  ?None ? ?Medications: ?Outpatient Encounter Medications as of 11/29/2021  ?Medication Sig  ? diphenhydrAMINE (BENADRYL) 50 MG tablet Take 1 tablet (50 mg total) by mouth once for 1 dose.  ? Biotin 10 MG CAPS Take by mouth.  ? calcium-vitamin D (OSCAL WITH D) 500-200 MG-UNIT TABS tablet Take by mouth.  ? citalopram (CELEXA) 10 MG tablet TAKE 1 TABLET BY MOUTH EVERY DAY  ? clotrimazole-betamethasone (LOTRISONE) cream Apply topically.  ? cyanocobalamin 1000 MCG tablet Take by mouth.  ? cyclobenzaprine (FLEXERIL) 5 MG tablet Take 1 tablet (5 mg total) by mouth 3 (three) times daily as needed for muscle spasms.  ? dicyclomine (BENTYL) 10 MG capsule Take 1 capsule (10 mg total) by mouth 3 (three) times daily before meals. As needed  ? ketoconazole (NIZORAL) 2 % cream Apply to lower abdominal rash  ? magnesium hydroxide (MILK OF MAGNESIA) 400 MG/5ML suspension Take 15 mLs by mouth daily as needed for mild constipation.  ? omeprazole (PRILOSEC) 40 MG capsule Take 1 capsule (40 mg total) by mouth in the morning and at bedtime.  ? pyridoxine (B-6) 100 MG tablet Take by mouth.  ? rosuvastatin (CRESTOR) 10 MG tablet One twice a week (Patient not taking: Reported on 08/31/2021)  ? triamcinolone cream (KENALOG) 0.1 % Apply to right ear rash twice daily  ? ?No facility-administered encounter medications on file as of 11/29/2021.  ? ? ?[place] for general disease state and medication adherence call.  ? ?Patient is > 5 days past due for refill on the following medications per chart history: ? ?Star Medications: ?Medication Name/mg Last  Fill Days Supply ?Rosuvastatin 10 mg                07/25/21          84ds ?                                                05/07/21            90ds ? ? ?What concerns do you have about your medications?Pt denies any concerns  ? ?The patient denies side effects with her medications.  ? ?How often do you forget or accidentally miss a dose? Never ? ?Do you use a pillbox? No ? ?Are you having any problems getting your medications from your pharmacy? No ? ?Has the cost of your medications been a concern? No ? ?Since last visit with CPP, no interventions have been made:  ? ?The patient has had an ED visit since last contact. Pt stated she had surgery 4 weeks ago. She was admitted to the hospital and had emergency surgery called TIF. She is recovering from this and is on a soft/liquid diet. She has lost 20lbs since her surgery. Pt is currently not taking many of her medications right now. She will take a Zofran or Tylenol to help relieve symptoms.  ? ?The patient denies problems with their health.  ? ?she denies  concerns or questions for [place], at this time.  ? ? ?Care Gaps: ?Last annual wellness visit: 01/19/21 ?Mammogram: 01/10/21 ?Colonoscopy:04/03/18 ?Dexa Scan: 01/10/21 ?  ? ?Elray Mcgregor, CMA ?Clinical Pharmacist Assistant  ?[phone]  ?

## 2021-12-07 NOTE — Telephone Encounter (Signed)
Patient had recent Sx. Will have team check in monthly now instead of every other month ?

## 2021-12-12 ENCOUNTER — Telehealth: Payer: Self-pay

## 2021-12-12 NOTE — Telephone Encounter (Signed)
Patricia Leonard called with complaints of plain left axilla with pain radiating down to waist.  Her symptoms started about 2 weeks ago.  She had a hiatal hernia repair in [facility] on Feb. 13 which went well.  Her symptoms started about 2 weeks post-op. She has also been dealing with constipation and dumping.  No fever or chills reports.  She has been using heat and taking gax-x with some relief.  Symptoms were reviewed with Dr. Tobie Poet and she was encouraged to go to the Urgent Care or the ED if her symptoms worsen before her appointment on Wednesday with Dr. Henrene Pastor.   ?

## 2021-12-14 ENCOUNTER — Ambulatory Visit (INDEPENDENT_AMBULATORY_CARE_PROVIDER_SITE_OTHER): Payer: PPO | Admitting: Legal Medicine

## 2021-12-14 ENCOUNTER — Encounter: Payer: Self-pay | Admitting: Legal Medicine

## 2021-12-14 VITALS — BP 130/80 | HR 70 | Temp 98.1°F | Resp 15 | Ht 66.5 in | Wt 214.0 lb

## 2021-12-14 DIAGNOSIS — R1084 Generalized abdominal pain: Secondary | ICD-10-CM

## 2021-12-14 DIAGNOSIS — I2510 Atherosclerotic heart disease of native coronary artery without angina pectoris: Secondary | ICD-10-CM | POA: Diagnosis not present

## 2021-12-14 DIAGNOSIS — K5730 Diverticulosis of large intestine without perforation or abscess without bleeding: Secondary | ICD-10-CM | POA: Diagnosis not present

## 2021-12-14 DIAGNOSIS — I700 Atherosclerosis of aorta: Secondary | ICD-10-CM | POA: Diagnosis not present

## 2021-12-14 DIAGNOSIS — R918 Other nonspecific abnormal finding of lung field: Secondary | ICD-10-CM | POA: Diagnosis not present

## 2021-12-14 DIAGNOSIS — R0602 Shortness of breath: Secondary | ICD-10-CM | POA: Diagnosis not present

## 2021-12-14 DIAGNOSIS — N3000 Acute cystitis without hematuria: Secondary | ICD-10-CM

## 2021-12-14 DIAGNOSIS — N289 Disorder of kidney and ureter, unspecified: Secondary | ICD-10-CM | POA: Diagnosis not present

## 2021-12-14 DIAGNOSIS — K449 Diaphragmatic hernia without obstruction or gangrene: Secondary | ICD-10-CM | POA: Diagnosis not present

## 2021-12-14 DIAGNOSIS — R059 Cough, unspecified: Secondary | ICD-10-CM | POA: Diagnosis not present

## 2021-12-14 DIAGNOSIS — R109 Unspecified abdominal pain: Secondary | ICD-10-CM | POA: Diagnosis not present

## 2021-12-14 DIAGNOSIS — R0789 Other chest pain: Secondary | ICD-10-CM | POA: Diagnosis not present

## 2021-12-14 DIAGNOSIS — D3502 Benign neoplasm of left adrenal gland: Secondary | ICD-10-CM | POA: Diagnosis not present

## 2021-12-14 LAB — POCT URINALYSIS DIP (CLINITEK)
Blood, UA: NEGATIVE
Glucose, UA: NEGATIVE mg/dL
Nitrite, UA: NEGATIVE
Spec Grav, UA: 1.015 (ref 1.010–1.025)
Urobilinogen, UA: 0.2 E.U./dL
pH, UA: 6.5 (ref 5.0–8.0)

## 2021-12-14 MED ORDER — NITROFURANTOIN MONOHYD MACRO 100 MG PO CAPS: 100.0000 mg | ORAL_CAPSULE | Freq: Two times a day (BID) | ORAL | 0 refills | Status: DC

## 2021-12-14 NOTE — Progress Notes (Signed)
Subjective:  Patient ID: Patricia Leonard, female    DOB: 03/03/1951  Age: 71 y.o. MRN: [id number]  Chief Complaint  Patient presents with   Back Pain   Constipation    HPI  Patient had hiatal hernia repair on 11/07/2021 at [facility]. She is complains about sharp, aching pain on right side of the thoracic area of her back with radiation to the lateral side right breast and lower back. She is constipation since one week ago. She has been taking milk of magnesia and it did not help her. She had 2 loose BM since surgery.  We have no records about surgery.  She is having right chest wall, with ain reaching but not deep breathing. No known post op complications, she had low magnesium.  ? Nissen fundoplication. No vomiting. Current Outpatient Medications on File Prior to Visit  Medication Sig Dispense Refill   Biotin 10 MG CAPS Take by mouth.     calcium-vitamin D (OSCAL WITH D) 500-200 MG-UNIT TABS tablet Take by mouth.     citalopram (CELEXA) 10 MG tablet TAKE 1 TABLET BY MOUTH EVERY DAY 90 tablet 0   clotrimazole-betamethasone (LOTRISONE) cream Apply topically.     cyanocobalamin 1000 MCG tablet Take by mouth.     dicyclomine (BENTYL) 10 MG capsule Take 1 capsule (10 mg total) by mouth 3 (three) times daily before meals. As needed 90 capsule 8   ketoconazole (NIZORAL) 2 % cream Apply to lower abdominal rash 15 g 0   magnesium hydroxide (MILK OF MAGNESIA) 400 MG/5ML suspension Take 15 mLs by mouth daily as needed for mild constipation.     methocarbamol (ROBAXIN) 500 MG tablet Take 500 mg by mouth 4 (four) times daily as needed.     pyridoxine (B-6) 100 MG tablet Take by mouth.     rosuvastatin (CRESTOR) 10 MG tablet One twice a week 24 tablet 0   triamcinolone cream (KENALOG) 0.1 % Apply to right ear rash twice daily 30 g 0   No current facility-administered medications on file prior to visit.   Past Medical History:  Diagnosis Date   Allergy    Anemia    Anxiety     Aortic atherosclerosis (HCC)    Arthritis    Cataract    Depression    Family history of colon cancer    Fibromyalgia    GERD (gastroesophageal reflux disease)    Hyperlipidemia    IBS (irritable bowel syndrome)    Rectal prolapse    Stomach ulcer    Tick bite    took antibiotics memorial day weekend 2021 on back   Past Surgical History:  Procedure Laterality Date   APPENDECTOMY     CESAREAN SECTION     CHOLECYSTECTOMY     COLONOSCOPY  04/18/2014   Diverticulosis.    ESOPHAGOGASTRODUODENOSCOPY  04/14/2016   Schatzkis ring. Hiatal hernia. Gastritis.    HERNIA REPAIR  11/07/2021   LIPOMA EXCISION     thermal ablation     TONSILLECTOMY      Family History  Problem Relation Age of Onset   Colon cancer Mother    Liver cancer Father    CAD Father    Hypertension Sister    Colon cancer Sister    Hyperlipidemia Brother    Gout Brother    Diabetes Maternal Grandmother    Diabetes Paternal Grandmother    Heart attack Other    Cancer Other        Leiomyosarcome and  Liver   Migraines Other    Cancer Maternal Aunt        "female cancer, not breast"   Cancer Paternal Aunt        "female cancer, not breast"   Colon polyps Neg Hx    Esophageal cancer Neg Hx    Pancreatic cancer Neg Hx    Rectal cancer Neg Hx    Stomach cancer Neg Hx    Social History   Socioeconomic History   Marital status: Widowed    Spouse name: Not on file   Number of children: Not on file   Years of education: Not on file   Highest education level: Not on file  Occupational History   Not on file  Tobacco Use   Smoking status: Never   Smokeless tobacco: Never  Vaping Use   Vaping Use: Never used  Substance and Sexual Activity   Alcohol use: Never   Drug use: Never   Sexual activity: Not on file  Other Topics Concern   Not on file  Social History Narrative   Not on file   Social Determinants of Health   Financial Resource Strain: Not on file  Food Insecurity: Not on file   Transportation Needs: Not on file  Physical Activity: Not on file  Stress: Not on file  Social Connections: Not on file    Review of Systems  Constitutional:  Positive for fatigue. Negative for chills and fever.  HENT:  Negative for congestion, ear pain and sore throat.   Respiratory:  Positive for shortness of breath. Negative for cough.   Cardiovascular:  Negative for chest pain and palpitations.  Gastrointestinal:  Negative for abdominal pain, constipation, diarrhea, nausea and vomiting.  Endocrine: Negative for polydipsia, polyphagia and polyuria.  Genitourinary:  Negative for difficulty urinating and dysuria.  Musculoskeletal:  Positive for back pain. Negative for arthralgias and myalgias.  Skin:  Negative for rash.  Neurological:  Negative for headaches.  Psychiatric/Behavioral:  Negative for dysphoric mood. The patient is not nervous/anxious.     Objective:  BP 130/80   Pulse 70   Temp 98.1 F (36.7 C)   Resp 15   Ht 5' 6.5" (1.689 m)   Wt 214 lb (97.1 kg)   SpO2 96%   BMI 34.02 kg/m      12/14/2021   10:59 AM 09/04/2021    7:35 PM 08/31/2021    1:31 PM  BP/Weight  Systolic BP 130 128 128  Diastolic BP 80 74 94  Wt. (Lbs) 214 226 226.13  BMI 34.02 kg/m2 35.93 kg/m2 35.95 kg/m2    Physical Exam Vitals reviewed.  Constitutional:      General: She is not in acute distress.    Appearance: Normal appearance. She is obese.  HENT:     Head: Normocephalic.     Right Ear: Tympanic membrane normal.     Left Ear: Tympanic membrane normal.     Nose: Nose normal.     Mouth/Throat:     Mouth: Mucous membranes are moist.  Eyes:     Extraocular Movements: Extraocular movements intact.     Conjunctiva/sclera: Conjunctivae normal.     Pupils: Pupils are equal, round, and reactive to light.  Cardiovascular:     Rate and Rhythm: Normal rate and regular rhythm.     Pulses: Normal pulses.     Heart sounds: No murmur heard.   No gallop.  Pulmonary:     Effort:  Pulmonary effort is normal. No respiratory distress.  Breath sounds: No wheezing.  Abdominal:     General: Abdomen is flat. Bowel sounds are normal. There is no distension.     Tenderness: There is no abdominal tenderness.  Musculoskeletal:        General: Normal range of motion.     Cervical back: Normal range of motion.     Right lower leg: No edema.     Left lower leg: No edema.  Skin:    Capillary Refill: Capillary refill takes less than 2 seconds.  Neurological:     General: No focal deficit present.     Mental Status: She is alert and oriented to person, place, and time. Mental status is at baseline.     Gait: Gait normal.     Deep Tendon Reflexes: Reflexes normal.  Psychiatric:        Mood and Affect: Mood normal.        Thought Content: Thought content normal.        Lab Results  Component Value Date   WBC 4.1 08/05/2021   HGB 14.6 08/05/2021   HCT 43.8 08/05/2021   PLT 201 08/05/2021   GLUCOSE 93 08/05/2021   CHOL 211 (H) 08/05/2021   TRIG 94 08/05/2021   HDL 58 08/05/2021   LDLCALC 136 (H) 08/05/2021   ALT 15 08/05/2021   AST 19 08/05/2021   NA 142 08/05/2021   K 4.3 08/05/2021   CL 103 08/05/2021   CREATININE 0.74 08/05/2021   BUN 15 08/05/2021   CO2 24 08/05/2021   TSH 2.910 08/05/2021      Assessment & Plan:   Problem List Items Addressed This Visit   None Visit Diagnoses     Generalized abdominal pain    -  Primary   Relevant Orders   POCT URINALYSIS DIP (CLINITEK) (Completed)   CBC with Differential/Platelet   Comprehensive metabolic panel   CT Abdomen Pelvis Wo Contrast   Amylase   Lipase Patient was discharged from the hospital at [facility] after having hiatal hernia repair believe probably a [employer] fundoplication.  Call for records but was unable to get any of that..  She is having pain in the upper abdomen and does not want to eat.  She has not had a bowel movement in 2 weeks according to her.  We will order a stat CT scan  looking for any type of surgical complication or obstruction.   Right-sided chest wall pain       Relevant Orders   DG Chest 2 View Patient also having right-sided chest pain is more upper chest she says she is somewhat short of breath we will need to get a chest x-ray to rule out lung complications after surgery.   Acute cystitis without hematuria       Relevant Medications   nitrofurantoin, macrocrystal-monohydrate, (MACROBID) 100 MG capsule   Other Relevant Orders   Urine Culture Patient does have an acute UTI which will be cultured and she was sent home on nitrofurantoin and time.     .  Meds ordered this encounter  Medications   nitrofurantoin, macrocrystal-monohydrate, (MACROBID) 100 MG capsule    Sig: Take 1 capsule (100 mg total) by mouth 2 (two) times daily.    Dispense:  14 capsule    Refill:  0    Orders Placed This Encounter  Procedures   Urine Culture   CT Abdomen Pelvis Wo Contrast   DG Chest 2 View   CBC with Differential/Platelet   Comprehensive metabolic  panel   Amylase   Lipase   POCT URINALYSIS DIP (CLINITEK)  Patient had a stat CT of the abdomen which did not demonstrate any abnormality or surgical complication.  Her chest x-ray did show some atelectasis but no pneumonia. Patient was seen for a 40-minute visit with stat x-rays being performed as well as lab work.  Extensive discussion with the patient was performed.  Follow-up: Return in about 3 months (around 03/16/2022).  An After Visit Summary was printed and given to the patient.  Brent Bulla, MD [facility] [phone]

## 2021-12-15 ENCOUNTER — Other Ambulatory Visit: Payer: Self-pay

## 2021-12-15 DIAGNOSIS — R1084 Generalized abdominal pain: Secondary | ICD-10-CM

## 2021-12-15 LAB — COMPREHENSIVE METABOLIC PANEL
ALT: 15 IU/L (ref 0–32)
AST: 24 IU/L (ref 0–40)
Albumin/Globulin Ratio: 1.6 (ref 1.2–2.2)
Albumin: 4.2 g/dL (ref 3.7–4.7)
Alkaline Phosphatase: 98 IU/L (ref 44–121)
BUN/Creatinine Ratio: 13 (ref 12–28)
BUN: 11 mg/dL (ref 8–27)
Bilirubin Total: 0.6 mg/dL (ref 0.0–1.2)
CO2: 22 mmol/L (ref 20–29)
Calcium: 9.5 mg/dL (ref 8.7–10.3)
Chloride: 101 mmol/L (ref 96–106)
Creatinine, Ser: 0.84 mg/dL (ref 0.57–1.00)
Globulin, Total: 2.6 g/dL (ref 1.5–4.5)
Glucose: 98 mg/dL (ref 70–99)
Potassium: 4.1 mmol/L (ref 3.5–5.2)
Sodium: 140 mmol/L (ref 134–144)
Total Protein: 6.8 g/dL (ref 6.0–8.5)
eGFR: 74 mL/min/{1.73_m2} (ref >59)

## 2021-12-15 LAB — CBC WITH DIFFERENTIAL/PLATELET
Basophils Absolute: 0 10*3/uL (ref 0.0–0.2)
Basos: 1 %
EOS (ABSOLUTE): 0.1 10*3/uL (ref 0.0–0.4)
Eos: 2 %
Hematocrit: 43 % (ref 34.0–46.6)
Hemoglobin: 14.5 g/dL (ref 11.1–15.9)
Immature Grans (Abs): 0 10*3/uL (ref 0.0–0.1)
Immature Granulocytes: 0 %
Lymphocytes Absolute: 1 10*3/uL (ref 0.7–3.1)
Lymphs: 20 %
MCH: 29.4 pg (ref 26.6–33.0)
MCHC: 33.7 g/dL (ref 31.5–35.7)
MCV: 87 fL (ref 79–97)
Monocytes Absolute: 0.3 10*3/uL (ref 0.1–0.9)
Monocytes: 5 %
Neutrophils Absolute: 3.7 10*3/uL (ref 1.4–7.0)
Neutrophils: 72 %
Platelets: 208 10*3/uL (ref 150–450)
RBC: 4.93 x10E6/uL (ref 3.77–5.28)
RDW: 12.9 % (ref 11.7–15.4)
WBC: 5.1 10*3/uL (ref 3.4–10.8)

## 2021-12-15 LAB — LIPASE: Lipase: 15 U/L (ref 14–85)

## 2021-12-15 LAB — AMYLASE: Amylase: 27 U/L — ABNORMAL LOW (ref 31–110)

## 2021-12-15 NOTE — Progress Notes (Signed)
Amylase low, lipase 15- no pancreatitis, CBC normal, kidney and liver tests normal, potassium normal ?lp

## 2021-12-19 LAB — URINE CULTURE

## 2021-12-19 NOTE — Progress Notes (Signed)
Urine culture negative ?lp

## 2021-12-22 DIAGNOSIS — H40053 Ocular hypertension, bilateral: Secondary | ICD-10-CM | POA: Diagnosis not present

## 2021-12-22 DIAGNOSIS — H5203 Hypermetropia, bilateral: Secondary | ICD-10-CM | POA: Diagnosis not present

## 2021-12-22 DIAGNOSIS — H2513 Age-related nuclear cataract, bilateral: Secondary | ICD-10-CM | POA: Diagnosis not present

## 2021-12-22 DIAGNOSIS — H524 Presbyopia: Secondary | ICD-10-CM | POA: Diagnosis not present

## 2021-12-22 DIAGNOSIS — H52203 Unspecified astigmatism, bilateral: Secondary | ICD-10-CM | POA: Diagnosis not present

## 2021-12-26 ENCOUNTER — Telehealth: Payer: PPO

## 2021-12-28 ENCOUNTER — Telehealth: Payer: Self-pay

## 2021-12-28 NOTE — Progress Notes (Signed)
? ? ?Chronic Care Management ?Pharmacy Assistant  ? ?Name: Patricia Leonard  MRN: [id number] DOB: 1951/02/23 ? ? ?Reason for Encounter: General Adherence Call  ?  ?Recent office visits:  ?12/14/21 Reinaldo Meeker MD. Seen for Back pain and Constipation. Started on Macrobid '100mg'$  2 times daily. D/C Cyclobenzaprine HCI '5mg'$ , Omeprazole '40mg'$ . ? ?Recent consult visits:  ?None ? ?Hospital visits:  ?None ? ?Medications: ?Outpatient Encounter Medications as of 12/28/2021  ?Medication Sig  ? Biotin 10 MG CAPS Take by mouth.  ? calcium-vitamin D (OSCAL WITH D) 500-200 MG-UNIT TABS tablet Take by mouth.  ? citalopram (CELEXA) 10 MG tablet TAKE 1 TABLET BY MOUTH EVERY DAY  ? clotrimazole-betamethasone (LOTRISONE) cream Apply topically.  ? cyanocobalamin 1000 MCG tablet Take by mouth.  ? dicyclomine (BENTYL) 10 MG capsule Take 1 capsule (10 mg total) by mouth 3 (three) times daily before meals. As needed  ? ketoconazole (NIZORAL) 2 % cream Apply to lower abdominal rash  ? magnesium hydroxide (MILK OF MAGNESIA) 400 MG/5ML suspension Take 15 mLs by mouth daily as needed for mild constipation.  ? methocarbamol (ROBAXIN) 500 MG tablet Take 500 mg by mouth 4 (four) times daily as needed.  ? nitrofurantoin, macrocrystal-monohydrate, (MACROBID) 100 MG capsule Take 1 capsule (100 mg total) by mouth 2 (two) times daily.  ? pyridoxine (B-6) 100 MG tablet Take by mouth.  ? rosuvastatin (CRESTOR) 10 MG tablet One twice a week  ? triamcinolone cream (KENALOG) 0.1 % Apply to right ear rash twice daily  ? ?No facility-administered encounter medications on file as of 12/28/2021.  ? ? ?[place] for general disease state and medication adherence call.  ? ?Patient is > 5 days past due for refill on the following medications per chart history: ? ?[facility] Medications: ?Medication Name/mg Last Fill Days Supply ?Rosuvastatin 10 mg                07/25/21          84ds ?                                                05/07/21             90ds ? ?Pt stated she never started this medication due to everything going on. She's had surgery and now these blood clots.  ? ? ?What concerns do you have about your medications?Pt has no concerns  ? ?The patient denies side effects with her medications.  ? ?How often do you forget or accidentally miss a dose? Never ? ?Do you use a pillbox? No ? ?Are you having any problems getting your medications from your pharmacy? No ? ?Has the cost of your medications been a concern? No ? ?Since last visit with CPP, the following interventions have been made: Pt has been put on Eliquis and levofloxacin due to blood clots and pneumonia in her lungs.Pt is still not feeling like herself but she is getting better. Pt checked her O2 levels and they were good last night. Pt legs have been hurting and she is worried about another blood clot. The provider she brought it up to stated the Eliquis would take care of it if she had one. She will follow up with Dr. Tobie Poet in two weeks.  ? ?The patient had an ED visit since last contact.  ? ?The  patient denies problems with their health.  ? ?she denies  concerns or questions for [place], at this time.  ? ? ?Care Gaps: ?Last annual wellness visit: 01/19/21 ?Mammogram: 01/10/21 ?Colonoscopy:04/03/18 ?Dexa Scan: 01/10/21 ? ?Elray Mcgregor, CMA ?Clinical Pharmacist Assistant  ?[phone]  ?

## 2021-12-29 ENCOUNTER — Ambulatory Visit: Payer: PPO | Admitting: Legal Medicine

## 2021-12-29 DIAGNOSIS — Z91041 Radiographic dye allergy status: Secondary | ICD-10-CM | POA: Diagnosis not present

## 2021-12-29 DIAGNOSIS — K219 Gastro-esophageal reflux disease without esophagitis: Secondary | ICD-10-CM | POA: Diagnosis not present

## 2021-12-29 DIAGNOSIS — Z792 Long term (current) use of antibiotics: Secondary | ICD-10-CM | POA: Diagnosis not present

## 2021-12-29 DIAGNOSIS — I361 Nonrheumatic tricuspid (valve) insufficiency: Secondary | ICD-10-CM | POA: Diagnosis not present

## 2021-12-29 DIAGNOSIS — I700 Atherosclerosis of aorta: Secondary | ICD-10-CM | POA: Diagnosis not present

## 2021-12-29 DIAGNOSIS — F32A Depression, unspecified: Secondary | ICD-10-CM | POA: Diagnosis not present

## 2021-12-29 DIAGNOSIS — Z7901 Long term (current) use of anticoagulants: Secondary | ICD-10-CM | POA: Diagnosis not present

## 2021-12-29 DIAGNOSIS — R079 Chest pain, unspecified: Secondary | ICD-10-CM | POA: Diagnosis not present

## 2021-12-29 DIAGNOSIS — D509 Iron deficiency anemia, unspecified: Secondary | ICD-10-CM | POA: Diagnosis not present

## 2021-12-29 DIAGNOSIS — I340 Nonrheumatic mitral (valve) insufficiency: Secondary | ICD-10-CM | POA: Diagnosis not present

## 2021-12-29 DIAGNOSIS — F419 Anxiety disorder, unspecified: Secondary | ICD-10-CM | POA: Diagnosis not present

## 2021-12-29 DIAGNOSIS — R109 Unspecified abdominal pain: Secondary | ICD-10-CM | POA: Diagnosis not present

## 2021-12-29 DIAGNOSIS — J189 Pneumonia, unspecified organism: Secondary | ICD-10-CM | POA: Diagnosis not present

## 2021-12-29 DIAGNOSIS — Z87440 Personal history of urinary (tract) infections: Secondary | ICD-10-CM | POA: Diagnosis not present

## 2021-12-29 DIAGNOSIS — I2699 Other pulmonary embolism without acute cor pulmonale: Secondary | ICD-10-CM | POA: Diagnosis not present

## 2021-12-29 DIAGNOSIS — J181 Lobar pneumonia, unspecified organism: Secondary | ICD-10-CM | POA: Diagnosis not present

## 2021-12-29 DIAGNOSIS — E7800 Pure hypercholesterolemia, unspecified: Secondary | ICD-10-CM | POA: Diagnosis not present

## 2021-12-29 DIAGNOSIS — M1990 Unspecified osteoarthritis, unspecified site: Secondary | ICD-10-CM | POA: Diagnosis not present

## 2022-01-03 ENCOUNTER — Encounter: Payer: Self-pay | Admitting: Legal Medicine

## 2022-01-03 ENCOUNTER — Ambulatory Visit (INDEPENDENT_AMBULATORY_CARE_PROVIDER_SITE_OTHER): Payer: PPO | Admitting: Legal Medicine

## 2022-01-03 VITALS — BP 118/74 | HR 79 | Temp 98.5°F | Ht 66.5 in | Wt 205.2 lb

## 2022-01-03 DIAGNOSIS — I2699 Other pulmonary embolism without acute cor pulmonale: Secondary | ICD-10-CM

## 2022-01-03 DIAGNOSIS — I2694 Multiple subsegmental pulmonary emboli without acute cor pulmonale: Secondary | ICD-10-CM | POA: Diagnosis not present

## 2022-01-03 MED ORDER — APIXABAN 5 MG PO TABS: 5.0000 mg | ORAL_TABLET | Freq: Two times a day (BID) | ORAL | 6 refills | Status: DC

## 2022-01-03 NOTE — Progress Notes (Signed)
? ?Subjective:  ?Patient ID: Patricia Leonard, female    DOB: March 06, 1951  Age: 71 y.o. MRN: [id number] ? ?Chief Complaint  ?Patient presents with  ? Transitions Of Care  ? Pneumonia  ? ? ?Pneumonia ?She complains of cough. There is no shortness of breath or wheezing. Pertinent negatives include no appetite change, chest pain, ear pain, fever, headaches, myalgias, rhinorrhea, sneezing or sore throat.  ?  ?Patient went to ED for right side pain. Patient was admitted to [facility] on 12/29/2021 and discharged 12/30/2021. DX with pneumonia and bilateral pulmonary emboli.  She has NASH Patient has been started on eliquis. ? ?She is still weak but O2 sat 96%.  Right side still painful.  Eliquis  ? ?Current Outpatient Medications on File Prior to Visit  ?Medication Sig Dispense Refill  ? Biotin 10 MG CAPS Take by mouth.    ? calcium-vitamin D (OSCAL WITH D) 500-200 MG-UNIT TABS tablet Take by mouth.    ? citalopram (CELEXA) 10 MG tablet TAKE 1 TABLET BY MOUTH EVERY DAY 90 tablet 0  ? clotrimazole-betamethasone (LOTRISONE) cream Apply topically.    ? cyanocobalamin 1000 MCG tablet Take by mouth.    ? dicyclomine (BENTYL) 10 MG capsule Take 1 capsule (10 mg total) by mouth 3 (three) times daily before meals. As needed 90 capsule 8  ? ketoconazole (NIZORAL) 2 % cream Apply to lower abdominal rash 15 g 0  ? levofloxacin (LEVAQUIN) 750 MG tablet Take 750 mg by mouth daily.    ? magnesium hydroxide (MILK OF MAGNESIA) 400 MG/5ML suspension Take 15 mLs by mouth daily as needed for mild constipation.    ? methocarbamol (ROBAXIN) 500 MG tablet Take 500 mg by mouth 4 (four) times daily as needed.    ? nitrofurantoin, macrocrystal-monohydrate, (MACROBID) 100 MG capsule Take 1 capsule (100 mg total) by mouth 2 (two) times daily. 14 capsule 0  ? pyridoxine (B-6) 100 MG tablet Take by mouth.    ? rosuvastatin (CRESTOR) 10 MG tablet One twice a week 24 tablet 0  ? triamcinolone cream (KENALOG) 0.1 % Apply to right ear rash twice daily 30 g 0   ? ?No current facility-administered medications on file prior to visit.  ? ?Past Medical History:  ?Diagnosis Date  ? Allergy   ? Anemia   ? Anxiety   ? Aortic atherosclerosis ([place])   ? Arthritis   ? Cataract   ? Depression   ? Family history of colon cancer   ? Fibromyalgia   ? GERD (gastroesophageal reflux disease)   ? Hyperlipidemia   ? IBS (irritable bowel syndrome)   ? Rectal prolapse   ? Stomach ulcer   ? Tick bite   ? took antibiotics memorial day weekend 2021 on back  ? ?Past Surgical History:  ?Procedure Laterality Date  ? APPENDECTOMY    ? CESAREAN SECTION    ? CHOLECYSTECTOMY    ? COLONOSCOPY  04/18/2014  ? Diverticulosis.   ? ESOPHAGOGASTRODUODENOSCOPY  04/14/2016  ? Schatzkis ring. Hiatal hernia. Gastritis.   ? HERNIA REPAIR  11/07/2021  ? LIPOMA EXCISION    ? thermal ablation    ? TONSILLECTOMY    ?  ?Family History  ?Problem Relation Age of Onset  ? Colon cancer Mother   ? Liver cancer Father   ? CAD Father   ? Hypertension Sister   ? Colon cancer Sister   ? Hyperlipidemia Brother   ? Gout Brother   ? Diabetes Maternal Grandmother   ? Diabetes  Paternal Grandmother   ? Heart attack Other   ? Cancer Other   ?     Leiomyosarcome and Liver  ? Migraines Other   ? Cancer Maternal Aunt   ?     "female cancer, not breast"  ? Cancer Paternal Aunt   ?     "female cancer, not breast"  ? Colon polyps Neg Hx   ? Esophageal cancer Neg Hx   ? Pancreatic cancer Neg Hx   ? Rectal cancer Neg Hx   ? Stomach cancer Neg Hx   ? ?Social History  ? ?Socioeconomic History  ? Marital status: Widowed  ?  Spouse name: Not on file  ? Number of children: Not on file  ? Years of education: Not on file  ? Highest education level: Not on file  ?Occupational History  ? Not on file  ?Tobacco Use  ? Smoking status: Never  ? Smokeless tobacco: Never  ?Vaping Use  ? Vaping Use: Never used  ?Substance and Sexual Activity  ? Alcohol use: Never  ? Drug use: Never  ? Sexual activity: Not on file  ?Other Topics Concern  ? Not on file  ?Social  History Narrative  ? Not on file  ? ?Social Determinants of Health  ? ?Financial Resource Strain: Not on file  ?Food Insecurity: Not on file  ?Transportation Needs: Not on file  ?Physical Activity: Not on file  ?Stress: Not on file  ?Social Connections: Not on file  ? ? ?Review of Systems  ?Constitutional:  Negative for appetite change, chills, fatigue and fever.  ?HENT:  Negative for congestion, ear discharge, ear pain, rhinorrhea, sinus pressure, sneezing and sore throat.   ?Eyes:  Negative for visual disturbance.  ?Respiratory:  Positive for cough. Negative for chest tightness, shortness of breath and wheezing.   ?Cardiovascular:  Negative for chest pain, palpitations and leg swelling.  ?Gastrointestinal:  Negative for abdominal pain, diarrhea, nausea and vomiting.  ?Endocrine: Negative for polydipsia, polyphagia and polyuria.  ?Genitourinary:  Negative for difficulty urinating, dysuria, frequency, hematuria, menstrual problem, urgency, vaginal bleeding, vaginal discharge and vaginal pain.  ?Musculoskeletal:  Negative for back pain, gait problem, joint swelling, myalgias and neck pain.  ?Neurological:  Negative for dizziness, seizures, syncope, weakness, numbness and headaches.  ?Psychiatric/Behavioral:  Negative for agitation, confusion, hallucinations, sleep disturbance and suicidal ideas. The patient is not nervous/anxious.   ? ? ?Objective:  ?BP 118/74   Pulse 79   Temp 98.5 ?F (36.9 ?C)   Ht 5' 6.5" (1.689 m)   Wt 205 lb 3.2 oz (93.1 kg)   SpO2 96%   BMI 32.62 kg/m?  ? ? ?  01/03/2022  ? 10:56 AM 12/14/2021  ? 10:59 AM 09/04/2021  ?  7:35 PM  ?BP/Weight  ?Systolic BP 242 683 419  ?Diastolic BP 74 80 74  ?Wt. (Lbs) 205.2 214 226  ?BMI 32.62 kg/m2 34.02 kg/m2 35.93 kg/m2  ? ? ?Physical Exam ?Vitals reviewed.  ?Constitutional:   ?   Appearance: Normal appearance.  ?HENT:  ?   Right Ear: Tympanic membrane normal.  ?   Left Ear: Tympanic membrane normal.  ?   Nose: Nose normal.  ?   Mouth/Throat:  ?   Mouth:  Mucous membranes are moist.  ?   Pharynx: Oropharynx is clear.  ?Eyes:  ?   Conjunctiva/sclera: Conjunctivae normal.  ?   Pupils: Pupils are equal, round, and reactive to light.  ?Cardiovascular:  ?   Rate and Rhythm: Normal rate  and regular rhythm.  ?   Pulses: Normal pulses.  ?   Heart sounds: Normal heart sounds. No murmur heard. ?  No gallop.  ?Pulmonary:  ?   Effort: Pulmonary effort is normal. No respiratory distress.  ?   Breath sounds: Normal breath sounds. No wheezing.  ?Abdominal:  ?   General: Abdomen is flat. Bowel sounds are normal.  ?   Palpations: Abdomen is soft.  ?Musculoskeletal:     ?   General: Normal range of motion.  ?   Cervical back: Normal range of motion and neck supple.  ?   Right lower leg: No edema.  ?   Left lower leg: No edema.  ?   Comments: Pain medial right knee with fullness suggestive of superficial phlebitis.  She does not want dopplers at present, she will use warm moist compressed  ?Skin: ?   General: Skin is warm and dry.  ?   Capillary Refill: Capillary refill takes less than 2 seconds.  ?Neurological:  ?   General: No focal deficit present.  ?   Mental Status: She is alert and oriented to person, place, and time. Mental status is at baseline.  ? ? ? ?  ? ?Lab Results  ?Component Value Date  ? WBC 5.1 12/14/2021  ? HGB 14.5 12/14/2021  ? HCT 43.0 12/14/2021  ? PLT 208 12/14/2021  ? GLUCOSE 98 12/14/2021  ? CHOL 211 (H) 08/05/2021  ? TRIG 94 08/05/2021  ? HDL 58 08/05/2021  ? LDLCALC 136 (H) 08/05/2021  ? ALT 15 12/14/2021  ? AST 24 12/14/2021  ? NA 140 12/14/2021  ? K 4.1 12/14/2021  ? CL 101 12/14/2021  ? CREATININE 0.84 12/14/2021  ? BUN 11 12/14/2021  ? CO2 22 12/14/2021  ? TSH 2.910 08/05/2021  ? ? ? ? ?Assessment & Plan:  ? ?Problem List Items Addressed This Visit   ? ?  ? Cardiovascular and Mediastinum  ? Pulmonary embolus (HCC) - Primary  ? Relevant Medications  ? apixaban (ELIQUIS) 5 MG TABS tablet ?Patient is improved with eliquis and having no hypoxemia but still  some right sided pain and fatigue.  She has painful area medial right leg, patient does not want dopplers so use warm moist heat. RX for continued eliquis treatment  ?. ? ?Meds ordered this encounter  ?Medication

## 2022-01-06 ENCOUNTER — Telehealth: Payer: Self-pay | Admitting: Gastroenterology

## 2022-01-06 NOTE — Telephone Encounter (Signed)
Inbound call from patient stating that she is in need of a OV due to having abd pain after having surgery on her hernia. Patient stated that she could not wait until 5/15 and did not want to see a PA. Also stated that she was at [facility] last week and they found out she has blood clots in her lugs and did as CT as well. Patient is requesting a call back to discuss. Please advise.  ?

## 2022-01-06 NOTE — Telephone Encounter (Signed)
Pt stated that she is having abdominal pain; nausea, very gassy, abdominal spasms: ?Pt requesting earliest appointment to see a provider: ?Pt was scheduled to see Vicie Mutters PA  on 01/10/2022 at 3:30: Pt made aware ?Pt encouraged to eat a bland diet , Stay Hydrated, Try OTC gasX ?Pt made aware if symptoms worsen don't hesitate to go to the ER:    ?Pt verbalized understanding with all questions answered.  ? ?

## 2022-01-09 NOTE — Progress Notes (Signed)
? ? ?01/10/2022 ?Patricia Leonard ?[id number] ?1951-01-19 ? ?Referring provider: Rochel Brome, MD ?Primary GI doctor: Dr. Lyndel Safe ? ?ASSESSMENT AND PLAN:  ? ?History of colonic polyps with Family history of colon cancer ?Due for colonoscopy but with recent PE will have to wait 6 months ideally or at least 3 months before stopping eliquis. ?No anemia  ? ?Irritable bowel syndrome with constipation ?Continue miralax daily, can consider trulance/motegrity? ? ?Hiatal hernia with GERD ? status post TIF procedure with Nissan fundoplication ?CT AB and pelvis 03/25 showed intact hiatal hernia repair and was normal ? ?Ab bloating ?some nausea, no vomiting ?Some burping and early satiety.  ?Suggest gastroparesis study as this can happen post nissan fundiplication- wants to wait 1 month- given diet ?Continue Gas x ?Given FODMAP information and discussed diet.  ?Will talk with Dr. Lyndel Safe about any other recommendation.  ? ?Acute pulmonary embolism without acute cor pulmonale, unspecified pulmonary embolism type ([place]) ?pneumonia with bilateral PE 12/29/2021 and now on eliquis ?Ideally would wait at least 3 months versus 6 months for any procedures.  ? ? ?History of Present Illness:  ?71 y.o. female  with a past medical history of hyperlipidemia, GERD with history of peptic ulcer disease, diverticulosis, internal hemorrhoids, status post cholecystectomy and others listed below, returns to clinic today for evaluation of abdominal pain and nausea. ? ?Last seen by Dr. Lyndel Safe 08/31/2021 for reflux with history of large hiatal hernia status post Coley, history of tubular adenoma colonoscopy 05/2018, IBS C failing Linzess, pelvic floor laxity.  ?At that visit patient was set up for colonoscopy and endoscopy with Dr. Lyndel Safe however she had an emergency at [facility] and had procedures done there. ?Patient status post TIF procedure with Nissan fundoplication, has not had colonoscopy.  ?Most recently had pneumonia with bilateral  PE 12/29/2021 and now on eliquis.  ? ?She states her bowel are very narrow stools with yellow for several days, she has been on the miralax daily and were doing better with her stools until this morning with dumping loose stool dark brown.  ?She has had some nausea, no vomiting.  ?She continues to have burping and taking a lot of gas x.  ?She had CT AB and pelvis without contrast for flank pain that was the PE on 12/16/2022- showed normal pancreatitis, spleen, normal stomach, hiatal hernia interval repair.  ?No dysphagia. No GERD.  ? ?Past GI procedures: ?-UGI 02/2020: Large hiatal hernia, with approximately 60% of the stomach positioned in the chest. No acute complication. ?-EGD 03/2018: 3 cm HH, mild gastritis.  Negative biopsies for HP.  04/14/2016-3 cm HH.  Neg SB Bx for celiac.  EGD 11/30/2014: Gastric ulcers, hiatal hernia, neg Bx ?-Colonoscopy 03/2018: Normal cecum.  6 mm polyp s/p polypectomy (TA), moderate left colonic diverticulosis, internal hemorrhoids, negative random colonic biopsies for microscopic colitis.  Repeat in 5 years.  04/20/2014; mild diverticulosis, previous hemorrhoidectomy.  Otherwise normal colonoscopy ?-CT AP without IV contrast 01/29/2020: Large HH, soft tissue fullness in the cecum, pelvic floor laxity, constipation.  03/26/2014: Small adrenal adenoma, otherwise normal. ? ? ?Current Medications:  ? ? ? ? ? ?Current Outpatient Medications (Hematological):  ?  apixaban (ELIQUIS) 5 MG TABS tablet, Take 1 tablet (5 mg total) by mouth 2 (two) times daily. ?  cyanocobalamin 1000 MCG tablet, Take by mouth. ? ?Current Outpatient Medications (Other):  ?  Biotin 10 MG CAPS, Take by mouth. ?  calcium-vitamin D (OSCAL WITH D) 500-200 MG-UNIT TABS tablet, Take by mouth. ?  clotrimazole-betamethasone (LOTRISONE) cream, Apply topically. ?  dicyclomine (BENTYL) 10 MG capsule, Take 1 capsule (10 mg total) by mouth 3 (three) times daily before meals. As needed ?  ketoconazole (NIZORAL) 2 % cream, Apply to lower  abdominal rash ?  magnesium hydroxide (MILK OF MAGNESIA) 400 MG/5ML suspension, Take 15 mLs by mouth daily as needed for mild constipation. ?  methocarbamol (ROBAXIN) 500 MG tablet, Take 500 mg by mouth 4 (four) times daily as needed. ?  pyridoxine (B-6) 100 MG tablet, Take by mouth. ?  triamcinolone cream (KENALOG) 0.1 %, Apply to right ear rash twice daily ? ?Surgical History:  ?She  has a past surgical history that includes Tonsillectomy; Appendectomy; Cholecystectomy; Cesarean section; Colonoscopy (04/18/2014); Esophagogastroduodenoscopy (04/14/2016); Lipoma excision; thermal ablation; and Hernia repair (11/07/2021). ?Family History:  ?Her family history includes CAD in her father; Cancer in her maternal aunt, paternal aunt, and another family member; Colon cancer in her mother and sister; Diabetes in her maternal grandmother and paternal grandmother; Gout in her brother; Heart attack in an other family member; Hyperlipidemia in her brother; Hypertension in her sister; Liver cancer in her father; Migraines in an other family member. ?Social History:  ? reports that she has never smoked. She has never used smokeless tobacco. She reports that she does not drink alcohol and does not use drugs. ? ?Current Medications, Allergies, Past Medical History, Past Surgical History, Family History and Social History were reviewed in [employer] record. ? ?Physical Exam: ?BP (!) 144/82   Pulse 70   Ht 5' 6.5" (1.689 m)   Wt 214 lb (97.1 kg)   BMI 34.02 kg/m?  ?General:   Pleasant, well developed female in no acute distress ?Heart:  Regular rate and rhythm; no murmurs ?Pulm: Clear anteriorly; no wheezing ?Abdomen:  Soft, Obese AB, Active bowel sounds. mild tenderness in the RUQ. Without guarding and Without rebound, No organomegaly appreciated. ?Extremities:  without  edema. ?Neurologic:  Alert and  oriented x4;  No focal deficits.  ?Psych:  Cooperative. Normal mood and affect. ? ? ?Vladimir Crofts,  PA-C ?01/10/22 ?

## 2022-01-10 ENCOUNTER — Ambulatory Visit (INDEPENDENT_AMBULATORY_CARE_PROVIDER_SITE_OTHER): Payer: PPO | Admitting: Physician Assistant

## 2022-01-10 VITALS — BP 144/82 | HR 70 | Ht 66.5 in | Wt 214.0 lb

## 2022-01-10 DIAGNOSIS — K449 Diaphragmatic hernia without obstruction or gangrene: Secondary | ICD-10-CM | POA: Diagnosis not present

## 2022-01-10 DIAGNOSIS — Z800 Family history of malignant neoplasm of digestive organs: Secondary | ICD-10-CM

## 2022-01-10 DIAGNOSIS — K581 Irritable bowel syndrome with constipation: Secondary | ICD-10-CM | POA: Diagnosis not present

## 2022-01-10 DIAGNOSIS — Z86010 Personal history of colonic polyps: Secondary | ICD-10-CM | POA: Diagnosis not present

## 2022-01-10 DIAGNOSIS — K219 Gastro-esophageal reflux disease without esophagitis: Secondary | ICD-10-CM

## 2022-01-10 DIAGNOSIS — I2699 Other pulmonary embolism without acute cor pulmonale: Secondary | ICD-10-CM

## 2022-01-10 NOTE — Progress Notes (Signed)
Patricia Leonard, ?Excellent plan ?We should wait for 6 months after PE for elective colonoscopy.  Can put her on recall ?RG ?

## 2022-01-10 NOTE — Patient Instructions (Addendum)
Will get gastroparesis study this can happen after the NF- wants to schedule 1 month- will call back ?Follow below for diet.  ?Will talk with Dr. Lyndel Safe about any other recommendation.  ? ?Gastroparesis ?Please do small frequent meals like 4-6 meals a day.  ?Eat and drink liquids at separate times.  ?Avoid high fiber foods, cook your vegetables, avoid high fat food.  ?Suggest spreading protein throughout the day (greek yogurt, glucerna, soft meat, milk, eggs) ?Choose soft foods that you can mash with a fork ?When you are more symptomatic, change to pureed foods foods and liquids.  ?Consider reading "Living well with Gastroparesis" by Lambert Keto ?Gastroparesis is a condition in which food takes longer than normal to empty from the stomach. This condition is also known as delayed gastric emptying. It is usually a long-term (chronic) condition. ?There is no cure, but there are treatments and things that you can do at home to help relieve symptoms. Treating the underlying condition that causes gastroparesis can also help relieve symptoms ? ?Abdominal bloating and discomfort may be due to intestinal sensitivity or symptoms of irritable bowel syndrome. To relieve symptoms, avoid:  ?Broccoli  ?Baked beans  ?Cabbage  ?Carbonated drinks  ?Cauliflower  ?Chewing gum  ?Hard candy ?Abdominal distention resulting from weak abdominal muscles:  ?Is better in the morning  ?Gets worse as the day progresses  ?Is relieved by lying down ?Foods to AVOID that are likely to form gas include:  ?Milk, dairy products, and medications that contain lactose--If your body doesn't produce the enzyme (lactase) to break it down.  ?Certain vegetables--baked beans, cauliflower, broccoli, cabbage  ?Certain starches--wheat, oats, corn, potatoes. Rice is a good substitute. ?Identify offending foods. Reduce or eliminate these gas-forming foods from your diet. Can look at the [place].  ?No straw, no carbonated drinks ? ?Will need colonoscopy  but will have to wait until after her PE ?

## 2022-01-17 ENCOUNTER — Ambulatory Visit (INDEPENDENT_AMBULATORY_CARE_PROVIDER_SITE_OTHER): Payer: PPO | Admitting: Family Medicine

## 2022-01-17 ENCOUNTER — Encounter: Payer: Self-pay | Admitting: Family Medicine

## 2022-01-17 VITALS — BP 132/64 | HR 63 | Resp 20 | Ht 66.5 in | Wt 216.0 lb

## 2022-01-17 DIAGNOSIS — J189 Pneumonia, unspecified organism: Secondary | ICD-10-CM

## 2022-01-17 DIAGNOSIS — I2699 Other pulmonary embolism without acute cor pulmonale: Secondary | ICD-10-CM

## 2022-01-17 DIAGNOSIS — I2694 Multiple subsegmental pulmonary emboli without acute cor pulmonale: Secondary | ICD-10-CM | POA: Diagnosis not present

## 2022-01-17 MED ORDER — APIXABAN 5 MG PO TABS: 5.0000 mg | ORAL_TABLET | Freq: Two times a day (BID) | ORAL | 1 refills | Status: DC

## 2022-01-17 NOTE — Progress Notes (Signed)
? ?Subjective:  ?Patient ID: Patricia Leonard, female    DOB: 09-30-1950  Age: 71 y.o. MRN: [id number] ? ?Chief Complaint  ?Patient presents with  ? Pulmonary Emboli 2 week follow up  ? ?HPI ?Patient is a 71 year old white female who presents in follow-up from recent hospitalization.  Patient was admitted on April 6 and discharged on April 7.  Diagnoses include pneumonia, pulmonary embolism. ?Hospital course: Patient is a 71 year old white female who presented with persistent right subscapular/flank pain which has been going on since she had a robotic hiatal hernia surgery 6 weeks prior to presentation.  The pain was intermittent with spikes of severe pain and accompanied by dyspnea on exertion.  Patient had been evaluated 1 week prior to her admission at our office with a CT of the chest that was normal.  Due to the persistent pain she had gone to the emergency department and they performed a CTA of the chest which showed community-acquired pneumonia, bilateral pulmonary embolisms, and bilateral pleural effusions.  CT of abdomen and pelvis was also performed which showed some sigmoid diverticulosis without acute diverticulitis and some aortic atherosclerosis.  Patient received 1 dose of Lovenox in the emergency department and then was subsequently started on Eliquis.  Echocardiogram was essentially normal.  Her CAP was treated with IV Rocephin and Zithromax.  She was discharged on Levaquin.  Patient did improve rapidly following her admission.  Was recommended she stay on Eliquis for at least 3 to 6 months which I would think would be 6 months appropriate since it is a pulmonary embolism.  He did feel that this was secondary to her surgery as a postop complication.  Nicky is definitely feeling much better today.  She still has some cough and some shortness of breath but it is much improved. ? ?Current Outpatient Medications on File Prior to Visit  ?Medication Sig Dispense Refill  ? Biotin 10 MG CAPS Take by mouth.     ? calcium-vitamin D (OSCAL WITH D) 500-200 MG-UNIT TABS tablet Take by mouth.    ? clotrimazole-betamethasone (LOTRISONE) cream Apply topically.    ? cyanocobalamin 1000 MCG tablet Take by mouth.    ? dicyclomine (BENTYL) 10 MG capsule Take 1 capsule (10 mg total) by mouth 3 (three) times daily before meals. As needed 90 capsule 8  ? ketoconazole (NIZORAL) 2 % cream Apply to lower abdominal rash 15 g 0  ? magnesium hydroxide (MILK OF MAGNESIA) 400 MG/5ML suspension Take 15 mLs by mouth daily as needed for mild constipation.    ? pyridoxine (B-6) 100 MG tablet Take by mouth.    ? triamcinolone cream (KENALOG) 0.1 % Apply to right ear rash twice daily 30 g 0  ? ?No current facility-administered medications on file prior to visit.  ? ?Past Medical History:  ?Diagnosis Date  ? Allergy   ? Anemia   ? Anxiety   ? Aortic atherosclerosis ([place])   ? Arthritis   ? Cataract   ? Depression   ? Family history of colon cancer   ? Fibromyalgia   ? GERD (gastroesophageal reflux disease)   ? Hyperlipidemia   ? IBS (irritable bowel syndrome)   ? Rectal prolapse   ? Stomach ulcer   ? Tick bite   ? took antibiotics memorial day weekend 2021 on back  ? ?Past Surgical History:  ?Procedure Laterality Date  ? APPENDECTOMY    ? CESAREAN SECTION    ? CHOLECYSTECTOMY    ? COLONOSCOPY  04/18/2014  ?  Diverticulosis.   ? ESOPHAGOGASTRODUODENOSCOPY  04/14/2016  ? Schatzkis ring. Hiatal hernia. Gastritis.   ? HERNIA REPAIR  11/07/2021  ? LIPOMA EXCISION    ? thermal ablation    ? TONSILLECTOMY    ?  ?Family History  ?Problem Relation Age of Onset  ? Colon cancer Mother   ? Liver cancer Father   ? CAD Father   ? Hypertension Sister   ? Colon cancer Sister   ? Hyperlipidemia Brother   ? Gout Brother   ? Diabetes Maternal Grandmother   ? Diabetes Paternal Grandmother   ? Heart attack Other   ? Cancer Other   ?     Leiomyosarcome and Liver  ? Migraines Other   ? Cancer Maternal Aunt   ?     "female cancer, not breast"  ? Cancer Paternal Aunt   ?      "female cancer, not breast"  ? Colon polyps Neg Hx   ? Esophageal cancer Neg Hx   ? Pancreatic cancer Neg Hx   ? Rectal cancer Neg Hx   ? Stomach cancer Neg Hx   ? ?Social History  ? ?Socioeconomic History  ? Marital status: Widowed  ?  Spouse name: Not on file  ? Number of children: Not on file  ? Years of education: Not on file  ? Highest education level: Not on file  ?Occupational History  ? Not on file  ?Tobacco Use  ? Smoking status: Never  ? Smokeless tobacco: Never  ?Vaping Use  ? Vaping Use: Never used  ?Substance and Sexual Activity  ? Alcohol use: Never  ? Drug use: Never  ? Sexual activity: Not on file  ?Other Topics Concern  ? Not on file  ?Social History Narrative  ? Not on file  ? ?Social Determinants of Health  ? ?Financial Resource Strain: Not on file  ?Food Insecurity: Not on file  ?Transportation Needs: Not on file  ?Physical Activity: Not on file  ?Stress: Not on file  ?Social Connections: Not on file  ? ? ?Review of Systems  ?Constitutional:  Negative for appetite change, fatigue and fever.  ?HENT:  Positive for postnasal drip. Negative for congestion, ear pain, sinus pressure and sore throat.   ?Eyes:  Negative for pain.  ?Respiratory:  Positive for cough and shortness of breath. Negative for wheezing.   ?Cardiovascular:  Negative for chest pain and palpitations.  ?Gastrointestinal:  Positive for nausea. Negative for abdominal pain, constipation, diarrhea and vomiting.  ?Genitourinary:  Negative for dysuria and frequency.  ?Musculoskeletal:  Negative for arthralgias, back pain, joint swelling and myalgias.  ?Skin:  Negative for rash.  ?Neurological:  Negative for dizziness, weakness and headaches.  ?Psychiatric/Behavioral:  Negative for dysphoric mood. The patient is not nervous/anxious.   ? ? ?Objective:  ?BP 132/64   Pulse 63   Resp 20   Ht 5' 6.5" (1.689 m)   Wt 216 lb (98 kg)   SpO2 97%   BMI 34.34 kg/m?  ? ? ?  01/17/2022  ? 12:27 PM 01/17/2022  ? 11:31 AM 01/10/2022  ?  3:25 PM   ?BP/Weight  ?Systolic BP 093 267 124  ?Diastolic BP 64 64 82  ?Wt. (Lbs)  216 214  ?BMI  34.34 kg/m2 34.02 kg/m2  ? ? ?Physical Exam ?Vitals reviewed.  ?Constitutional:   ?   Appearance: Normal appearance. She is normal weight.  ?HENT:  ?   Nose: Congestion present.  ?   Mouth/Throat:  ?  Pharynx: No oropharyngeal exudate or posterior oropharyngeal erythema.  ?Neck:  ?   Vascular: No carotid bruit.  ?Cardiovascular:  ?   Rate and Rhythm: Normal rate and regular rhythm.  ?   Heart sounds: Normal heart sounds.  ?Pulmonary:  ?   Effort: Pulmonary effort is normal.  ?   Breath sounds: Normal breath sounds.  ?Abdominal:  ?   General: Bowel sounds are normal.  ?   Palpations: Abdomen is soft.  ?   Tenderness: There is no abdominal tenderness.  ?Neurological:  ?   Mental Status: She is alert and oriented to person, place, and time. Mental status is at baseline.  ?Psychiatric:     ?   Mood and Affect: Mood normal.     ?   Behavior: Behavior normal.  ? ?Diabetic Foot Exam - Simple   ?No data filed ?  ?  ? ?Lab Results  ?Component Value Date  ? WBC 5.1 12/14/2021  ? HGB 14.5 12/14/2021  ? HCT 43.0 12/14/2021  ? PLT 208 12/14/2021  ? GLUCOSE 98 12/14/2021  ? CHOL 211 (H) 08/05/2021  ? TRIG 94 08/05/2021  ? HDL 58 08/05/2021  ? LDLCALC 136 (H) 08/05/2021  ? ALT 15 12/14/2021  ? AST 24 12/14/2021  ? NA 140 12/14/2021  ? K 4.1 12/14/2021  ? CL 101 12/14/2021  ? CREATININE 0.84 12/14/2021  ? BUN 11 12/14/2021  ? CO2 22 12/14/2021  ? TSH 2.910 08/05/2021  ? ? ? ? ?Assessment & Plan:  ? ?Problem List Items Addressed This Visit   ? ?  ? Cardiovascular and Mediastinum  ? Pulmonary embolus (HCC) - Primary  ?  Continue Eliquis 5 mg twice a day for 6 months. ? ?  ?  ? Relevant Medications  ? apixaban (ELIQUIS) 5 MG TABS tablet  ?  ? Respiratory  ? Community acquired pneumonia of right lower lobe of lung  ?  Completed antibiotic ? ?  ?  ? Community acquired pneumonia of left lower lobe of lung  ?  Completed antibiotic ? ?  ?  ?. ? ?Meds  ordered this encounter  ?Medications  ? apixaban (ELIQUIS) 5 MG TABS tablet  ?  Sig: Take 1 tablet (5 mg total) by mouth 2 (two) times daily.  ?  Dispense:  180 tablet  ?  Refill:  1  ? ?Total time spent on today'

## 2022-01-20 DIAGNOSIS — R197 Diarrhea, unspecified: Secondary | ICD-10-CM | POA: Diagnosis not present

## 2022-01-20 DIAGNOSIS — R519 Headache, unspecified: Secondary | ICD-10-CM | POA: Diagnosis not present

## 2022-01-25 ENCOUNTER — Telehealth: Payer: Self-pay

## 2022-01-25 NOTE — Progress Notes (Signed)
? ? ?Chronic Care Management ?Pharmacy Assistant  ? ?Name: Patricia Leonard  MRN: [id number] DOB: 11-Apr-1951 ? ? ?Reason for Encounter: General Adherence Call  ?  ?Recent office visits:  ?01/17/22 Patricia Brome MD. Seen for Pulmonary Emboli. D/C Methocarbamol '500mg'$ . ? ?01/03/22 Patricia Meeker MD. Seen for Pulmonary Emboli. Ordered Eliquis '5mg'$ . ? ?Recent consult visits:  ?01/10/22 (Gastroenterology) Patricia Mutters PA-C. Seen for abdomina pain. D/C Citalopram Hydrobromide '10mg'$ , Rosuvastatin Calcium '10mg'$ .  ? ?Hospital visits:  ?None ? ?Medications: ?Outpatient Encounter Medications as of 01/25/2022  ?Medication Sig  ? apixaban (ELIQUIS) 5 MG TABS tablet Take 1 tablet (5 mg total) by mouth 2 (two) times daily.  ? Biotin 10 MG CAPS Take by mouth.  ? calcium-vitamin D (OSCAL WITH D) 500-200 MG-UNIT TABS tablet Take by mouth.  ? clotrimazole-betamethasone (LOTRISONE) cream Apply topically.  ? cyanocobalamin 1000 MCG tablet Take by mouth.  ? dicyclomine (BENTYL) 10 MG capsule Take 1 capsule (10 mg total) by mouth 3 (three) times daily before meals. As needed  ? ketoconazole (NIZORAL) 2 % cream Apply to lower abdominal rash  ? magnesium hydroxide (MILK OF MAGNESIA) 400 MG/5ML suspension Take 15 mLs by mouth daily as needed for mild constipation.  ? pyridoxine (B-6) 100 MG tablet Take by mouth.  ? triamcinolone cream (KENALOG) 0.1 % Apply to right ear rash twice daily  ? ?No facility-administered encounter medications on file as of 01/25/2022.  ? ? ?Patricia Leonard for General Review Call ? ? ?Chart Review: ? ?Have there been any documented new, changed, or discontinued medications since last visit? No  ?Has there been any documented recent hospitalizations or ED visits since last visit with Clinical Pharmacist? No ?Brief Summary (including medication and/or Diagnosis changes):None ? ? ?Adherence Review: ? ?Does the Clinical Pharmacist Assistant have access to adherence rates? Yes ?Adherence rates for STAR metric  medications None ?Adherence rates for medications indicated for disease state being reviewed (List medication(s)/day supply/ last 2 fill dates).None ?Does the patient have >5 day gap between last estimated fill dates for any of the above medications or other medication gaps? No ?Reason for medication gaps.None ? ? ?Disease State Questions: ? ?Able to connect with Patient? Yes ?Did patient have any problems with their health recently? Yes ?Note problems and Concerns:Pt sides are hurting her. Pt goes go through spurts with constipation and then gets diarrhea. Pt is taking Miralax to relieve symptoms ?Have you had any admissions or emergency room visits or worsening of your condition(s) since last visit? Yes, pt went in for a headache for 4 days and was worried about blood clots. ?Details of ED visit, hospital visit and/or worsening condition(s):Pt went to [facility] on 01/20/22 for a bad headache. She was worried about blood clots and they ended doing a CT and didn't find anything. They gave her a cocktail and it relieved her symptoms and pt feels good now. ?Have you had any visits with new specialists or providers since your last visit? No ? ?Have you had any new health care problem(s) since your last visit? No ? ?Have you run out of any of your medications since you last spoke with clinical pharmacist? No ? ?Are there any medications you are not taking as prescribed? Yes, she stated her Eliquis accidentally fell out of her hands and into the sink. She said they got wet and ended up having to get a new bottle and felt like she accidentally missed a dose after trying to get back on track  but I advised getting a pill box to organize her meds so she knows what she has taking and what she hasn't for the day. Pt is going to look into getting one and also wants to consider using [facility] to deliver her meds. She stated this is her insurance preferred pharmacy.  ? ?Are you having any issues or side effects with  your medications? No ? ?Do you have any other health concerns or questions you want to discuss with your Clinical Pharmacist before your next visit? No ? ?Are there any health concerns that you feel we can do a better job addressing? No ?. ?Are you having any problems with any of the following since the last visit: (select all that apply) ? None ? Details: ?12. Any falls since last visit? No ? Details: ?13. Any increased or uncontrolled pain since last visit? No ? Details: ?14. Next visit Type: office ?      Visit with:Dr. Cox  ?       Date:05/02/22 ?       Time:8:00am ? ?15. Additional Details? No  ? ? ?Patricia Leonard, CMA ?Clinical Pharmacist Assistant  ?[phone]  ?

## 2022-01-31 DIAGNOSIS — R0981 Nasal congestion: Secondary | ICD-10-CM | POA: Diagnosis not present

## 2022-01-31 DIAGNOSIS — H6091 Unspecified otitis externa, right ear: Secondary | ICD-10-CM | POA: Diagnosis not present

## 2022-01-31 DIAGNOSIS — J0100 Acute maxillary sinusitis, unspecified: Secondary | ICD-10-CM | POA: Diagnosis not present

## 2022-01-31 DIAGNOSIS — H6092 Unspecified otitis externa, left ear: Secondary | ICD-10-CM | POA: Diagnosis not present

## 2022-01-31 DIAGNOSIS — Z20828 Contact with and (suspected) exposure to other viral communicable diseases: Secondary | ICD-10-CM | POA: Diagnosis not present

## 2022-01-31 DIAGNOSIS — R509 Fever, unspecified: Secondary | ICD-10-CM | POA: Diagnosis not present

## 2022-02-02 DIAGNOSIS — J189 Pneumonia, unspecified organism: Secondary | ICD-10-CM | POA: Insufficient documentation

## 2022-02-02 NOTE — Assessment & Plan Note (Signed)
Completed antibiotic 

## 2022-02-02 NOTE — Assessment & Plan Note (Addendum)
Continue Eliquis 5 mg twice a day for 6 months. ?

## 2022-02-08 ENCOUNTER — Telehealth: Payer: Self-pay

## 2022-02-08 ENCOUNTER — Other Ambulatory Visit: Payer: Self-pay

## 2022-02-08 DIAGNOSIS — Z860100 Personal history of colon polyps, unspecified: Secondary | ICD-10-CM

## 2022-02-08 DIAGNOSIS — K581 Irritable bowel syndrome with constipation: Secondary | ICD-10-CM

## 2022-02-08 DIAGNOSIS — K449 Diaphragmatic hernia without obstruction or gangrene: Secondary | ICD-10-CM

## 2022-02-08 DIAGNOSIS — R110 Nausea: Secondary | ICD-10-CM

## 2022-02-08 DIAGNOSIS — R140 Abdominal distension (gaseous): Secondary | ICD-10-CM

## 2022-02-08 NOTE — Telephone Encounter (Signed)
I left a message on both numbers listed in the pts chart requesting her to call the office back to get her appointment rescheduled at an earlier time with Dr. Darryll Capers, or Gay Filler. Waiting for patient to call back. ?

## 2022-02-08 NOTE — Telephone Encounter (Signed)
-----   Message from Vladimir Crofts, [place] sent at 02/07/2022  3:53 PM EDT ----- ?Regarding: FW: Gastroparesis study- call to schedule ?Please schedule for gastric emptying study, see last note.  ?Thanks! ?Estill Bamberg ?----- Message ----- ?From: Vladimir Crofts, PA-C ?Sent: 02/07/2022  12:00 AM EDT ?To: Background Patient List Reminders ?Subject: Gastroparesis study- call to schedule         ? ? ? ?

## 2022-02-08 NOTE — Telephone Encounter (Signed)
-----   Message from Rochel Brome, MD sent at 02/02/2022 11:00 PM EDT ----- ?Regarding: needs appt ?I would like patient to come in for fasting visit or fasting labs with subsequent visit. I was reviewing her chart and I would like to see her sooner than her scheduled appointment. If I do not have anything, I am okay with her seeing sally or shannon, but I will need to speak with them first. She may just wish to see me. Dr Tobie Poet  ? ?

## 2022-02-08 NOTE — Telephone Encounter (Signed)
Left message for patient to call back. Patient needs to be scheduled for GES. ?

## 2022-02-09 NOTE — Telephone Encounter (Signed)
Left message on machine to call back  

## 2022-02-10 NOTE — Progress Notes (Signed)
Recall has been entered  

## 2022-02-10 NOTE — Telephone Encounter (Signed)
Left message on machine to call back  

## 2022-02-13 ENCOUNTER — Other Ambulatory Visit: Payer: Self-pay

## 2022-02-13 DIAGNOSIS — R110 Nausea: Secondary | ICD-10-CM

## 2022-02-13 DIAGNOSIS — K219 Gastro-esophageal reflux disease without esophagitis: Secondary | ICD-10-CM

## 2022-02-13 DIAGNOSIS — R140 Abdominal distension (gaseous): Secondary | ICD-10-CM

## 2022-02-13 DIAGNOSIS — Z86010 Personal history of colonic polyps: Secondary | ICD-10-CM

## 2022-02-13 DIAGNOSIS — K581 Irritable bowel syndrome with constipation: Secondary | ICD-10-CM

## 2022-02-13 NOTE — Telephone Encounter (Signed)
Unable to reach patient x 3. Letter has been sent to patient. Orders have been placed for GES & schedulers notified to contact patient.

## 2022-02-14 ENCOUNTER — Telehealth: Payer: Self-pay

## 2022-02-14 NOTE — Telephone Encounter (Signed)
-----   Message from April H Pait sent at 02/14/2022 11:06 AM EDT -----  ----- Message ----- From: Cathlean Cower Sent: 02/14/2022  10:48 AM EDT To: April H Pait  02/14/22 - patient declined appt.   ----- Message ----- From: Belva Chimes H Sent: 02/13/2022  11:07 AM EDT To: Cathlean Cower   ----- Message ----- From: Carl Best, RN Sent: 02/13/2022  10:16 AM EDT To: April H Pait, Roosvelt Maser  Patient will need to be scheduled for gastric emptying scan. Orders are in. Thank you!

## 2022-02-16 ENCOUNTER — Ambulatory Visit: Payer: PPO | Admitting: Family Medicine

## 2022-02-16 ENCOUNTER — Encounter: Payer: Self-pay | Admitting: Legal Medicine

## 2022-02-16 ENCOUNTER — Ambulatory Visit (INDEPENDENT_AMBULATORY_CARE_PROVIDER_SITE_OTHER): Payer: PPO | Admitting: Legal Medicine

## 2022-02-16 VITALS — BP 130/68 | HR 60 | Temp 96.5°F | Resp 16 | Ht 66.5 in | Wt 211.2 lb

## 2022-02-16 DIAGNOSIS — E782 Mixed hyperlipidemia: Secondary | ICD-10-CM

## 2022-02-16 DIAGNOSIS — F331 Major depressive disorder, recurrent, moderate: Secondary | ICD-10-CM

## 2022-02-16 DIAGNOSIS — I2699 Other pulmonary embolism without acute cor pulmonale: Secondary | ICD-10-CM

## 2022-02-16 DIAGNOSIS — Z6833 Body mass index (BMI) 33.0-33.9, adult: Secondary | ICD-10-CM | POA: Diagnosis not present

## 2022-02-16 NOTE — Progress Notes (Signed)
Subjective:  Patient ID: Patricia Leonard, female    DOB: Jun 07, 1951  Age: 71 y.o. MRN: [id number]  Chief Complaint  Patient presents with   Hypertension   Hyperlipidemia   Gastroesophageal Reflux    HPI: chronic visit   Hypertension: Monitoring bp. Currently is on no medication. Patient presents for follow up of hypertension.  Patient tolerating no medicines well with side effects.  Patient was diagnosed with hypertension 2010 so has been treated for hypertension for 12 years.Patient is working on maintaining diet and exercise regimen and follows up as directed. Complication include none.   Hyperlipidemia:  has not started crestor due to leg cramps.  Last LDL cholesterol 136, she stopped statin pravastatin  GERD:  Well controlled with no medications at this time.  Followed by GI (Dr. Lyndel Safe).  Pulmonary embolus December 29, 2021 on eliquis '5mg'$  bid Current Outpatient Medications on File Prior to Visit  Medication Sig Dispense Refill   apixaban (ELIQUIS) 5 MG TABS tablet Take 1 tablet (5 mg total) by mouth 2 (two) times daily. 180 tablet 1   Biotin 10 MG CAPS Take by mouth.     calcium-vitamin D (OSCAL WITH D) 500-200 MG-UNIT TABS tablet Take by mouth.     clotrimazole-betamethasone (LOTRISONE) cream Apply topically.     cyanocobalamin 1000 MCG tablet Take by mouth.     dicyclomine (BENTYL) 10 MG capsule Take 1 capsule (10 mg total) by mouth 3 (three) times daily before meals. As needed 90 capsule 8   ketoconazole (NIZORAL) 2 % cream Apply to lower abdominal rash 15 g 0   magnesium hydroxide (MILK OF MAGNESIA) 400 MG/5ML suspension Take 15 mLs by mouth daily as needed for mild constipation.     pyridoxine (B-6) 100 MG tablet Take by mouth.     No current facility-administered medications on file prior to visit.   Past Medical History:  Diagnosis Date   Allergy    Anemia    Anxiety    Aortic atherosclerosis (HCC)    Arthritis    Cataract    Depression    Family history of colon  cancer    Fibromyalgia    GERD (gastroesophageal reflux disease)    Hyperlipidemia    IBS (irritable bowel syndrome)    Rectal prolapse    Stomach ulcer    Tick bite    took antibiotics memorial day weekend 2021 on back   Past Surgical History:  Procedure Laterality Date   APPENDECTOMY     CESAREAN SECTION     CHOLECYSTECTOMY     COLONOSCOPY  04/18/2014   Diverticulosis.    ESOPHAGOGASTRODUODENOSCOPY  04/14/2016   Schatzkis ring. Hiatal hernia. Gastritis.    HERNIA REPAIR  11/07/2021   LIPOMA EXCISION     thermal ablation     TONSILLECTOMY      Family History  Problem Relation Age of Onset   Colon cancer Mother    Liver cancer Father    CAD Father    Hypertension Sister    Colon cancer Sister    Hyperlipidemia Brother    Gout Brother    Diabetes Maternal Grandmother    Diabetes Paternal Grandmother    Heart attack Other    Cancer Other        Leiomyosarcome and Liver   Migraines Other    Cancer Maternal Aunt        "female cancer, not breast"   Cancer Paternal Aunt        "female cancer, not  breast"   Colon polyps Neg Hx    Esophageal cancer Neg Hx    Pancreatic cancer Neg Hx    Rectal cancer Neg Hx    Stomach cancer Neg Hx    Social History   Socioeconomic History   Marital status: Widowed    Spouse name: Not on file   Number of children: Not on file   Years of education: Not on file   Highest education level: Not on file  Occupational History   Not on file  Tobacco Use   Smoking status: Never   Smokeless tobacco: Never  Vaping Use   Vaping Use: Never used  Substance and Sexual Activity   Alcohol use: Never   Drug use: Never   Sexual activity: Not on file  Other Topics Concern   Not on file  Social History Narrative   Not on file   Social Determinants of Health   Financial Resource Strain: Not on file  Food Insecurity: Not on file  Transportation Needs: Not on file  Physical Activity: Not on file  Stress: Not on file  Social  Connections: Not on file    Review of Systems   Objective:  BP 130/68   Pulse 60   Temp (!) 96.5 F (35.8 C)   Resp 16   Ht 5' 6.5" (1.689 m)   Wt 211 lb 3.2 oz (95.8 kg)   BMI 33.58 kg/m      02/16/2022    8:14 AM 01/17/2022   12:27 PM 01/17/2022   11:31 AM  BP/Weight  Systolic BP 175 102 585  Diastolic BP 68 64 64  Wt. (Lbs) 211.2  216  BMI 33.58 kg/m2  34.34 kg/m2    Physical Exam Vitals reviewed.  Constitutional:      General: She is not in acute distress.    Appearance: Normal appearance.  HENT:     Right Ear: Tympanic membrane normal.     Left Ear: Tympanic membrane normal.     Nose: Nose normal.     Mouth/Throat:     Mouth: Mucous membranes are moist.     Pharynx: Oropharynx is clear.  Eyes:     Extraocular Movements: Extraocular movements intact.     Conjunctiva/sclera: Conjunctivae normal.     Pupils: Pupils are equal, round, and reactive to light.  Cardiovascular:     Rate and Rhythm: Normal rate and regular rhythm.     Pulses: Normal pulses.     Heart sounds: Normal heart sounds. No murmur heard.   No gallop.  Pulmonary:     Effort: Pulmonary effort is normal. No respiratory distress.     Breath sounds: Normal breath sounds. No wheezing.  Abdominal:     General: Abdomen is flat. Bowel sounds are normal. There is no distension.     Tenderness: There is no abdominal tenderness.  Musculoskeletal:        General: Normal range of motion.     Cervical back: Normal range of motion and neck supple.     Right lower leg: No edema.     Left lower leg: No edema.  Skin:    General: Skin is warm.     Capillary Refill: Capillary refill takes less than 2 seconds.  Neurological:     General: No focal deficit present.     Mental Status: She is alert and oriented to person, place, and time. Mental status is at baseline.  Psychiatric:        Mood and Affect:  Mood normal.        Thought Content: Thought content normal.        Lab Results  Component Value  Date   WBC 5.1 12/14/2021   HGB 14.5 12/14/2021   HCT 43.0 12/14/2021   PLT 208 12/14/2021   GLUCOSE 98 12/14/2021   CHOL 211 (H) 08/05/2021   TRIG 94 08/05/2021   HDL 58 08/05/2021   LDLCALC 136 (H) 08/05/2021   ALT 15 12/14/2021   AST 24 12/14/2021   NA 140 12/14/2021   K 4.1 12/14/2021   CL 101 12/14/2021   CREATININE 0.84 12/14/2021   BUN 11 12/14/2021   CO2 22 12/14/2021   TSH 2.910 08/05/2021      01/03/2022   10:59 AM 08/05/2021    7:38 AM 06/27/2021    9:38 AM 01/19/2021    4:03 PM 11/16/2020    9:30 AM  Depression screen PHQ 2/9  Decreased Interest 0 1 0 0 0  Down, Depressed, Hopeless 0 2 0 0 0  PHQ - 2 Score 0 3 0 0 0  Altered sleeping  3     Tired, decreased energy  2     Change in appetite  2     Feeling bad or failure about yourself   2     Trouble concentrating  1     Moving slowly or fidgety/restless  0     Suicidal thoughts  1     PHQ-9 Score  14          Assessment & Plan:   Problem List Items Addressed This Visit       Cardiovascular and Mediastinum   Pulmonary embolus ([place])   Relevant Orders   CBC with Differential/Platelet   Comprehensive metabolic panel Patient continues to be on Eliquis for pulmonary embolism and is breathing well has had no bleeding episodes.     Other   Mixed hyperlipidemia - Primary   Relevant Orders   CBC with Differential/Platelet   Comprehensive metabolic panel   Lipid panel AN INDIVIDUAL CARE PLAN for hyperlipidemia/ cholesterol was established and reinforced today.  The patient's status was assessed using clinical findings on exam, lab and other diagnostic tests. The patient's disease status was assessed based on evidence-based guidelines and found to be fair controlled. MEDICATIONS were reviewed. SELF MANAGEMENT GOALS have been discussed and patient's success at attaining the goal of low cholesterol was assessed. RECOMMENDATION given include regular exercise 3 days a week and low cholesterol/low fat  diet. CLINICAL SUMMARY including written plan to identify barriers unique to the patient due to social or economic  reasons was discussed.    Moderate recurrent major depression ([place]) Patient is showing minimal depression at the present timeShe is presently on no medications.    BMI 33.0-33.9,adult An individualize plan was formulated for obesity using patient history and physical exam to encourage weight loss.  An evidence based program was formulated.  Patient is to cut portion size with meals and to plan physical exercise 3 days a week at least 20 minutes.  Weight watchers and other programs are helpful.  Planned amount of weight loss 10 lbs.   .    Orders Placed This Encounter  Procedures   CBC with Differential/Platelet   Comprehensive metabolic panel   Lipid panel     Follow-up: Return in about 3 months (around 05/19/2022).  An After Visit Summary was printed and given to the patient.  Reinaldo Meeker, MD [facility] [phone]

## 2022-02-17 LAB — COMPREHENSIVE METABOLIC PANEL
ALT: 21 IU/L (ref 0–32)
AST: 27 IU/L (ref 0–40)
Albumin/Globulin Ratio: 1.6 (ref 1.2–2.2)
Albumin: 4.2 g/dL (ref 3.7–4.7)
Alkaline Phosphatase: 95 IU/L (ref 44–121)
BUN/Creatinine Ratio: 16 (ref 12–28)
BUN: 12 mg/dL (ref 8–27)
Bilirubin Total: 0.5 mg/dL (ref 0.0–1.2)
CO2: 23 mmol/L (ref 20–29)
Calcium: 9.4 mg/dL (ref 8.7–10.3)
Chloride: 103 mmol/L (ref 96–106)
Creatinine, Ser: 0.73 mg/dL (ref 0.57–1.00)
Globulin, Total: 2.6 g/dL (ref 1.5–4.5)
Glucose: 92 mg/dL (ref 70–99)
Potassium: 4.3 mmol/L (ref 3.5–5.2)
Sodium: 140 mmol/L (ref 134–144)
Total Protein: 6.8 g/dL (ref 6.0–8.5)
eGFR: 87 mL/min/{1.73_m2} (ref >59)

## 2022-02-17 LAB — CBC WITH DIFFERENTIAL/PLATELET
Basophils Absolute: 0 10*3/uL (ref 0.0–0.2)
Basos: 1 %
EOS (ABSOLUTE): 0.1 10*3/uL (ref 0.0–0.4)
Eos: 3 %
Hematocrit: 42.5 % (ref 34.0–46.6)
Hemoglobin: 14.3 g/dL (ref 11.1–15.9)
Immature Grans (Abs): 0 10*3/uL (ref 0.0–0.1)
Immature Granulocytes: 0 %
Lymphocytes Absolute: 0.9 10*3/uL (ref 0.7–3.1)
Lymphs: 31 %
MCH: 29.3 pg (ref 26.6–33.0)
MCHC: 33.6 g/dL (ref 31.5–35.7)
MCV: 87 fL (ref 79–97)
Monocytes Absolute: 0.2 10*3/uL (ref 0.1–0.9)
Monocytes: 7 %
Neutrophils Absolute: 1.7 10*3/uL (ref 1.4–7.0)
Neutrophils: 58 %
Platelets: 255 10*3/uL (ref 150–450)
RBC: 4.88 x10E6/uL (ref 3.77–5.28)
RDW: 12.8 % (ref 11.7–15.4)
WBC: 3 10*3/uL — ABNORMAL LOW (ref 3.4–10.8)

## 2022-02-17 LAB — LIPID PANEL
Chol/HDL Ratio: 4 ratio (ref 0.0–4.4)
Cholesterol, Total: 194 mg/dL (ref 100–199)
HDL: 49 mg/dL (ref >39)
LDL Chol Calc (NIH): 125 mg/dL — ABNORMAL HIGH (ref 0–99)
Triglycerides: 110 mg/dL (ref 0–149)
VLDL Cholesterol Cal: 20 mg/dL (ref 5–40)

## 2022-02-17 LAB — CARDIOVASCULAR RISK ASSESSMENT

## 2022-02-17 NOTE — Progress Notes (Signed)
WBC 3.0 low, recheck one month, LDL cholesterol 125 high, need to consider a statin, Kidney tests normal, liver tests normal,  lp

## 2022-02-23 ENCOUNTER — Other Ambulatory Visit: Payer: Self-pay

## 2022-02-23 DIAGNOSIS — R7989 Other specified abnormal findings of blood chemistry: Secondary | ICD-10-CM

## 2022-03-01 ENCOUNTER — Telehealth: Payer: Self-pay

## 2022-03-01 NOTE — Progress Notes (Signed)
Chronic Care Management Pharmacy Assistant   Name: Patricia Leonard  MRN: [id number] DOB: 06-14-1951   Reason for Encounter: General Adherence Call    Recent office visits:  02/16/22 Reinaldo Meeker MD. Seen for Routine Visit. No med changes.   Recent consult visits:  None  Hospital visits:  None  Medications: Outpatient Encounter Medications as of 03/01/2022  Medication Sig   apixaban (ELIQUIS) 5 MG TABS tablet Take 1 tablet (5 mg total) by mouth 2 (two) times daily.   Biotin 10 MG CAPS Take by mouth.   calcium-vitamin D (OSCAL WITH D) 500-200 MG-UNIT TABS tablet Take by mouth.   clotrimazole-betamethasone (LOTRISONE) cream Apply topically.   cyanocobalamin 1000 MCG tablet Take by mouth.   dicyclomine (BENTYL) 10 MG capsule Take 1 capsule (10 mg total) by mouth 3 (three) times daily before meals. As needed   ketoconazole (NIZORAL) 2 % cream Apply to lower abdominal rash   magnesium hydroxide (MILK OF MAGNESIA) 400 MG/5ML suspension Take 15 mLs by mouth daily as needed for mild constipation.   pyridoxine (B-6) 100 MG tablet Take by mouth.   No facility-administered encounter medications on file as of 03/01/2022.    [place] for General Review Call   Chart Review:  Have there been any documented new, changed, or discontinued medications since last visit? No  Has there been any documented recent hospitalizations or ED visits since last visit with Clinical Pharmacist? No Brief Summary (including medication and/or Diagnosis changes):None noted    Adherence Review:  Does the Clinical Pharmacist Assistant have access to adherence rates? Yes Adherence rates for STAR metric medications (List medication(s)/day supply/ last 2 fill dates).None Adherence rates for medications indicated for disease state being reviewed (List medication(s)/day supply/ last 2 fill dates). Does the patient have >5 day gap between last estimated fill dates for any of the above  medications or other medication gaps? No  Disease State Questions:  Able to connect with Patient? Yes Did patient have any problems with their health recently? No Note problems and Concerns: Have you had any admissions or emergency room visits or worsening of your condition(s) since last visit? No Details of ED visit, hospital visit and/or worsening condition(s): Have you had any visits with new specialists or providers since your last visit? No Explain: Have you had any new health care problem(s) since your last visit? No New problem(s) reported: Have you run out of any of your medications since you last spoke with clinical pharmacist? No What caused you to run out of your medications? Are there any medications you are not taking as prescribed? Yes, pt will forget to take her Eliquis first thing in the morning and will take it at lunch time. She was worried about taking her two doses close together. She stated she watches her grandson on the mornings and will just forget. She will bring this up to the provider at her appt on 03/08/22. What kept you from taking your medications as prescribed? Are you having any issues or side effects with your medications? Yes Note of issues or side effects: Pt is concerned about being on the blood thinner Eliquis because she has been having headaches. She isn't sure if its because the medication or just allergies. She gets headaches on occassions. She denies increased blurry vision. She does get dizzy sometimes if she gets up too fast and seems to go to the left. She stated her left foot is swollen right now. She sees the  Dr this week to get checked.  Do you have any other health concerns or questions you want to discuss with your Clinical Pharmacist before your next visit? No Note additional concerns and questions from Patient. Are there any health concerns that you feel we can do a better job addressing? No Note Patient's response. Are you having any  problems with any of the following since the last visit: (select all that apply)  None  Details: 12. Any falls since last visit? No  Details: 13. Any increased or uncontrolled pain since last visit? No  Details: 14. Next visit Type: office       Visit with: Shelle Iron LPN        GBTD:1/76/16        Time:2:00pm  15. Additional Details? No    Elray Mcgregor, [facility] Catering manager  [phone]

## 2022-03-08 ENCOUNTER — Ambulatory Visit (INDEPENDENT_AMBULATORY_CARE_PROVIDER_SITE_OTHER): Payer: PPO

## 2022-03-13 ENCOUNTER — Telehealth: Payer: Self-pay

## 2022-03-13 NOTE — Telephone Encounter (Signed)
Recall has been placed for 6 month colon & OV that will be needed prior to scheduling.

## 2022-03-16 ENCOUNTER — Ambulatory Visit: Payer: PPO

## 2022-03-16 ENCOUNTER — Other Ambulatory Visit: Payer: Self-pay

## 2022-03-16 DIAGNOSIS — R7989 Other specified abnormal findings of blood chemistry: Secondary | ICD-10-CM

## 2022-03-16 LAB — CBC WITH DIFF/PLATELET
Basophils Absolute: 0 10*3/uL (ref 0.0–0.2)
Basos: 1 %
EOS (ABSOLUTE): 0.1 10*3/uL (ref 0.0–0.4)
Eos: 2 %
Hematocrit: 40.8 % (ref 34.0–46.6)
Hemoglobin: 13.7 g/dL (ref 11.1–15.9)
Immature Grans (Abs): 0 10*3/uL (ref 0.0–0.1)
Immature Granulocytes: 0 %
Lymphocytes Absolute: 1.4 10*3/uL (ref 0.7–3.1)
Lymphs: 30 %
MCH: 29 pg (ref 26.6–33.0)
MCHC: 33.6 g/dL (ref 31.5–35.7)
MCV: 86 fL (ref 79–97)
Monocytes Absolute: 0.3 10*3/uL (ref 0.1–0.9)
Monocytes: 6 %
Neutrophils Absolute: 2.8 10*3/uL (ref 1.4–7.0)
Neutrophils: 61 %
Platelets: 210 10*3/uL (ref 150–450)
RBC: 4.73 x10E6/uL (ref 3.77–5.28)
RDW: 13.1 % (ref 11.7–15.4)
WBC: 4.5 10*3/uL (ref 3.4–10.8)

## 2022-03-29 DIAGNOSIS — Z79899 Other long term (current) drug therapy: Secondary | ICD-10-CM | POA: Diagnosis not present

## 2022-03-29 DIAGNOSIS — R002 Palpitations: Secondary | ICD-10-CM | POA: Diagnosis not present

## 2022-03-29 DIAGNOSIS — R5381 Other malaise: Secondary | ICD-10-CM | POA: Diagnosis not present

## 2022-03-29 DIAGNOSIS — I358 Other nonrheumatic aortic valve disorders: Secondary | ICD-10-CM | POA: Diagnosis not present

## 2022-03-29 DIAGNOSIS — I471 Supraventricular tachycardia: Secondary | ICD-10-CM | POA: Diagnosis not present

## 2022-03-29 DIAGNOSIS — R5383 Other fatigue: Secondary | ICD-10-CM | POA: Diagnosis not present

## 2022-03-29 DIAGNOSIS — E559 Vitamin D deficiency, unspecified: Secondary | ICD-10-CM | POA: Diagnosis not present

## 2022-04-20 ENCOUNTER — Encounter: Payer: Self-pay | Admitting: Physician Assistant

## 2022-05-02 ENCOUNTER — Ambulatory Visit: Payer: PPO | Admitting: Family Medicine

## 2022-05-23 ENCOUNTER — Encounter: Payer: Self-pay | Admitting: Family Medicine

## 2022-05-23 ENCOUNTER — Ambulatory Visit (INDEPENDENT_AMBULATORY_CARE_PROVIDER_SITE_OTHER): Payer: PPO | Admitting: Family Medicine

## 2022-05-23 VITALS — BP 124/82 | HR 56 | Temp 97.2°F | Ht 66.5 in | Wt 210.0 lb

## 2022-05-23 DIAGNOSIS — T466X5A Adverse effect of antihyperlipidemic and antiarteriosclerotic drugs, initial encounter: Secondary | ICD-10-CM | POA: Diagnosis not present

## 2022-05-23 DIAGNOSIS — F331 Major depressive disorder, recurrent, moderate: Secondary | ICD-10-CM | POA: Diagnosis not present

## 2022-05-23 DIAGNOSIS — K219 Gastro-esophageal reflux disease without esophagitis: Secondary | ICD-10-CM | POA: Diagnosis not present

## 2022-05-23 DIAGNOSIS — M791 Myalgia, unspecified site: Secondary | ICD-10-CM

## 2022-05-23 DIAGNOSIS — K581 Irritable bowel syndrome with constipation: Secondary | ICD-10-CM

## 2022-05-23 DIAGNOSIS — E782 Mixed hyperlipidemia: Secondary | ICD-10-CM | POA: Diagnosis not present

## 2022-05-23 DIAGNOSIS — I700 Atherosclerosis of aorta: Secondary | ICD-10-CM | POA: Diagnosis not present

## 2022-05-23 DIAGNOSIS — Z86711 Personal history of pulmonary embolism: Secondary | ICD-10-CM | POA: Diagnosis not present

## 2022-05-23 NOTE — Progress Notes (Signed)
Subjective:  Patient ID: Patricia Leonard, female    DOB: 19-May-1951  Age: 71 y.o. MRN: [id number]  Chief Complaint  Patient presents with   Hyperlipidemia   HPI: Hyperlipidemia:  Patient is currently not taking any medication for this. Patient's last labs were done 02/16/2022 with LDL of 125.  Pulmonary Embolism: Patient is currently taking Eliquis 5 mg take 1 tablet by mouth twice daily.  IBS with Constipation: Patient is currently taking Bentyl 10 mg take 1 capsule 3 times daily PRN stomach cramps/pain.  Hiatal Hernia: emergency surgery in 10/2021. Has not had follow up postop visit due to surgeon moving out of area. Patient is having persistent reflux. Patient has appt with Dr. Lyndel Safe in 05/2022. Patient is no longer on ppi.  Patient has dumping syndrome after certain foods. Sometimes when has taken omeprazole  Takes pepto bismol all the time. Has lots of nausea, belching, and rumbling. Painful at times. She is very miserable. Lost 226 lb down to 210 lb. Patient has had right sided abdominal pain and right flank pain. Dicyclomine helps. Pain is fairly constant although waxes and wanes. Seems to not have a trigger. Is not triggered by ingestion of food. Has nauseated. No vomiting.      05/23/2022    9:57 AM 01/03/2022   10:59 AM 08/05/2021    7:38 AM  PHQ9 SCORE ONLY  PHQ-9 Total Score 2 0 14      Current Outpatient Medications on File Prior to Visit  Medication Sig Dispense Refill   apixaban (ELIQUIS) 5 MG TABS tablet Take 1 tablet (5 mg total) by mouth 2 (two) times daily. 180 tablet 1   Biotin 10 MG CAPS Take by mouth.     calcium-vitamin D (OSCAL WITH D) 500-200 MG-UNIT TABS tablet Take by mouth.     clotrimazole-betamethasone (LOTRISONE) cream Apply topically.     cyanocobalamin 1000 MCG tablet Take by mouth.     dicyclomine (BENTYL) 10 MG capsule Take 1 capsule (10 mg total) by mouth 3 (three) times daily before meals. As needed 90 capsule 8   ketoconazole (NIZORAL) 2 %  cream Apply to lower abdominal rash 15 g 0   magnesium hydroxide (MILK OF MAGNESIA) 400 MG/5ML suspension Take 15 mLs by mouth daily as needed for mild constipation.     pyridoxine (B-6) 100 MG tablet Take by mouth.     No current facility-administered medications on file prior to visit.   Past Medical History:  Diagnosis Date   Allergy    Anemia    Anxiety    Aortic atherosclerosis (HCC)    Arthritis    Cataract    Depression    Family history of colon cancer    Fibromyalgia    GERD (gastroesophageal reflux disease)    Hyperlipidemia    IBS (irritable bowel syndrome)    Rectal prolapse    Stomach ulcer    Tick bite    took antibiotics memorial day weekend 2021 on back   Past Surgical History:  Procedure Laterality Date   APPENDECTOMY     CESAREAN SECTION     CHOLECYSTECTOMY     COLONOSCOPY  04/18/2014   Diverticulosis.    ESOPHAGOGASTRODUODENOSCOPY  04/14/2016   Schatzkis ring. Hiatal hernia. Gastritis.    HERNIA REPAIR  11/07/2021   Hiatal   LIPOMA EXCISION     thermal ablation     TONSILLECTOMY      Family History  Problem Relation Age of Onset   Colon cancer  Mother    Liver cancer Father    CAD Father    Hypertension Sister    Colon cancer Sister    Hyperlipidemia Brother    Gout Brother    Diabetes Maternal Grandmother    Diabetes Paternal Grandmother    Heart attack Other    Cancer Other        Leiomyosarcome and Liver   Migraines Other    Cancer Maternal Aunt        "female cancer, not breast"   Cancer Paternal Aunt        "female cancer, not breast"   Colon polyps Neg Hx    Esophageal cancer Neg Hx    Pancreatic cancer Neg Hx    Rectal cancer Neg Hx    Stomach cancer Neg Hx    Social History   Socioeconomic History   Marital status: Widowed    Spouse name: Not on file   Number of children: Not on file   Years of education: Not on file   Highest education level: Not on file  Occupational History   Not on file  Tobacco Use   Smoking  status: Never   Smokeless tobacco: Never  Vaping Use   Vaping Use: Never used  Substance and Sexual Activity   Alcohol use: Never   Drug use: Never   Sexual activity: Not on file  Other Topics Concern   Not on file  Social History Narrative   Not on file   Social Determinants of Health   Financial Resource Strain: Not on file  Food Insecurity: No Food Insecurity (11/08/2020)   Hunger Vital Sign    Worried About Running Out of Food in the Last Year: Never true    Ran Out of Food in the Last Year: Never true  Transportation Needs: No Transportation Needs (11/08/2020)   PRAPARE - Hydrologist (Medical): No    Lack of Transportation (Non-Medical): No  Physical Activity: Not on file  Stress: Not on file  Social Connections: Not on file    Review of Systems  Constitutional:  Negative for appetite change, fatigue and fever.  HENT:  Negative for congestion, ear pain, sinus pressure and sore throat.   Respiratory:  Negative for cough, chest tightness, shortness of breath and wheezing.   Cardiovascular:  Negative for chest pain and palpitations.  Gastrointestinal:  Negative for abdominal pain, constipation, diarrhea, nausea and vomiting.  Genitourinary:  Negative for dysuria and hematuria.  Musculoskeletal:  Positive for back pain (Right side Flank pain- patient states she has still had this ever since having pneumonia.). Negative for arthralgias, joint swelling and myalgias.  Skin:  Negative for rash.  Neurological:  Negative for dizziness, weakness and headaches.  Psychiatric/Behavioral:  Negative for dysphoric mood. The patient is not nervous/anxious.      Objective:  BP 124/82 (BP Location: Left Arm, Patient Position: Sitting)   Pulse (!) 56   Temp (!) 97.2 F (36.2 C) (Temporal)   Ht 5' 6.5" (1.689 m)   Wt 210 lb (95.3 kg)   SpO2 97%   BMI 33.39 kg/m      05/25/2022    1:52 PM 05/23/2022   10:08 AM 02/16/2022    8:14 AM  BP/Weight   Systolic BP 509 326 712  Diastolic BP 72 82 68  Wt. (Lbs) 209.2 210 211.2  BMI 33.26 kg/m2 33.39 kg/m2 33.58 kg/m2    Physical Exam Vitals reviewed.  Constitutional:  Appearance: Normal appearance. She is normal weight.  Cardiovascular:     Rate and Rhythm: Normal rate and regular rhythm.     Heart sounds: Normal heart sounds.  Pulmonary:     Effort: Pulmonary effort is normal.     Breath sounds: Normal breath sounds.  Abdominal:     General: Abdomen is flat. Bowel sounds are normal. There is no distension.     Palpations: Abdomen is soft.     Tenderness: There is abdominal tenderness (right lateral abdomen upper .).  Neurological:     Mental Status: She is alert and oriented to person, place, and time.  Psychiatric:        Mood and Affect: Mood normal.        Behavior: Behavior normal.    Diabetic Foot Exam - Simple   No data filed      Lab Results  Component Value Date   WBC 4.0 05/23/2022   HGB 14.4 05/23/2022   HCT 42.8 05/23/2022   PLT 212 05/23/2022   GLUCOSE 95 05/23/2022   CHOL 216 (H) 05/23/2022   TRIG 132 05/23/2022   HDL 53 05/23/2022   LDLCALC 139 (H) 05/23/2022   ALT 21 05/23/2022   AST 24 05/23/2022   NA 141 05/23/2022   K 4.5 05/23/2022   CL 104 05/23/2022   CREATININE 0.83 05/23/2022   BUN 14 05/23/2022   CO2 25 05/23/2022   TSH 2.910 08/05/2021      Assessment & Plan:   Problem List Items Addressed This Visit       Cardiovascular and Mediastinum   Aortic atherosclerosis ([place])    Recommend statin. Patient refused due to myalgias.       Pulmonary embolus (HCC)    Continue eliquis 5 mg twice daily.         Digestive   GERD (gastroesophageal reflux disease)    Start on pepcid twice daily.  Keep appt with GI>      Irritable bowel syndrome with constipation    Continue bentyl before meals and before bed.         Other   Mixed hyperlipidemia - Primary    Not at goal. Refuses statins.  Continue to work on eating a  healthy diet and exercise.  Labs drawn today.        Relevant Orders   CBC with Differential/Platelet (Completed)   Comprehensive metabolic panel (Completed)   Lipid panel (Completed)   Myalgia due to statin    Intolerant to statins.       RESOLVED: Moderate recurrent major depression ([place])  .  No orders of the defined types were placed in this encounter.   Orders Placed This Encounter  Procedures   CBC with Differential/Platelet   Comprehensive metabolic panel   Lipid panel     Follow-up: Return in about 4 months (around 09/22/2022) for chronic fasting.  An After Visit Summary was printed and given to the patient.   I,Lauren M Auman,acting as a scribe for Rochel Brome, MD.,have documented all relevant documentation on the behalf of Rochel Brome, MD,as directed by  Rochel Brome, MD while in the presence of Rochel Brome, MD.    Rochel Brome, MD [place] [phone]

## 2022-05-23 NOTE — Patient Instructions (Signed)
Pepcid 20 mg one twice a day.

## 2022-05-24 LAB — CBC WITH DIFFERENTIAL/PLATELET
Basophils Absolute: 0 10*3/uL (ref 0.0–0.2)
Basos: 1 %
EOS (ABSOLUTE): 0.1 10*3/uL (ref 0.0–0.4)
Eos: 1 %
Hematocrit: 42.8 % (ref 34.0–46.6)
Hemoglobin: 14.4 g/dL (ref 11.1–15.9)
Immature Grans (Abs): 0 10*3/uL (ref 0.0–0.1)
Immature Granulocytes: 0 %
Lymphocytes Absolute: 1.1 10*3/uL (ref 0.7–3.1)
Lymphs: 27 %
MCH: 29.6 pg (ref 26.6–33.0)
MCHC: 33.6 g/dL (ref 31.5–35.7)
MCV: 88 fL (ref 79–97)
Monocytes Absolute: 0.3 10*3/uL (ref 0.1–0.9)
Monocytes: 7 %
Neutrophils Absolute: 2.6 10*3/uL (ref 1.4–7.0)
Neutrophils: 64 %
Platelets: 212 10*3/uL (ref 150–450)
RBC: 4.87 x10E6/uL (ref 3.77–5.28)
RDW: 13.4 % (ref 11.7–15.4)
WBC: 4 10*3/uL (ref 3.4–10.8)

## 2022-05-24 LAB — COMPREHENSIVE METABOLIC PANEL
ALT: 21 IU/L (ref 0–32)
AST: 24 IU/L (ref 0–40)
Albumin/Globulin Ratio: 1.5 (ref 1.2–2.2)
Albumin: 4.1 g/dL (ref 3.8–4.8)
Alkaline Phosphatase: 101 IU/L (ref 44–121)
BUN/Creatinine Ratio: 17 (ref 12–28)
BUN: 14 mg/dL (ref 8–27)
Bilirubin Total: 0.6 mg/dL (ref 0.0–1.2)
CO2: 25 mmol/L (ref 20–29)
Calcium: 9.4 mg/dL (ref 8.7–10.3)
Chloride: 104 mmol/L (ref 96–106)
Creatinine, Ser: 0.83 mg/dL (ref 0.57–1.00)
Globulin, Total: 2.7 g/dL (ref 1.5–4.5)
Glucose: 95 mg/dL (ref 70–99)
Potassium: 4.5 mmol/L (ref 3.5–5.2)
Sodium: 141 mmol/L (ref 134–144)
Total Protein: 6.8 g/dL (ref 6.0–8.5)
eGFR: 74 mL/min/{1.73_m2} (ref >59)

## 2022-05-24 LAB — LIPID PANEL
Chol/HDL Ratio: 4.1 ratio (ref 0.0–4.4)
Cholesterol, Total: 216 mg/dL — ABNORMAL HIGH (ref 100–199)
HDL: 53 mg/dL (ref >39)
LDL Chol Calc (NIH): 139 mg/dL — ABNORMAL HIGH (ref 0–99)
Triglycerides: 132 mg/dL (ref 0–149)
VLDL Cholesterol Cal: 24 mg/dL (ref 5–40)

## 2022-05-25 ENCOUNTER — Ambulatory Visit (INDEPENDENT_AMBULATORY_CARE_PROVIDER_SITE_OTHER): Payer: PPO | Admitting: Family Medicine

## 2022-05-25 VITALS — BP 118/72 | HR 67 | Temp 97.6°F | Wt 209.2 lb

## 2022-05-25 DIAGNOSIS — B372 Candidiasis of skin and nail: Secondary | ICD-10-CM | POA: Diagnosis not present

## 2022-05-25 DIAGNOSIS — R300 Dysuria: Secondary | ICD-10-CM | POA: Diagnosis not present

## 2022-05-25 DIAGNOSIS — N3000 Acute cystitis without hematuria: Secondary | ICD-10-CM | POA: Diagnosis not present

## 2022-05-25 LAB — POCT URINALYSIS DIP (CLINITEK)
Bilirubin, UA: NEGATIVE
Blood, UA: NEGATIVE
Glucose, UA: NEGATIVE mg/dL
Ketones, POC UA: NEGATIVE mg/dL
Nitrite, UA: NEGATIVE
POC PROTEIN,UA: NEGATIVE
Spec Grav, UA: 1.01 (ref 1.010–1.025)
Urobilinogen, UA: 0.2 E.U./dL
pH, UA: 7.5 (ref 5.0–8.0)

## 2022-05-25 MED ORDER — SULFAMETHOXAZOLE-TRIMETHOPRIM 800-160 MG PO TABS: 1.0000 | ORAL_TABLET | Freq: Two times a day (BID) | ORAL | 0 refills | Status: DC

## 2022-05-25 MED ORDER — FLUCONAZOLE 150 MG PO TABS: 150.0000 mg | ORAL_TABLET | Freq: Every day | ORAL | 0 refills | Status: DC

## 2022-05-25 NOTE — Assessment & Plan Note (Addendum)
Ordered Urine culture. Bactrim DS 1 PO BID for 7 days

## 2022-05-25 NOTE — Assessment & Plan Note (Addendum)
Diflucan 100 mg QD for 7 days

## 2022-05-25 NOTE — Patient Instructions (Addendum)
For UTI start Bacterium DS 1 tablet by mouth twice daily for 7 day  For yeast under breast take Diflucan 100 mg once a day for 7 days, also try interdry.

## 2022-05-25 NOTE — Assessment & Plan Note (Signed)
Ordered UA

## 2022-05-25 NOTE — Progress Notes (Signed)
Acute Office Visit  Subjective:    Patient ID: Patricia Leonard, female    DOB: 1951/02/24, 71 y.o.   MRN: [id number]  Chief Complaint  Patient presents with   Urinary Tract Infection    HPI: Patient is in today for c/o of lower abdominal pain, right side flank tenderness, itchy throat, headache, and dizziness. Patient stated she saw blood in urine. Patient also has yeast under both breast.  Past Medical History:  Diagnosis Date   Allergy    Anemia    Anxiety    Aortic atherosclerosis ([place])    Arthritis    Cataract    Depression    Family history of colon cancer    Fibromyalgia    GERD (gastroesophageal reflux disease)    Hyperlipidemia    IBS (irritable bowel syndrome)    Rectal prolapse    Stomach ulcer    Tick bite    took antibiotics memorial day weekend 2021 on back    Past Surgical History:  Procedure Laterality Date   APPENDECTOMY     CESAREAN SECTION     CHOLECYSTECTOMY     COLONOSCOPY  04/18/2014   Diverticulosis.    ESOPHAGOGASTRODUODENOSCOPY  04/14/2016   Schatzkis ring. Hiatal hernia. Gastritis.    HERNIA REPAIR  11/07/2021   Hiatal   LIPOMA EXCISION     thermal ablation     TONSILLECTOMY      Family History  Problem Relation Age of Onset   Colon cancer Mother    Liver cancer Father    CAD Father    Hypertension Sister    Colon cancer Sister    Hyperlipidemia Brother    Gout Brother    Diabetes Maternal Grandmother    Diabetes Paternal Grandmother    Heart attack Other    Cancer Other        Leiomyosarcome and Liver   Migraines Other    Cancer Maternal Aunt        "female cancer, not breast"   Cancer Paternal Aunt        "female cancer, not breast"   Colon polyps Neg Hx    Esophageal cancer Neg Hx    Pancreatic cancer Neg Hx    Rectal cancer Neg Hx    Stomach cancer Neg Hx     Social History   Socioeconomic History   Marital status: Widowed    Spouse name: Not on file   Number of children: Not on file   Years of  education: Not on file   Highest education level: Not on file  Occupational History   Not on file  Tobacco Use   Smoking status: Never   Smokeless tobacco: Never  Vaping Use   Vaping Use: Never used  Substance and Sexual Activity   Alcohol use: Never   Drug use: Never   Sexual activity: Not on file  Other Topics Concern   Not on file  Social History Narrative   Not on file   Social Determinants of Health   Financial Resource Strain: Not on file  Food Insecurity: No Food Insecurity (11/08/2020)   Hunger Vital Sign    Worried About Running Out of Food in the Last Year: Never true    Ran Out of Food in the Last Year: Never true  Transportation Needs: No Transportation Needs (11/08/2020)   PRAPARE - Hydrologist (Medical): No    Lack of Transportation (Non-Medical): No  Physical Activity: Not on file  Stress: Not on file  Social Connections: Not on file  Intimate Partner Violence: Not on file    Outpatient Medications Prior to Visit  Medication Sig Dispense Refill   apixaban (ELIQUIS) 5 MG TABS tablet Take 1 tablet (5 mg total) by mouth 2 (two) times daily. 180 tablet 1   Biotin 10 MG CAPS Take by mouth.     calcium-vitamin D (OSCAL WITH D) 500-200 MG-UNIT TABS tablet Take by mouth.     clotrimazole-betamethasone (LOTRISONE) cream Apply topically.     cyanocobalamin 1000 MCG tablet Take by mouth.     dicyclomine (BENTYL) 10 MG capsule Take 1 capsule (10 mg total) by mouth 3 (three) times daily before meals. As needed 90 capsule 8   ketoconazole (NIZORAL) 2 % cream Apply to lower abdominal rash 15 g 0   magnesium hydroxide (MILK OF MAGNESIA) 400 MG/5ML suspension Take 15 mLs by mouth daily as needed for mild constipation.     pyridoxine (B-6) 100 MG tablet Take by mouth.     No facility-administered medications prior to visit.    Allergies  Allergen Reactions   Gadolinium Derivatives    Amoxicillin-Pot Clavulanate Nausea Only    Stomach pain     Iodinated Contrast Media Other (See Comments) and Rash     Desc: HAD FACIAL RASH WITH CT CONTRAST IN 2006 HAD FACIAL RASH WITH CT CONTRAST IN 2006  Desc: HAD FACIAL RASH WITH CT CONTRAST IN 2006    Iohexol Rash    HAD FACIAL RASH WITH CT CONTRAST IN 2006     Review of Systems  Constitutional:  Positive for fatigue.  HENT:  Negative for congestion, ear pain and sore throat.   Respiratory:  Negative for cough and shortness of breath.   Cardiovascular:  Negative for chest pain.  Gastrointestinal:  Positive for abdominal pain (Lower tenderness). Negative for constipation, diarrhea, nausea and vomiting.  Genitourinary:  Negative for dysuria, frequency and urgency.  Musculoskeletal:  Negative for arthralgias, back pain and myalgias.  Neurological:  Positive for dizziness. Negative for headaches.  Psychiatric/Behavioral:  Negative for agitation and sleep disturbance. The patient is not nervous/anxious.        Objective:    Physical Exam Vitals reviewed.  Constitutional:      Appearance: Normal appearance. She is normal weight.  Neck:     Vascular: No carotid bruit.  Cardiovascular:     Rate and Rhythm: Normal rate and regular rhythm.     Heart sounds: Normal heart sounds.  Pulmonary:     Effort: Pulmonary effort is normal.     Breath sounds: Normal breath sounds.  Abdominal:     General: Abdomen is flat. Bowel sounds are normal.     Palpations: Abdomen is soft.     Tenderness: There is abdominal tenderness (Lower, and right flank pain).  Skin:    Findings: Rash (Intertrigo under both breast) present.  Neurological:     Mental Status: She is alert and oriented to person, place, and time.  Psychiatric:        Mood and Affect: Mood normal.        Behavior: Behavior normal.     BP 118/72 (BP Location: Left Arm, Patient Position: Sitting, Cuff Size: Large)   Pulse 67   Temp 97.6 F (36.4 C) (Temporal)   Wt 209 lb 3.2 oz (94.9 kg)   SpO2 98%   BMI 33.26 kg/m  Wt  Readings from Last 3 Encounters:  05/25/22 209 lb 3.2 oz (94.9  kg)  05/23/22 210 lb (95.3 kg)  02/16/22 211 lb 3.2 oz (95.8 kg)    Health Maintenance Due  Topic Date Due   Hepatitis C Screening  Never done   TETANUS/TDAP  Never done   Zoster Vaccines- Shingrix (1 of 2) Never done   Pneumonia Vaccine 38+ Years old (1 - PCV) Never done   INFLUENZA VACCINE  04/25/2022    There are no preventive care reminders to display for this patient.   Lab Results  Component Value Date   TSH 2.910 08/05/2021   Lab Results  Component Value Date   WBC 4.0 05/23/2022   HGB 14.4 05/23/2022   HCT 42.8 05/23/2022   MCV 88 05/23/2022   PLT 212 05/23/2022   Lab Results  Component Value Date   NA 141 05/23/2022   K 4.5 05/23/2022   CO2 25 05/23/2022   GLUCOSE 95 05/23/2022   BUN 14 05/23/2022   CREATININE 0.83 05/23/2022   BILITOT 0.6 05/23/2022   ALKPHOS 101 05/23/2022   AST 24 05/23/2022   ALT 21 05/23/2022   PROT 6.8 05/23/2022   ALBUMIN 4.1 05/23/2022   CALCIUM 9.4 05/23/2022   EGFR 74 05/23/2022   GFR 65.95 01/19/2020   Lab Results  Component Value Date   CHOL 216 (H) 05/23/2022   Lab Results  Component Value Date   HDL 53 05/23/2022   Lab Results  Component Value Date   LDLCALC 139 (H) 05/23/2022   Lab Results  Component Value Date   TRIG 132 05/23/2022   Lab Results  Component Value Date   CHOLHDL 4.1 05/23/2022   No results found for: "HGBA1C"     Assessment & Plan:   Problem List Items Addressed This Visit       Musculoskeletal and Integument   Candidiasis, intertrigo    Diflucan 100 mg QD for 7 days       Relevant Medications   sulfamethoxazole-trimethoprim (BACTRIM DS) 800-160 MG tablet   fluconazole (DIFLUCAN) 150 MG tablet     Genitourinary   RESOLVED: Acute cystitis without hematuria    Ordered Urine culture. Bactrim DS 1 PO BID for 7 days      Relevant Medications   sulfamethoxazole-trimethoprim (BACTRIM DS) 800-160 MG tablet   Other  Relevant Orders   Urine Culture (Completed)     Other   RESOLVED: Dysuria - Primary    Ordered UA.      Relevant Orders   POCT URINALYSIS DIP (CLINITEK) (Completed)   Meds ordered this encounter  Medications   sulfamethoxazole-trimethoprim (BACTRIM DS) 800-160 MG tablet    Sig: Take 1 tablet by mouth 2 (two) times daily.    Dispense:  14 tablet    Refill:  0   fluconazole (DIFLUCAN) 150 MG tablet    Sig: Take 1 tablet (150 mg total) by mouth daily.    Dispense:  7 tablet    Refill:  0    Orders Placed This Encounter  Procedures   Urine Culture   POCT URINALYSIS DIP (CLINITEK)     Follow-up: Return if symptoms worsen or fail to improve.  An After Visit Summary was printed and given to the patient.  Rochel Brome, MD [facility] [phone]

## 2022-05-27 DIAGNOSIS — T466X5A Adverse effect of antihyperlipidemic and antiarteriosclerotic drugs, initial encounter: Secondary | ICD-10-CM | POA: Insufficient documentation

## 2022-05-27 LAB — URINE CULTURE

## 2022-05-27 NOTE — Assessment & Plan Note (Signed)
Continue eliquis 5 mg twice daily.

## 2022-05-27 NOTE — Assessment & Plan Note (Signed)
Continue bentyl before meals and before bed.

## 2022-05-27 NOTE — Assessment & Plan Note (Signed)
Not at goal. Refuses statins.  Continue to work on eating a healthy diet and exercise.  Labs drawn today.

## 2022-05-27 NOTE — Assessment & Plan Note (Signed)
Intolerant to statins. 

## 2022-05-27 NOTE — Assessment & Plan Note (Addendum)
Start on pepcid twice daily.  Keep appt with GI>

## 2022-05-27 NOTE — Assessment & Plan Note (Signed)
Recommend statin. Patient refused due to myalgias.

## 2022-05-29 ENCOUNTER — Encounter: Payer: Self-pay | Admitting: Family Medicine

## 2022-06-02 DIAGNOSIS — I358 Other nonrheumatic aortic valve disorders: Secondary | ICD-10-CM | POA: Diagnosis not present

## 2022-06-14 NOTE — Progress Notes (Signed)
06/19/2022 Patricia Leonard [id number] Jan 12, 1951  Referring provider: Rochel Brome, MD Primary GI doctor: Dr. Lyndel Safe  ASSESSMENT AND PLAN:   Ab bloating with nausea some nausea, no vomiting Some burping and early satiety.  Suggest gastroparesis study as this can happen post nissan fundiplication-will schedule this visit Continue Gas x, can do trial of reglan Discussed diet, things to avoid  Acute pulmonary embolism without acute cor pulmonale, unspecified pulmonary embolism type ([place]) pneumonia with bilateral PE 12/29/2021 and now on eliquis Will be on 6 months in Oct Patient continues to have wheezing and SOB, some right flank pain on rash and some tenderness on exam- possible costochondritis.  Will get CXR, follow up with PCP, ER precautions discussed.  Will schedule colon at follow up after her symptoms improve and sees PCP  History of colonic polyps with Family history of colon cancer No anemia  Will schedule at follow up after she sees PCP for SOB/right flank pain  Irritable bowel syndrome with constipation Continue miralax daily, can consider trulance versus motegrity  Hiatal hernia with GERD  status post TIF procedure with [employer] fundoplication CT AB and pelvis 03/25 showed intact hiatal hernia repair and was normal  History of Present Illness:  71 y.o. female  with a past medical history of hyperlipidemia, GERD with history of peptic ulcer disease, diverticulosis, internal hemorrhoids, status post cholecystectomy and others listed below, returns to clinic today for follow-up of abdominal pain and nausea.  08/31/2021 OV with Dr. Lyndel Safe  for reflux with history of large hiatal hernia status post [place], history of tubular adenoma colonoscopy 05/2018, IBS C failing Linzess, pelvic floor laxity.  At that visit patient was set up for colonoscopy and endoscopy with Dr. Lyndel Safe however she had an emergency at [facility] and had procedures done there. Patient  status post TIF procedure with [employer] fundoplication, has not had colonoscopy.   12/15/2021- CT AB and pelvis without contrast for flank pain showed normal pancreatitis, spleen, normal stomach, hiatal hernia interval repair.  Most recently had pneumonia with bilateral PE 12/29/2021 and now on eliquis.   Patient was suggested to have gastroparesis study last visit, did not have done, given FODMAP diet.   Due to recent PE patient was not set up for colonoscopy, presents today to be evaluated for colonoscopy. She states she has severe gas with burping, some right flank pain.  She has nausea with coffee, worse carbonation.  Nausea worse in the morning, denies getting full very quickly.  She can feel the gas is there and will have to move around, has flatulance but not as much as burping.  Has occ wheezing, SOB and states her stamina is not where it was before the surgery.  She has constipation more than diarrhea, can alter back and forth.  She is still on eliquis, with Dr. Tobie Poet.  She has a lot of AB bloating.   Past GI procedures: -UGI 02/2020: Large hiatal hernia, with approximately 60% of the stomach positioned in the chest. No acute complication. -EGD 03/2018: 3 cm HH, mild gastritis.  Negative biopsies for HP.  04/14/2016-3 cm HH.  Neg SB Bx for celiac.  EGD 11/30/2014: Gastric ulcers, hiatal hernia, neg Bx -Colonoscopy 03/2018: Normal cecum.  6 mm polyp s/p polypectomy (TA), moderate left colonic diverticulosis, internal hemorrhoids, negative random colonic biopsies for microscopic colitis.  Repeat in 5 years.  04/20/2014; mild diverticulosis, previous hemorrhoidectomy.  Otherwise normal colonoscopy -CT AP without IV contrast 01/29/2020: Large HH, soft tissue  fullness in the cecum, pelvic floor laxity, constipation.  03/26/2014: Small adrenal adenoma, otherwise normal. -12/15/2021- CT AB and pelvis without contrast for flank pain showed normal pancreatitis, spleen, normal stomach, hiatal hernia interval  repair.   Current Medications:       Current Outpatient Medications (Hematological):    apixaban (ELIQUIS) 5 MG TABS tablet, Take 1 tablet (5 mg total) by mouth 2 (two) times daily.   cyanocobalamin 1000 MCG tablet, Take by mouth.  Current Outpatient Medications (Other):    Biotin 10 MG CAPS, Take by mouth.   calcium-vitamin D (OSCAL WITH D) 500-200 MG-UNIT TABS tablet, Take by mouth.   clotrimazole-betamethasone (LOTRISONE) cream, Apply topically.   dicyclomine (BENTYL) 10 MG capsule, Take 1 capsule (10 mg total) by mouth 3 (three) times daily before meals. As needed   fluconazole (DIFLUCAN) 150 MG tablet, Take 1 tablet (150 mg total) by mouth daily.   ketoconazole (NIZORAL) 2 % cream, Apply to lower abdominal rash   magnesium hydroxide (MILK OF MAGNESIA) 400 MG/5ML suspension, Take 15 mLs by mouth daily as needed for mild constipation.   metoCLOPramide (REGLAN) 5 MG tablet, Take 1 tablet (5 mg total) by mouth every 8 (eight) hours as needed for up to 10 days for nausea or vomiting.   pyridoxine (B-6) 100 MG tablet, Take by mouth.  Surgical History:  She  has a past surgical history that includes Tonsillectomy; Appendectomy; Cholecystectomy; Cesarean section; Colonoscopy (04/18/2014); Esophagogastroduodenoscopy (04/14/2016); Lipoma excision; thermal ablation; Hernia repair (11/07/2021); and Nissen fundoplication (77/82/4235). Family History:  Her family history includes CAD in her father; Cancer in her maternal aunt, paternal aunt, and another family member; Colon cancer in her mother and sister; Diabetes in her maternal grandmother and paternal grandmother; Gout in her brother; Heart attack in an other family member; Hyperlipidemia in her brother; Hypertension in her sister; Liver cancer in her father; Migraines in an other family member. Social History:   reports that she has never smoked. She has never used smokeless tobacco. She reports that she does not drink alcohol and does not  use drugs.  Current Medications, Allergies, Past Medical History, Past Surgical History, Family History and Social History were reviewed in [employer] record.  Physical Exam: BP 110/72   Pulse 61   Ht 5' 6.5" (1.689 m)   Wt 209 lb (94.8 kg)   BMI 33.23 kg/m  General:   Pleasant, well developed female in no acute distress Heart:  Regular rate and rhythm; no murmurs Pulm: Clear anteriorly; decreased breath sounds right lower, some mild costochondral tenderness left anterior, no rash Abdomen:  Soft, Obese AB, Active bowel sounds. mild tenderness in the RUQ, epigastric. Without guarding and Without rebound, No organomegaly appreciated. Extremities:  without  edema. Neurologic:  Alert and  oriented x4;  No focal deficits.  Psych:  Cooperative. Normal mood and affect.   Vladimir Crofts, PA-C 06/19/22

## 2022-06-19 ENCOUNTER — Encounter: Payer: Self-pay | Admitting: Physician Assistant

## 2022-06-19 ENCOUNTER — Ambulatory Visit (INDEPENDENT_AMBULATORY_CARE_PROVIDER_SITE_OTHER)
Admission: RE | Admit: 2022-06-19 | Discharge: 2022-06-19 | Disposition: A | Discharge status: Home / Self Care | Payer: PPO | Source: Ambulatory Visit | Attending: Physician Assistant | Admitting: Physician Assistant

## 2022-06-19 ENCOUNTER — Ambulatory Visit (INDEPENDENT_AMBULATORY_CARE_PROVIDER_SITE_OTHER): Payer: PPO | Admitting: Physician Assistant

## 2022-06-19 VITALS — BP 110/72 | HR 61 | Ht 66.5 in | Wt 209.0 lb

## 2022-06-19 DIAGNOSIS — Z86010 Personal history of colonic polyps: Secondary | ICD-10-CM | POA: Diagnosis not present

## 2022-06-19 DIAGNOSIS — R110 Nausea: Secondary | ICD-10-CM | POA: Diagnosis not present

## 2022-06-19 DIAGNOSIS — R0602 Shortness of breath: Secondary | ICD-10-CM | POA: Diagnosis not present

## 2022-06-19 DIAGNOSIS — R140 Abdominal distension (gaseous): Secondary | ICD-10-CM | POA: Diagnosis not present

## 2022-06-19 DIAGNOSIS — I2699 Other pulmonary embolism without acute cor pulmonale: Secondary | ICD-10-CM

## 2022-06-19 DIAGNOSIS — R062 Wheezing: Secondary | ICD-10-CM | POA: Diagnosis not present

## 2022-06-19 DIAGNOSIS — H40053 Ocular hypertension, bilateral: Secondary | ICD-10-CM | POA: Diagnosis not present

## 2022-06-19 DIAGNOSIS — K219 Gastro-esophageal reflux disease without esophagitis: Secondary | ICD-10-CM

## 2022-06-19 DIAGNOSIS — K449 Diaphragmatic hernia without obstruction or gangrene: Secondary | ICD-10-CM | POA: Diagnosis not present

## 2022-06-19 DIAGNOSIS — H2513 Age-related nuclear cataract, bilateral: Secondary | ICD-10-CM | POA: Diagnosis not present

## 2022-06-19 MED ORDER — METOCLOPRAMIDE HCL 5 MG PO TABS: 5.0000 mg | ORAL_TABLET | Freq: Three times a day (TID) | ORAL | 0 refills | Status: DC | PRN

## 2022-06-19 NOTE — Progress Notes (Signed)
Agree with assessment/plan.  Raj Atlas Kuc, MD [facility] [phone]  

## 2022-06-19 NOTE — Patient Instructions (Addendum)
You have been scheduled for a gastric emptying scan at [facility] on 07/12/22 at 7:30 . Please arrive at least 15 minutes prior to your appointment for registration. Please make certain not to have anything to eat or drink after 4-6 hours prior the night before your test. Hold all stomach medications (ex: Zofran, phenergan, Reglan) 48 hours prior to your test. If you need to reschedule your appointment, please contact radiology scheduling at [phone]. _____________________________________________________________________ A gastric-emptying study measures how long it takes for food to move through your stomach. There are several ways to measure stomach emptying. In the most common test, you eat food that contains a small amount of radioactive material. A scanner that detects the movement of the radioactive material is placed over your abdomen to monitor the rate at which food leaves your stomach. This test normally takes about 4 hours to complete. _____________________________________________________________________ Remember belching is caused by excessive air swallowing.    We have sent the following medications to your pharmacy for you to pick up at your convenience: Reglan  Please stop: Eating or drinking too fast  Poorly fitting dentures; not chewing food completely  Carbonated beverages  Chewing gum or sucking on hard candies  Excessive swallowing due to nervous tension or postnasal drip  Forced belching to relieve abdominal discomfort To prevent excessive belching, avoid:  Carbonated beverages  Chewing gum  Hard candies   Simethicone/GasX may be helpful  Can try Florastor probiotic twice a day  Will get gastroparesis study  We may want to evaluate you for small intestinal bacterial overgrowth, this can cause increase gas, bloating, loose stools or constipation.  There is a test for this we can do or sometimes we will treat a patient with an antibiotic to see if it helps.    Reglan or metoclopramide  Can be used for gastroparesis or slow stomach, nausea, vomiting, GERD.   Continue the medication as needed up to 2 times a day, on an empty stomach 30 minutes before eating. It may take a few weeks for your stomach condition to start to get better. However, do not take this medicine for longer than 12 weeks.  The longer you take this medicine, and the more you take it, the greater your chances are of developing serious side effects.  Some people may get a severe muscle problem called tardive dyskinesia. This problem may lessen or go away after stopping this drug, but it may not go away. The risk is greater with diabetes and in older adults, especially older females. The risk is greater with longer use or higher doses, but it may also occur after short-term use with low doses. Call your doctor right away if you have trouble controlling body movements or problems with your tongue, face, mouth, or jaw like tongue sticking out, puffing cheeks, mouth puckering, or chewing.   Please monitor for worsening depression or thoughts of suicide, any aggressiveness or hyperactivity.  If this happen please stop and call your physician right away.    Gastroparesis Please do small frequent meals like 4-6 meals a day.  Eat and drink liquids at separate times.  Avoid high fiber foods, cook your vegetables, avoid high fat food.  Suggest spreading protein throughout the day (greek yogurt, glucerna, soft meat, milk, eggs) Choose soft foods that you can mash with a fork When you are more symptomatic, change to pureed foods foods and liquids.  Consider reading "Living well with Gastroparesis" by Lambert Keto Gastroparesis is a condition in which  food takes longer than normal to empty from the stomach. This condition is also known as delayed gastric emptying. It is usually a long-term (chronic) condition. There is no cure, but there are treatments and things that you can do at home  to help relieve symptoms. Treating the underlying condition that causes gastroparesis can also help relieve symptoms What are the causes? In many cases, the cause of this condition is not known. Possible causes include: A hormone (endocrine) disorder, such as hypothyroidism or diabetes. A nervous system disease, such as Parkinson's disease or multiple sclerosis. Cancer, infection, or surgery that affects the stomach or vagus nerve. The vagus nerve runs from your chest, through your neck, and to the lower part of your brain. A connective tissue disorder, such as scleroderma. Certain medicines. What increases the risk? You are more likely to develop this condition if: You have certain disorders or diseases. These may include: An endocrine disorder. An eating disorder. Amyloidosis. Scleroderma. Parkinson's disease. Multiple sclerosis. Cancer or infection of the stomach or the vagus nerve. You have had surgery on your stomach or vagus nerve. You take certain medicines. You are female. What are the signs or symptoms? Symptoms of this condition include: Feeling full after eating very little or a loss of appetite. Nausea, vomiting, or heartburn. Bloating of your abdomen. Inconsistent blood sugar (glucose) levels on blood tests. Unexplained weight loss. Acid from the stomach coming up into the esophagus (gastroesophageal reflux). Sudden tightening (spasm) of the stomach, which can be painful. Symptoms may come and go. Some people may not notice any symptoms. How is this diagnosed? This condition is diagnosed with tests, such as: Tests that check how long it takes food to move through the stomach and intestines. These tests include: Upper gastrointestinal (GI) series. For this test, you drink a liquid that shows up well on X-rays, and then X-rays are taken of your intestines. Gastric emptying scintigraphy. For this test, you eat food that contains a small amount of radioactive material, and  then scans are taken. Wireless capsule GI monitoring system. For this test, you swallow a pill (capsule) that records information about how foods and fluid move through your stomach. Gastric manometry. For this test, a tube is passed down your throat and into your stomach to measure electrical and muscular activity. Endoscopy. For this test, a long, thin tube with a camera and light on the end is passed down your throat and into your stomach to check for problems in your stomach lining. Ultrasound. This test uses sound waves to create images of the inside of your body. This can help rule out gallbladder disease or pancreatitis as a cause of your symptoms. How is this treated? There is no cure for this condition, but treatment and home care may relieve symptoms. Treatment may include: Treating the underlying cause. Managing your symptoms by making changes to your diet and exercise habits. Taking medicines to control nausea and vomiting and to stimulate stomach muscles. Getting food through a feeding tube in the hospital. This may be done in severe cases. Having surgery to insert a device called a gastric electrical stimulator into your body. This device helps improve stomach emptying and control nausea and vomiting. Follow these instructions at home: Take over-the-counter and prescription medicines only as told by your health care provider. Follow instructions from your health care provider about eating or drinking restrictions. Your health care provider may recommend that you: Eat smaller meals more often. Eat low-fat foods. Eat low-fiber forms of high-fiber  foods. For example, eat cooked vegetables instead of raw vegetables. Have only liquid foods instead of solid foods. Liquid foods are easier to digest. Drink enough fluid to keep your urine pale yellow. Exercise as often as told by your health care provider. Keep all follow-up visits. This is important. Contact a health care provider if  you: Notice that your symptoms do not improve with treatment. Have new symptoms. Get help right away if you: Have severe pain in your abdomen that does not improve with treatment. Have nausea that is severe or does not go away. Vomit every time you drink fluids. Summary Gastroparesis is a long-term (chronic) condition in which food takes longer than normal to empty from the stomach. Symptoms include nausea, vomiting, heartburn, bloating of your abdomen, and loss of appetite. Eating smaller portions, low-fat foods, and low-fiber forms of high-fiber foods may help you manage your symptoms. Get help right away if you have severe pain in your abdomen. This information is not intended to replace advice given to you by your health care provider. Make sure you discuss any questions you have with your health care provider. Document Revised: 01/19/2020 Document Reviewed: 01/19/2020 Elsevier Patient Education  2021 [employer].

## 2022-06-26 DIAGNOSIS — N390 Urinary tract infection, site not specified: Secondary | ICD-10-CM | POA: Diagnosis not present

## 2022-06-26 DIAGNOSIS — Z01419 Encounter for gynecological examination (general) (routine) without abnormal findings: Secondary | ICD-10-CM | POA: Diagnosis not present

## 2022-06-26 DIAGNOSIS — B3731 Acute candidiasis of vulva and vagina: Secondary | ICD-10-CM | POA: Diagnosis not present

## 2022-06-26 DIAGNOSIS — Z6834 Body mass index (BMI) 34.0-34.9, adult: Secondary | ICD-10-CM | POA: Diagnosis not present

## 2022-07-12 ENCOUNTER — Ambulatory Visit (HOSPITAL_COMMUNITY)
Admission: RE | Admit: 2022-07-12 | Discharge: 2022-07-12 | Disposition: A | Discharge status: Home / Self Care | Payer: PPO | Source: Ambulatory Visit | Attending: Physician Assistant | Admitting: Physician Assistant

## 2022-07-12 DIAGNOSIS — Z86010 Personal history of colonic polyps: Secondary | ICD-10-CM | POA: Diagnosis not present

## 2022-07-12 DIAGNOSIS — K219 Gastro-esophageal reflux disease without esophagitis: Secondary | ICD-10-CM | POA: Insufficient documentation

## 2022-07-12 DIAGNOSIS — Z0389 Encounter for observation for other suspected diseases and conditions ruled out: Secondary | ICD-10-CM | POA: Diagnosis not present

## 2022-07-12 DIAGNOSIS — K449 Diaphragmatic hernia without obstruction or gangrene: Secondary | ICD-10-CM | POA: Insufficient documentation

## 2022-07-12 DIAGNOSIS — K581 Irritable bowel syndrome with constipation: Secondary | ICD-10-CM | POA: Insufficient documentation

## 2022-07-12 DIAGNOSIS — R140 Abdominal distension (gaseous): Secondary | ICD-10-CM | POA: Diagnosis not present

## 2022-07-12 DIAGNOSIS — R110 Nausea: Secondary | ICD-10-CM | POA: Insufficient documentation

## 2022-07-12 MED ORDER — TECHNETIUM TC 99M SULFUR COLLOID
2.2000 | Freq: Once | INTRAVENOUS | Status: AC
  Administered 2022-07-12: 2.2 via ORAL

## 2022-07-13 ENCOUNTER — Encounter: Payer: Self-pay | Admitting: Gastroenterology

## 2022-07-20 ENCOUNTER — Ambulatory Visit: Payer: PPO | Admitting: Physician Assistant

## 2022-07-28 ENCOUNTER — Other Ambulatory Visit: Payer: Self-pay | Admitting: Physician Assistant

## 2022-07-28 MED ORDER — MOTEGRITY 2 MG PO TABS: 2.0000 mg | ORAL_TABLET | Freq: Every day | ORAL | 0 refills | Status: DC

## 2022-08-08 ENCOUNTER — Telehealth: Payer: Self-pay

## 2022-08-08 NOTE — Telephone Encounter (Signed)
Patient called stating that she will be having some dental work as well as a colonoscopy soon and would like to know if she can hold her Eliquis during this time.  Her dental consult is tomorrow and her colonoscopy consult is next week.  Please advise.

## 2022-08-14 NOTE — Progress Notes (Signed)
08/14/2022 Patricia Leonard [id number] 1951-03-23  Referring provider: Rochel Brome, MD Primary GI doctor: Dr. Lyndel Safe  ASSESSMENT AND PLAN:   Gastroparesis secondary to nissan fundoplication She states the reglan caused fatigue/difficult to monitor, feels she is controlling it with diet.  Continue Gas x Discussed diet, things to avoid  Acute pulmonary embolism without acute cor pulmonale, unspecified pulmonary embolism type (HCC) Currently on eliquis for 6 months, uncertain if she is staying on it or not but we did get medical clearance from Dr. Tobie Poet to hold for 2 days prior  History of colonic polyps with Family history of colon cancer Breathing is better, will schedule for colonoscopy We have discussed the risks of bleeding, infection, perforation, medication reactions, and remote risk of death associated with colonoscopy. All questions were answered and the patient acknowledges these risk and wishes to proceed.  Irritable bowel syndrome with constipation Has a lot of gas, has BM with everytime she goes to the bathroom.  Will do 2 day colonoscopy If still having symptoms consider SIBO testing, motegrity samples and/or pelvic floor PT  Hiatal hernia with GERD  status post TIF procedure with [employer] fundoplication CT AB and pelvis 03/25 showed intact hiatal hernia repair and was normal  History of Present Illness:  71 y.o. female  with a past medical history of hyperlipidemia, GERD with history of peptic ulcer disease, diverticulosis, internal hemorrhoids, status post cholecystectomy and others listed below, returns to clinic today for follow-up of abdominal pain and nausea.  08/31/2021 OV with Dr. Lyndel Safe  for reflux with history of large hiatal hernia status post Coley, history of tubular adenoma colonoscopy 05/2018, IBS C failing Linzess, pelvic floor laxity.  At that visit patient was set up for colonoscopy and endoscopy with Dr. Lyndel Safe however she had an emergency at [facility] and had procedures done there. Patient status post TIF procedure with [employer] fundoplication, has not had colonoscopy.   12/15/2021- CT AB and pelvis without contrast for flank pain showed normal pancreatitis, spleen, normal stomach, hiatal hernia interval repair.   12/29/2021 pneumonia with bilateral PE  now on eliquis.   06/19/2022 patient continued to complain of gas, burping, nausea after fundoplication, set up for GES which was positive.  Patient was continuing to have wheezing and shortness of breath chest x-ray unremarkable colonoscopy not scheduled due to symptoms.  We will schedule today.. 07/12/2022 gastric emptying study showed delayed gastric emptying with 36% emptied at 4 hours. Patient given trial of Reglan 5 mg and was unable to tolerate this due to fatigue. Given Motegrity samples to see if this could help with potentially IBS with constipation as well as her gastroparesis.  Patient presents for follow-up for gastroparesis and for colonoscopy. Still on Eliquis with Dr. Tobie Poet. She states she has improved with diet for her nausea, she continues to have gas but states she is controlling this with diet as well. No AB bloating.  She states she was having constipation, now having small hard stools every time she goes to the bathroom, feels incomplete Bm's.  Had pelvic floor therapy without help.    Past GI procedures: -UGI 02/2020: Large hiatal hernia, with approximately 60% of the stomach positioned in the chest. No acute complication. -EGD 03/2018: 3 cm HH, mild gastritis.  Negative biopsies for HP.  04/14/2016-3 cm HH.  Neg SB Bx for celiac.  EGD 11/30/2014: Gastric ulcers, hiatal hernia, neg Bx -Colonoscopy 03/2018: Normal cecum.  6 mm polyp s/p polypectomy (TA), moderate left  colonic diverticulosis, internal hemorrhoids, negative random colonic biopsies for microscopic colitis.  Repeat in 5 years.  04/20/2014; mild diverticulosis, previous hemorrhoidectomy.  Otherwise  normal colonoscopy -CT AP without IV contrast 01/29/2020: Large HH, soft tissue fullness in the cecum, pelvic floor laxity, constipation.  03/26/2014: Small adrenal adenoma, otherwise normal. -12/15/2021- CT AB and pelvis without contrast for flank pain showed normal pancreatitis, spleen, normal stomach, hiatal hernia interval repair.   Current Medications:       Current Outpatient Medications (Hematological):    apixaban (ELIQUIS) 5 MG TABS tablet, Take 1 tablet (5 mg total) by mouth 2 (two) times daily.   cyanocobalamin 1000 MCG tablet, Take by mouth.  Current Outpatient Medications (Other):    Prucalopride Succinate (MOTEGRITY) 2 MG TABS, Take 1 tablet (2 mg total) by mouth daily.   Biotin 10 MG CAPS, Take by mouth.   calcium-vitamin D (OSCAL WITH D) 500-200 MG-UNIT TABS tablet, Take by mouth.   clotrimazole-betamethasone (LOTRISONE) cream, Apply topically.   dicyclomine (BENTYL) 10 MG capsule, Take 1 capsule (10 mg total) by mouth 3 (three) times daily before meals. As needed   fluconazole (DIFLUCAN) 150 MG tablet, Take 1 tablet (150 mg total) by mouth daily.   ketoconazole (NIZORAL) 2 % cream, Apply to lower abdominal rash   magnesium hydroxide (MILK OF MAGNESIA) 400 MG/5ML suspension, Take 15 mLs by mouth daily as needed for mild constipation.   metoCLOPramide (REGLAN) 5 MG tablet, Take 1 tablet (5 mg total) by mouth every 8 (eight) hours as needed for up to 10 days for nausea or vomiting.   pyridoxine (B-6) 100 MG tablet, Take by mouth.  Surgical History:  She  has a past surgical history that includes Tonsillectomy; Appendectomy; Cholecystectomy; Cesarean section; Colonoscopy (04/18/2014); Esophagogastroduodenoscopy (04/14/2016); Lipoma excision; thermal ablation; Hernia repair (11/07/2021); and Nissen fundoplication (85/27/7824). Family History:  Her family history includes CAD in her father; Cancer in her maternal aunt, paternal aunt, and another family member; Colon cancer in her  mother and sister; Diabetes in her maternal grandmother and paternal grandmother; Gout in her brother; Heart attack in an other family member; Hyperlipidemia in her brother; Hypertension in her sister; Liver cancer in her father; Migraines in an other family member. Social History:   reports that she has never smoked. She has never used smokeless tobacco. She reports that she does not drink alcohol and does not use drugs.  Current Medications, Allergies, Past Medical History, Past Surgical History, Family History and Social History were reviewed in [employer] record.  Physical Exam: There were no vitals taken for this visit. General:   Pleasant, well developed female in no acute distress Heart:  Regular rate and rhythm; no murmurs Pulm: Clear anteriorly; decreased breath sounds right lower, some mild costochondral tenderness left anterior, no rash Abdomen:  Soft, Obese AB, Active bowel sounds. mild tenderness in the RUQ, epigastric. Without guarding and Without rebound, No organomegaly appreciated. Extremities:  without  edema. Neurologic:  Alert and  oriented x4;  No focal deficits.  Psych:  Cooperative. Normal mood and affect.   Vladimir Crofts, PA-C 08/14/22

## 2022-08-16 ENCOUNTER — Encounter: Payer: Self-pay | Admitting: Physician Assistant

## 2022-08-16 ENCOUNTER — Ambulatory Visit (INDEPENDENT_AMBULATORY_CARE_PROVIDER_SITE_OTHER): Payer: PPO | Admitting: Physician Assistant

## 2022-08-16 VITALS — BP 130/82 | HR 95 | Ht 66.5 in | Wt 209.5 lb

## 2022-08-16 DIAGNOSIS — Z86010 Personal history of colonic polyps: Secondary | ICD-10-CM

## 2022-08-16 DIAGNOSIS — I2699 Other pulmonary embolism without acute cor pulmonale: Secondary | ICD-10-CM

## 2022-08-16 DIAGNOSIS — K3184 Gastroparesis: Secondary | ICD-10-CM

## 2022-08-16 MED ORDER — NA SULFATE-K SULFATE-MG SULF 17.5-3.13-1.6 GM/177ML PO SOLN: 1.0000 | Freq: Once | ORAL | 0 refills | Status: AC

## 2022-08-16 NOTE — Patient Instructions (Addendum)
_______________________________________________________  If you are age 73 or older, your body mass index should be between 23-30. Your Body mass index is 33.31 kg/m. If this is out of the aforementioned range listed, please consider follow up with your Primary Care Provider.  If you are age 73 or older, your body mass index should be between 19-25. Your Body mass index is 33.31 kg/m. If this is out of the aformentioned range listed, please consider follow up with your Primary Care Provider.   ________________________________________________________  The [facility] providers would like to encourage you to use [facility] to communicate with providers for non-urgent requests or questions.  Due to long hold times on the telephone, sending your provider a message by [facility] may be a faster and more efficient way to get a response.  Please allow 48 business hours for a response.  Please remember that this is for non-urgent requests.  _______________________________________________________  Dennis Bast have been scheduled for a colonoscopy. Please follow written instructions given to you at your visit today.  Please pick up your prep supplies at the pharmacy within the next 1-3 days. If you use inhalers (even only as needed), please bring them with you on the day of your procedure.  Stop Eliquis 2 days prior to the procedure. Stop 10-11-2022  After colonoscopy can trying the motegrity samples Can consider pelvic floor physical therapy We may want to evaluate you for small intestinal bacterial overgrowth, this can cause increase gas, bloating, loose stools or constipation.  There is a test for this we can do or sometimes we will treat a patient with an antibiotic to see if it helps.   First do a trial off milk/lactose products if you use them.  Add fiber like benefiber or citracel once a day Can do trial of IBGard which is over the counter for AB pain- Take 1-2 capsules once a day for maintence or twice a  day during a flare  Abdominal bloating and discomfort may be due to intestinal sensitivity or symptoms of irritable bowel syndrome. To relieve symptoms, avoid:  Broccoli  Baked beans  Cabbage  Carbonated drinks  Cauliflower  Chewing gum  Hard candy Abdominal distention resulting from weak abdominal muscles:  Is better in the morning  Gets worse as the day progresses  Is relieved by lying down Flatulence is gas created through bacterial action in the bowel and passed rectally. Keep in mind that:  10-18 passages per day are normal  Primary gases are harmless and odorless  Noticeable smells are trace gases related to food intake Foods to AVOID that are likely to form gas include:  Milk, dairy products, and medications that contain lactose--If your body doesn't produce the enzyme (lactase) to break it down.  Certain vegetables--baked beans, cauliflower, broccoli, cabbage  Certain starches--wheat, oats, corn, potatoes. Rice is a good substitute. Identify offending foods. Reduce or eliminate these gas-forming foods from your diet. Can look at the [place].   Please try low FODMAP diet- see below- start with eliminating just one column at a time, the table at the very bottom contains foods that are safe to take   FODMAP stands for fermentable oligo-, di-, mono-saccharides and polyols (1). These are the scientific terms used to classify groups of carbs that are notorious for triggering digestive symptoms like bloating, gas and stomach pain.     Here some information about pelvic floor dysfunction.   Pelvic Floor Dysfunction, Female Pelvic floor dysfunction (PFD) is a condition that results when the group  of muscles and connective tissues that support the organs in the pelvis (pelvic floor muscles) do not work well. These muscles and their connections form a sling that supports the colon and bladder. In women, they also support the uterus. PFD causes pelvic floor muscles to be too weak,  too tight, or both. In PFD, muscle movements are not coordinated. This may cause bowel or bladder problems. It may also cause pain. What are the causes? This condition may be caused by an injury to the pelvic area or by a weakening of pelvic muscles. This often results from pregnancy and childbirth or other types of strain. In many cases, the exact cause is not known. What increases the risk? The following factors may make you more likely to develop this condition: Having chronic bladder tissue inflammation (interstitial cystitis). Being an older person. Being overweight. History of radiation treatment for cancer in the pelvic region. Previous pelvic surgery, such as removal of the uterus (hysterectomy). What are the signs or symptoms? Symptoms of this condition vary and may include: Bladder symptoms, such as: Trouble starting urination and emptying the bladder. Frequent urinary tract infections. Leaking urine when coughing, laughing, or exercising (stress incontinence). Having to pass urine urgently or frequently. Pain when passing urine. Bowel symptoms, such as: Constipation. Urgent or frequent bowel movements. Incomplete bowel movements. Painful bowel movements. Leaking stool or gas. Unexplained genital or rectal pain. Genital or rectal muscle spasms. Low back pain. Other symptoms may include: A heavy, full, or aching feeling in the vagina. A bulge that protrudes into the vagina. Pain during or after sex. How is this diagnosed? This condition may be diagnosed based on: Your symptoms and medical history. A physical exam. During the exam, your health care provider may check your pelvic muscles for tightness, spasm, pain, or weakness. This may include a rectal exam and a pelvic exam. In some cases, you may have diagnostic tests, such as: Electrical muscle function tests. Urine flow testing. X-ray tests of bowel function. Ultrasound of the pelvic organs. How is this  treated? Treatment for this condition depends on the symptoms. Treatment options include: Physical therapy. This may include Kegel exercises to help relax or strengthen the pelvic floor muscles. Biofeedback. This type of therapy provides feedback on how tight your pelvic floor muscles are so that you can learn to control them. Internal or external massage therapy. A treatment that involves electrical stimulation of the pelvic floor muscles to help control pain (transcutaneous electrical nerve stimulation, or TENS). Sound wave therapy (ultrasound) to reduce muscle spasms. Medicines, such as: Muscle relaxants. Bladder control medicines. Surgery to reconstruct or support pelvic floor muscles may be an option if other treatments do not help. Follow these instructions at home: Activity Do your usual activities as told by your health care provider. Ask your health care provider if you should modify any activities. Do pelvic floor strengthening or relaxing exercises at home as told by your physical therapist. Lifestyle Maintain a healthy weight. Eat foods that are high in fiber, such as beans, whole grains, and fresh fruits and vegetables. Limit foods that are high in fat and processed sugars, such as fried or sweet foods. Manage stress with relaxation techniques such as yoga or meditation. General instructions If you have problems with leakage: Use absorbable pads or wear padded underwear. Wash frequently with mild soap. Keep your genital and anal area as clean and dry as possible. Ask your health care provider if you should try a barrier cream to prevent skin  irritation. Take warm baths to relieve pelvic muscle tension or spasms. Take over-the-counter and prescription medicines only as told by your health care provider. Keep all follow-up visits. How is this prevented? The cause of PFD is not always known, but there are a few things you can do to reduce the risk of developing this condition,  including: Staying at a healthy weight. Getting regular exercise. Managing stress. Contact a health care provider if: Your symptoms are not improving with home care. You have signs or symptoms of PFD that get worse at home. You develop new signs or symptoms. You have signs of a urinary tract infection, such as: Fever. Chills. Increased urinary frequency. A burning feeling when urinating. You have not had a bowel movement in 3 days (constipation). Summary Pelvic floor dysfunction results when the muscles and connective tissues in your pelvic floor do not work well. These muscles and their connections form a sling that supports your colon and bladder. In women, they also support the uterus. PFD may be caused by an injury to the pelvic area or by a weakening of pelvic muscles. PFD causes pelvic floor muscles to be too weak, too tight, or a combination of both. Symptoms may vary from person to person. In most cases, PFD can be treated with physical therapies and medicines. Surgery may be an option if other treatments do not help. This information is not intended to replace advice given to you by your health care provider. Make sure you discuss any questions you have with your health care provider. Document Revised: 01/19/2021 Document Reviewed: 01/19/2021 Elsevier Patient Education  [place].

## 2022-08-29 ENCOUNTER — Other Ambulatory Visit: Payer: Self-pay | Admitting: Family Medicine

## 2022-08-29 DIAGNOSIS — I2694 Multiple subsegmental pulmonary emboli without acute cor pulmonale: Secondary | ICD-10-CM

## 2022-08-30 NOTE — Progress Notes (Signed)
Agree with assessment/plan.  Raj Phynix Horton, MD [facility] [phone]  

## 2022-09-27 ENCOUNTER — Encounter: Payer: Self-pay | Admitting: Family Medicine

## 2022-09-27 ENCOUNTER — Ambulatory Visit (INDEPENDENT_AMBULATORY_CARE_PROVIDER_SITE_OTHER): Payer: PPO | Admitting: Family Medicine

## 2022-09-27 VITALS — BP 138/82 | HR 59 | Temp 97.4°F | Ht 66.5 in | Wt 209.0 lb

## 2022-09-27 DIAGNOSIS — R079 Chest pain, unspecified: Secondary | ICD-10-CM | POA: Insufficient documentation

## 2022-09-27 DIAGNOSIS — R7303 Prediabetes: Secondary | ICD-10-CM

## 2022-09-27 DIAGNOSIS — I700 Atherosclerosis of aorta: Secondary | ICD-10-CM | POA: Diagnosis not present

## 2022-09-27 DIAGNOSIS — M791 Myalgia, unspecified site: Secondary | ICD-10-CM

## 2022-09-27 DIAGNOSIS — Z6833 Body mass index (BMI) 33.0-33.9, adult: Secondary | ICD-10-CM

## 2022-09-27 DIAGNOSIS — R10811 Right upper quadrant abdominal tenderness: Secondary | ICD-10-CM | POA: Insufficient documentation

## 2022-09-27 DIAGNOSIS — R0789 Other chest pain: Secondary | ICD-10-CM | POA: Diagnosis not present

## 2022-09-27 DIAGNOSIS — E559 Vitamin D deficiency, unspecified: Secondary | ICD-10-CM | POA: Diagnosis not present

## 2022-09-27 DIAGNOSIS — E782 Mixed hyperlipidemia: Secondary | ICD-10-CM

## 2022-09-27 DIAGNOSIS — K3184 Gastroparesis: Secondary | ICD-10-CM | POA: Insufficient documentation

## 2022-09-27 DIAGNOSIS — Z86711 Personal history of pulmonary embolism: Secondary | ICD-10-CM | POA: Diagnosis not present

## 2022-09-27 DIAGNOSIS — K219 Gastro-esophageal reflux disease without esophagitis: Secondary | ICD-10-CM | POA: Diagnosis not present

## 2022-09-27 DIAGNOSIS — K581 Irritable bowel syndrome with constipation: Secondary | ICD-10-CM | POA: Diagnosis not present

## 2022-09-27 DIAGNOSIS — T466X5A Adverse effect of antihyperlipidemic and antiarteriosclerotic drugs, initial encounter: Secondary | ICD-10-CM

## 2022-09-27 DIAGNOSIS — Z23 Encounter for immunization: Secondary | ICD-10-CM | POA: Diagnosis not present

## 2022-09-27 MED ORDER — PREDNISONE 50 MG PO TABS: 50.0000 mg | ORAL_TABLET | Freq: Four times a day (QID) | ORAL | 0 refills | Status: AC

## 2022-09-27 NOTE — Assessment & Plan Note (Signed)
Order CT of abdomen/pelvis Given prednisone and benadryl for premedication due to allergy with contrast.

## 2022-09-27 NOTE — Assessment & Plan Note (Signed)
Order CTA of chest.  Given prednisone and benadryl for premedication due to allergy with contrast.

## 2022-09-27 NOTE — Assessment & Plan Note (Signed)
Intolerant to statins. 

## 2022-09-27 NOTE — Progress Notes (Signed)
Subjective:  Patient ID: Patricia Leonard, female    DOB: [birth year]  Age: 72 y.o. MRN: [id number]  Chief Complaint  Patient presents with   Hyperlipidemia   Depression    Hyperlipidemia:  Patient is currently not taking any medication for this. Patient's last labs were done 02/16/2022 with LDL of 125.  Pulmonary Embolism: Patient is currently taking Eliquis 5 mg take 1 tablet by mouth twice daily since 12/2021 found to have PE. Patient continues to have chest discomfort right lower chest wall and ruq (gallbladder removed.)  IBS with Constipation: Patient  has Bentyl 10 mg take 1 capsule 3 times daily PRN stomach cramps/pain, but she was unsure if she should take it with the other medications she is on, specifically reglan. Patient has been diagnosed with gastroparesis by gastroenterology. Patient has had a nissan fundoplication in early 9798. Patient is having episodic severe vomiting.  Went back on reglan 5 mg 1/2 once daily. She takes zofran which helps. Scheduled to return for colonoscopy.    Aortic atherosclerosis/hyperlipidemia: currently on no statin medicines due to development of muscle pain.      09/27/2022   10:31 AM 05/23/2022    9:57 AM 01/03/2022   10:59 AM  PHQ9 SCORE ONLY  PHQ-9 Total Score 2 2 0     Current Outpatient Medications on File Prior to Visit  Medication Sig Dispense Refill   Biotin 10 MG CAPS Take by mouth.     calcium-vitamin D (OSCAL WITH D) 500-200 MG-UNIT TABS tablet Take by mouth.     clotrimazole-betamethasone (LOTRISONE) cream Apply topically.     cyanocobalamin 1000 MCG tablet Take by mouth.     dicyclomine (BENTYL) 10 MG capsule Take 1 capsule (10 mg total) by mouth 3 (three) times daily before meals. As needed 90 capsule 8   ELIQUIS 5 MG TABS tablet TAKE 1 TABLET BY MOUTH TWICE A DAY 180 tablet 1   ketoconazole (NIZORAL) 2 % cream Apply to lower abdominal rash 15 g 0   magnesium hydroxide (MILK OF MAGNESIA) 400 MG/5ML suspension Take 15 mLs by  mouth daily as needed for mild constipation.     metoCLOPramide (REGLAN) 5 MG tablet Take 5 mg by mouth every 8 (eight) hours as needed for nausea.     Na Sulfate-K Sulfate-Mg Sulf 17.5-3.13-1.6 GM/177ML SOLN See admin instructions.     ondansetron (ZOFRAN) 4 MG tablet Take 4 mg by mouth every 8 (eight) hours as needed for nausea or vomiting.     pyridoxine (B-6) 100 MG tablet Take by mouth.     terconazole (TERAZOL 3) 0.8 % vaginal cream Insert 1 applicatorful every day by vaginal route for 5 days.     No current facility-administered medications on file prior to visit.   Past Medical History:  Diagnosis Date   Allergy    Anemia    Anxiety    Aortic atherosclerosis (HCC)    Arthritis    Cataract    Depression    Family history of colon cancer    Fibromyalgia    GERD (gastroesophageal reflux disease)    Hyperlipidemia    IBS (irritable bowel syndrome)    Moderate recurrent major depression ([place]) 08/05/2021   Rectal prolapse    Stomach ulcer    Tick bite    took antibiotics memorial day weekend 2021 on back   Past Surgical History:  Procedure Laterality Date   APPENDECTOMY     CESAREAN SECTION     CHOLECYSTECTOMY  COLONOSCOPY  04/18/2014   Diverticulosis.    ESOPHAGOGASTRODUODENOSCOPY  04/14/2016   Schatzkis ring. Hiatal hernia. Gastritis.    HERNIA REPAIR  11/07/2021   Hiatal   LIPOMA EXCISION     NISSEN FUNDOPLICATION  05/08/4817   [facility]   thermal ablation     TONSILLECTOMY      Family History  Problem Relation Age of Onset   Colon cancer Mother    Liver cancer Father    CAD Father    Hypertension Sister    Colon cancer Sister    Hyperlipidemia Brother    Gout Brother    Diabetes Maternal Grandmother    Diabetes Paternal Grandmother    Heart attack Other    Cancer Other        Leiomyosarcome and Liver   Migraines Other    Cancer Maternal Aunt        "female cancer, not breast"   Cancer Paternal Aunt         "female cancer, not breast"   Colon polyps Neg Hx    Esophageal cancer Neg Hx    Pancreatic cancer Neg Hx    Rectal cancer Neg Hx    Stomach cancer Neg Hx    Social History   Socioeconomic History   Marital status: Widowed    Spouse name: Not on file   Number of children: 2   Years of education: Not on file   Highest education level: Not on file  Occupational History   Not on file  Tobacco Use   Smoking status: Never   Smokeless tobacco: Never  Vaping Use   Vaping Use: Never used  Substance and Sexual Activity   Alcohol use: Never   Drug use: Never   Sexual activity: Not on file  Other Topics Concern   Not on file  Social History Narrative   Not on file   Social Determinants of Health   Financial Resource Strain: Not on file  Food Insecurity: No Food Insecurity (11/08/2020)   Hunger Vital Sign    Worried About Running Out of Food in the Last Year: Never true    Ran Out of Food in the Last Year: Never true  Transportation Needs: No Transportation Needs (11/08/2020)   PRAPARE - Hydrologist (Medical): No    Lack of Transportation (Non-Medical): No  Physical Activity: Not on file  Stress: Not on file  Social Connections: Not on file    Review of Systems  Constitutional:  Negative for appetite change, fatigue and fever.  HENT:  Positive for congestion (pt is concerned because her family has been sick and she has been around them. she does not know if they have been tested for anything.) and tinnitus (Been going on for several months). Negative for ear pain, sinus pressure and sore throat.   Respiratory:  Negative for cough, chest tightness, shortness of breath and wheezing.   Cardiovascular:  Negative for chest pain and palpitations.  Gastrointestinal:  Positive for nausea (Started back taking her Reglan). Negative for abdominal pain, constipation, diarrhea and vomiting.  Genitourinary:  Negative for dysuria and hematuria.  Musculoskeletal:   Negative for arthralgias, back pain, joint swelling and myalgias.  Skin:  Negative for rash.  Neurological:  Positive for dizziness (Occasionally). Negative for weakness and headaches.  Psychiatric/Behavioral:  Negative for dysphoric mood. The patient is not nervous/anxious.      Objective:  BP 138/82 (BP Location: Left Arm, Patient  Position: Sitting)   Pulse (!) 59   Temp (!) 97.4 F (36.3 C) (Temporal)   Ht 5' 6.5" (1.689 m)   Wt 209 lb (94.8 kg)   SpO2 98%   BMI 33.23 kg/m      09/27/2022   10:39 AM 08/16/2022   12:39 PM 06/19/2022   11:29 AM  BP/Weight  Systolic BP 166 063 016  Diastolic BP 82 82 72  Wt. (Lbs) 209 209.5 209  BMI 33.23 kg/m2 33.31 kg/m2 33.23 kg/m2    Physical Exam Vitals reviewed.  Constitutional:      Appearance: Normal appearance. She is normal weight.  HENT:     Right Ear: Tympanic membrane, ear canal and external ear normal.     Left Ear: Tympanic membrane and external ear normal.     Nose: Congestion present.     Mouth/Throat:     Pharynx: Oropharynx is clear.  Cardiovascular:     Rate and Rhythm: Normal rate and regular rhythm.     Heart sounds: Normal heart sounds. No murmur heard.    Comments: RIGHT LOWER CHEST WALL.  Pulmonary:     Effort: Pulmonary effort is normal. No respiratory distress.     Breath sounds: Normal breath sounds.  Abdominal:     General: Abdomen is flat. Bowel sounds are normal.     Palpations: Abdomen is soft.     Tenderness: There is abdominal tenderness (ruq).  Lymphadenopathy:     Cervical: Cervical adenopathy (RT ANTERIOR CERVICAL LAD.) present.  Neurological:     Mental Status: She is alert and oriented to person, place, and time.  Psychiatric:        Mood and Affect: Mood normal.        Behavior: Behavior normal.     Diabetic Foot Exam - Simple   No data filed      Lab Results  Component Value Date   WBC 4.0 05/23/2022   HGB 14.4 05/23/2022   HCT 42.8 05/23/2022   PLT 212 05/23/2022   GLUCOSE  95 05/23/2022   CHOL 216 (H) 05/23/2022   TRIG 132 05/23/2022   HDL 53 05/23/2022   LDLCALC 139 (H) 05/23/2022   ALT 21 05/23/2022   AST 24 05/23/2022   NA 141 05/23/2022   K 4.5 05/23/2022   CL 104 05/23/2022   CREATININE 0.83 05/23/2022   BUN 14 05/23/2022   CO2 25 05/23/2022   TSH 2.910 08/05/2021      Assessment & Plan:   Problem List Items Addressed This Visit       Cardiovascular and Mediastinum   Aortic atherosclerosis ([place])     Digestive   Irritable bowel syndrome with constipation   Relevant Medications   Na Sulfate-K Sulfate-Mg Sulf 17.5-3.13-1.6 GM/177ML SOLN   ondansetron (ZOFRAN) 4 MG tablet   metoCLOPramide (REGLAN) 5 MG tablet   Gastroparesis    Continue reglan. Keep gi appt. I recommended discussing need for EGD when has her upcoming Colonoscopy.      RESOLVED: GERD (gastroesophageal reflux disease)   Relevant Medications   Na Sulfate-K Sulfate-Mg Sulf 17.5-3.13-1.6 GM/177ML SOLN   ondansetron (ZOFRAN) 4 MG tablet   metoCLOPramide (REGLAN) 5 MG tablet     Other   Mixed hyperlipidemia - Primary    Recommend continue to work on eating healthy diet and exercise. Check lipids.       Relevant Orders   CBC with Differential/Platelet   Comprehensive metabolic panel   Lipid panel   BMI 33.0-33.9,adult  Myalgia due to statin    Intolerant to statins.       Vitamin D deficiency    Check vitamin D level.       Relevant Orders   VITAMIN D 25 Hydroxy (Vit-D Deficiency, Fractures)   History of pulmonary embolus (PE)   Relevant Orders   CT Angio Chest W/Cm &/Or Wo Cm   Prediabetes   Relevant Orders   Hemoglobin A1c   Needs flu shot   Relevant Orders   Flu Vaccine QUAD High Dose(Fluad) (Completed)   Chest wall pain    Order CTA of chest.  Given prednisone and benadryl for premedication due to allergy with contrast.       Relevant Medications   predniSONE (DELTASONE) 50 MG tablet   Other Relevant Orders   CT Angio Chest W/Cm &/Or Wo Cm    Right upper quadrant abdominal tenderness without rebound tenderness    Order CT of abdomen/pelvis Given prednisone and benadryl for premedication due to allergy with contrast.       Relevant Medications   predniSONE (DELTASONE) 50 MG tablet   Other Relevant Orders   CT ABDOMEN PELVIS W CONTRAST  .  Meds ordered this encounter  Medications   predniSONE (DELTASONE) 50 MG tablet    Sig: Take 1 tablet (50 mg total) by mouth every 6 (six) hours for 3 doses. sTART 13 HOURS PRIOR TO YOUR PROCEDURE    Dispense:  3 tablet    Refill:  0    Orders Placed This Encounter  Procedures   CT ABDOMEN PELVIS W CONTRAST   CT Angio Chest W/Cm &/Or Wo Cm   Flu Vaccine QUAD High Dose(Fluad)   CBC with Differential/Platelet   Comprehensive metabolic panel   Lipid panel   VITAMIN D 25 Hydroxy (Vit-D Deficiency, Fractures)   Hemoglobin A1c    Total time spent on today's visit was greater than 30 minutes, including both face-to-face time and nonface-to-face time personally spent on review of chart (labs and imaging), discussing labs and goals, discussing further work-up, treatment options, referrals to specialist if needed, reviewing outside records of pertinent, answering patient's questions, and coordinating care.  Follow-up: Return in about 3 months (around 12/27/2022) for chronic fasting.  An After Visit Summary was printed and given to the patient.   I,Lauren M Auman,acting as a scribe for Rochel Brome, MD.,have documented all relevant documentation on the behalf of Rochel Brome, MD,as directed by  Rochel Brome, MD while in the presence of Rochel Brome, MD.    Rochel Brome, MD [place] [phone]

## 2022-09-27 NOTE — Assessment & Plan Note (Signed)
Recommend continue to work on eating healthy diet and exercise. Check lipids.

## 2022-09-27 NOTE — Assessment & Plan Note (Signed)
Check vitamin D level 

## 2022-09-27 NOTE — Assessment & Plan Note (Signed)
Continue reglan. Keep gi appt. I recommended discussing need for EGD when has her upcoming Colonoscopy.

## 2022-09-27 NOTE — Patient Instructions (Signed)
START PREDNISONE 50 MG ONE EVERY 6 HOURS STARTING 13 HOURS PRIOR TO THE PROCEDURE.  TAKE BENADRYL 50 MG OTC ONE HOUR PRIOR TO CONTRAST.

## 2022-09-28 DIAGNOSIS — I471 Supraventricular tachycardia, unspecified: Secondary | ICD-10-CM | POA: Diagnosis not present

## 2022-09-28 DIAGNOSIS — I358 Other nonrheumatic aortic valve disorders: Secondary | ICD-10-CM | POA: Diagnosis not present

## 2022-09-28 DIAGNOSIS — G4733 Obstructive sleep apnea (adult) (pediatric): Secondary | ICD-10-CM | POA: Diagnosis not present

## 2022-09-28 DIAGNOSIS — E785 Hyperlipidemia, unspecified: Secondary | ICD-10-CM | POA: Diagnosis not present

## 2022-09-28 LAB — COMPREHENSIVE METABOLIC PANEL
ALT: 16 IU/L (ref 0–32)
AST: 19 IU/L (ref 0–40)
Albumin/Globulin Ratio: 1.6 (ref 1.2–2.2)
Albumin: 4.1 g/dL (ref 3.8–4.8)
Alkaline Phosphatase: 100 IU/L (ref 44–121)
BUN/Creatinine Ratio: 15 (ref 12–28)
BUN: 13 mg/dL (ref 8–27)
Bilirubin Total: 0.5 mg/dL (ref 0.0–1.2)
CO2: 24 mmol/L (ref 20–29)
Calcium: 9.5 mg/dL (ref 8.7–10.3)
Chloride: 101 mmol/L (ref 96–106)
Creatinine, Ser: 0.84 mg/dL (ref 0.57–1.00)
Globulin, Total: 2.5 g/dL (ref 1.5–4.5)
Glucose: 91 mg/dL (ref 70–99)
Potassium: 4.4 mmol/L (ref 3.5–5.2)
Sodium: 139 mmol/L (ref 134–144)
Total Protein: 6.6 g/dL (ref 6.0–8.5)
eGFR: 73 mL/min/{1.73_m2} (ref >59)

## 2022-09-28 LAB — CBC WITH DIFFERENTIAL/PLATELET
Basophils Absolute: 0 10*3/uL (ref 0.0–0.2)
Basos: 1 %
EOS (ABSOLUTE): 0 10*3/uL (ref 0.0–0.4)
Eos: 1 %
Hematocrit: 43.4 % (ref 34.0–46.6)
Hemoglobin: 14.7 g/dL (ref 11.1–15.9)
Immature Grans (Abs): 0 10*3/uL (ref 0.0–0.1)
Immature Granulocytes: 0 %
Lymphocytes Absolute: 1.2 10*3/uL (ref 0.7–3.1)
Lymphs: 31 %
MCH: 29.9 pg (ref 26.6–33.0)
MCHC: 33.9 g/dL (ref 31.5–35.7)
MCV: 88 fL (ref 79–97)
Monocytes Absolute: 0.2 10*3/uL (ref 0.1–0.9)
Monocytes: 5 %
Neutrophils Absolute: 2.4 10*3/uL (ref 1.4–7.0)
Neutrophils: 62 %
Platelets: 202 10*3/uL (ref 150–450)
RBC: 4.91 x10E6/uL (ref 3.77–5.28)
RDW: 12.6 % (ref 11.7–15.4)
WBC: 3.9 10*3/uL (ref 3.4–10.8)

## 2022-09-28 LAB — HEMOGLOBIN A1C
Est. average glucose Bld gHb Est-mCnc: 126 mg/dL
Hgb A1c MFr Bld: 6 % — ABNORMAL HIGH (ref 4.8–5.6)

## 2022-09-28 LAB — LIPID PANEL
Chol/HDL Ratio: 4.1 ratio (ref 0.0–4.4)
Cholesterol, Total: 240 mg/dL — ABNORMAL HIGH (ref 100–199)
HDL: 58 mg/dL (ref >39)
LDL Chol Calc (NIH): 156 mg/dL — ABNORMAL HIGH (ref 0–99)
Triglycerides: 143 mg/dL (ref 0–149)
VLDL Cholesterol Cal: 26 mg/dL (ref 5–40)

## 2022-09-28 LAB — VITAMIN D 25 HYDROXY (VIT D DEFICIENCY, FRACTURES): Vit D, 25-Hydroxy: 19.3 ng/mL — ABNORMAL LOW (ref 30.0–100.0)

## 2022-09-28 LAB — CARDIOVASCULAR RISK ASSESSMENT

## 2022-09-28 NOTE — Progress Notes (Signed)
Blood count normal.  Liver function normal.  Kidney function normal.  Cholesterol: LDL very high at 156. Recommend repatha 140 mg IM shot every 14 days. Patient has aortic atherosclerosis which showed on ct scans. HBA1C:  6.0. Vitamin D low. Recommend vitamin D 50K once weekly.

## 2022-09-29 ENCOUNTER — Encounter: Payer: Self-pay | Admitting: Family Medicine

## 2022-10-03 ENCOUNTER — Other Ambulatory Visit: Payer: Self-pay | Admitting: Family Medicine

## 2022-10-03 MED ORDER — VITAMIN D (ERGOCALCIFEROL) 1.25 MG (50000 UNIT) PO CAPS: 50000.0000 [IU] | ORAL_CAPSULE | ORAL | 0 refills | Status: DC

## 2022-10-04 DIAGNOSIS — I700 Atherosclerosis of aorta: Secondary | ICD-10-CM | POA: Diagnosis not present

## 2022-10-04 DIAGNOSIS — R0789 Other chest pain: Secondary | ICD-10-CM | POA: Diagnosis not present

## 2022-10-04 DIAGNOSIS — Z86711 Personal history of pulmonary embolism: Secondary | ICD-10-CM | POA: Diagnosis not present

## 2022-10-04 DIAGNOSIS — K5730 Diverticulosis of large intestine without perforation or abscess without bleeding: Secondary | ICD-10-CM | POA: Diagnosis not present

## 2022-10-04 DIAGNOSIS — R10811 Right upper quadrant abdominal tenderness: Secondary | ICD-10-CM | POA: Diagnosis not present

## 2022-10-05 ENCOUNTER — Telehealth: Payer: Self-pay | Admitting: Physician Assistant

## 2022-10-05 NOTE — Telephone Encounter (Signed)
Patient is calling states she would like to speak with Patricia Leonard regarding her recent CAT scan. Please advise

## 2022-10-05 NOTE — Telephone Encounter (Signed)
Patient called in stating she has been experiencing right flank pain that radiates to her back & her PCP ordered a CT. PCP has not contacted her yet regarding the results. She wanted to know if the CT could replace the colonoscopy that she is currently scheduled for. She's been advised that the colon was scheduled for screening purposes d/t her history of polyps and family history, and that she should wait to hear from her PCP to discuss further recommendations based on CT results. She verbalized all understanding.

## 2022-10-11 ENCOUNTER — Other Ambulatory Visit: Payer: Self-pay | Admitting: Family Medicine

## 2022-10-13 ENCOUNTER — Encounter: Payer: PPO | Admitting: Gastroenterology

## 2022-10-16 ENCOUNTER — Other Ambulatory Visit: Payer: Self-pay

## 2022-10-16 DIAGNOSIS — E042 Nontoxic multinodular goiter: Secondary | ICD-10-CM

## 2022-10-16 DIAGNOSIS — R0789 Other chest pain: Secondary | ICD-10-CM

## 2022-10-16 DIAGNOSIS — Z86711 Personal history of pulmonary embolism: Secondary | ICD-10-CM

## 2022-10-17 DIAGNOSIS — E785 Hyperlipidemia, unspecified: Secondary | ICD-10-CM | POA: Diagnosis not present

## 2022-10-17 DIAGNOSIS — I358 Other nonrheumatic aortic valve disorders: Secondary | ICD-10-CM | POA: Diagnosis not present

## 2022-10-17 DIAGNOSIS — R002 Palpitations: Secondary | ICD-10-CM | POA: Diagnosis not present

## 2022-10-17 DIAGNOSIS — R0789 Other chest pain: Secondary | ICD-10-CM | POA: Diagnosis not present

## 2022-10-17 DIAGNOSIS — G4733 Obstructive sleep apnea (adult) (pediatric): Secondary | ICD-10-CM | POA: Diagnosis not present

## 2022-10-17 DIAGNOSIS — R001 Bradycardia, unspecified: Secondary | ICD-10-CM | POA: Diagnosis not present

## 2022-10-20 ENCOUNTER — Telehealth: Payer: Self-pay | Admitting: Physician Assistant

## 2022-10-20 ENCOUNTER — Other Ambulatory Visit: Payer: Self-pay

## 2022-10-20 DIAGNOSIS — K3184 Gastroparesis: Secondary | ICD-10-CM

## 2022-10-20 DIAGNOSIS — R110 Nausea: Secondary | ICD-10-CM

## 2022-10-20 NOTE — Telephone Encounter (Signed)
Inbound call from patient ,requesting to speak with a nurse in regards a Endoscopy that her primary Care provider suggested

## 2022-10-20 NOTE — Telephone Encounter (Signed)
EGD added per Vicie Mutters PA, pt aware, amb ref in epic.

## 2022-10-20 NOTE — Telephone Encounter (Signed)
Pt states she saw her PCP and her PCP wanted to know if she was going to have an EGD along with colon. Right now she is just scheduled for Colon. PCP feels since she had fundoplication surgery she has not been doing well, she has developed gastroparesis and has unexplained pain in her side. Please advise regarding adding EGD on for pt.  Pt also wanted to let Estill Bamberg know that she has started taking 1/2 tab of reglan now.

## 2022-10-28 ENCOUNTER — Other Ambulatory Visit: Payer: Self-pay | Admitting: Physician Assistant

## 2022-10-28 DIAGNOSIS — R110 Nausea: Secondary | ICD-10-CM

## 2022-11-05 DIAGNOSIS — R9431 Abnormal electrocardiogram [ECG] [EKG]: Secondary | ICD-10-CM | POA: Diagnosis not present

## 2022-11-05 DIAGNOSIS — R002 Palpitations: Secondary | ICD-10-CM | POA: Diagnosis not present

## 2022-11-05 DIAGNOSIS — Z86711 Personal history of pulmonary embolism: Secondary | ICD-10-CM | POA: Diagnosis not present

## 2022-11-05 DIAGNOSIS — R519 Headache, unspecified: Secondary | ICD-10-CM | POA: Diagnosis not present

## 2022-11-05 DIAGNOSIS — E7800 Pure hypercholesterolemia, unspecified: Secondary | ICD-10-CM | POA: Diagnosis not present

## 2022-11-08 ENCOUNTER — Encounter: Payer: PPO | Admitting: Gastroenterology

## 2022-11-09 ENCOUNTER — Telehealth: Payer: Self-pay

## 2022-11-09 DIAGNOSIS — R002 Palpitations: Secondary | ICD-10-CM | POA: Diagnosis not present

## 2022-11-09 NOTE — Telephone Encounter (Signed)
Left message for patient to call back to schedule Medicare Annual Wellness Visit   Last AWV  01/19/21  Please schedule with Rhae Hammock, LPN if patient calls the office back.    30 Minutes appointment   Any questions, please call me at [phone]

## 2022-11-10 DIAGNOSIS — I4719 Other supraventricular tachycardia: Secondary | ICD-10-CM | POA: Diagnosis not present

## 2022-11-13 ENCOUNTER — Telehealth: Payer: Self-pay

## 2022-11-13 NOTE — Telephone Encounter (Signed)
Patient called and stated that she was seen at [facility] for a headache and when she got there her Blood pressure and they did CT scan and it was normal. Patient stated she was feeling better and didn't know if she needed to come in for a follow up, he blood pressure is running 130s/80s. Offer her an appointment for Monday the 26th, she is not able to do appointments before lunch due to keeping her grandson. Please advise

## 2022-11-15 ENCOUNTER — Telehealth: Payer: Self-pay

## 2022-11-15 NOTE — Telephone Encounter (Signed)
Patient needs an afternoon appointment.

## 2022-11-15 NOTE — Telephone Encounter (Signed)
Patient called and stated that her blood pressure has been high and having palpitations and just not feeling well.  She went to the ER on Feb 11 and patient states that she sat there for 4 hours and they only told her to follow up with her PCP.  We have no availability this week so I suggested [facility] [place] or [place].  She said she would go if she gets worse.  Also she would like to get a 2 night sleep study asap per her cardiology.

## 2022-11-16 NOTE — Telephone Encounter (Signed)
Called patient to see if she could come in today to be seen, left message for patient to call office

## 2022-11-20 DIAGNOSIS — E042 Nontoxic multinodular goiter: Secondary | ICD-10-CM | POA: Diagnosis not present

## 2022-11-20 DIAGNOSIS — E041 Nontoxic single thyroid nodule: Secondary | ICD-10-CM | POA: Diagnosis not present

## 2022-11-21 ENCOUNTER — Telehealth: Payer: Self-pay | Admitting: *Deleted

## 2022-11-21 ENCOUNTER — Other Ambulatory Visit: Payer: Self-pay

## 2022-11-21 ENCOUNTER — Ambulatory Visit: Payer: PPO

## 2022-11-21 DIAGNOSIS — E042 Nontoxic multinodular goiter: Secondary | ICD-10-CM

## 2022-11-21 NOTE — Progress Notes (Signed)
[facility] BIOPSY OF THYROID FOR [place]. CAN'T PLACE ORDER IN PATIENT'S CHART DUE TO NO EXTERNAL OPTION IN ORDER.

## 2022-11-21 NOTE — Telephone Encounter (Signed)
Called pt.to do pre-visit and she stated that "I need to have a biopsy on thyroid and  I have been having  irregular heart rhythm they have ordered a echo for my heart , my blood pressure has been elevated and I am weary about being put to sleep with all of this going on".she decided see would like to have her echo done before proceeding with egd/colon,will call back and schedule when she is ready,procedure 12/19/22 canceled per pt.request.

## 2022-11-23 ENCOUNTER — Telehealth: Payer: Self-pay | Admitting: Family Medicine

## 2022-11-23 ENCOUNTER — Encounter: Payer: Self-pay | Admitting: Family Medicine

## 2022-11-23 ENCOUNTER — Ambulatory Visit (INDEPENDENT_AMBULATORY_CARE_PROVIDER_SITE_OTHER): Payer: PPO | Admitting: Family Medicine

## 2022-11-23 VITALS — BP 138/82 | HR 60 | Temp 97.2°F | Ht 66.5 in | Wt 206.0 lb

## 2022-11-23 DIAGNOSIS — R519 Headache, unspecified: Secondary | ICD-10-CM | POA: Diagnosis not present

## 2022-11-23 DIAGNOSIS — R002 Palpitations: Secondary | ICD-10-CM

## 2022-11-23 DIAGNOSIS — M7711 Lateral epicondylitis, right elbow: Secondary | ICD-10-CM

## 2022-11-23 DIAGNOSIS — K219 Gastro-esophageal reflux disease without esophagitis: Secondary | ICD-10-CM | POA: Diagnosis not present

## 2022-11-23 DIAGNOSIS — G4733 Obstructive sleep apnea (adult) (pediatric): Secondary | ICD-10-CM | POA: Diagnosis not present

## 2022-11-23 DIAGNOSIS — Z86711 Personal history of pulmonary embolism: Secondary | ICD-10-CM | POA: Diagnosis not present

## 2022-11-23 DIAGNOSIS — M25511 Pain in right shoulder: Secondary | ICD-10-CM

## 2022-11-23 DIAGNOSIS — G8929 Other chronic pain: Secondary | ICD-10-CM | POA: Diagnosis not present

## 2022-11-23 DIAGNOSIS — E041 Nontoxic single thyroid nodule: Secondary | ICD-10-CM

## 2022-11-23 NOTE — Progress Notes (Signed)
Acute Office Visit  Subjective:    Patient ID: Patricia Leonard, female    DOB: [birth year], 72 y.o.   MRN: [id number]  Chief Complaint  Patient presents with   Headache   Right shoulder pain   HPI: Patient is in today for right shoulder pain x 6+ months, pain radiates down arm into hand at times. Pain is described as achy. Topical treatments offer some relief, states she can hear her shoulder "pop".  Was having headaches very often for 2 weeks. Went to ER who told her to f/u with PCP.  BP was very high. Wore a heart monitor and results suggest she have a sleep study.   States she is having bx's on some nodules on her thyroid.  Patient has a history of PE in 12/2021. Patient was to be on NOAC for 6 months.  Past Medical History:  Diagnosis Date   Allergy    Anemia    Anxiety    Aortic atherosclerosis (HCC)    Arthritis    Cataract    Depression    Family history of colon cancer    Fibromyalgia    GERD (gastroesophageal reflux disease)    Hyperlipidemia    IBS (irritable bowel syndrome)    Moderate recurrent major depression ([place]) 08/05/2021   Rectal prolapse    Stomach ulcer    Tick bite    took antibiotics memorial day weekend 2021 on back    Past Surgical History:  Procedure Laterality Date   APPENDECTOMY     CESAREAN SECTION     CHOLECYSTECTOMY     COLONOSCOPY  04/18/2014   Diverticulosis.    ESOPHAGOGASTRODUODENOSCOPY  04/14/2016   Schatzkis ring. Hiatal hernia. Gastritis.    HERNIA REPAIR  11/07/2021   Hiatal   LIPOMA EXCISION     NISSEN FUNDOPLICATION  [id number]   [facility]   thermal ablation     TONSILLECTOMY      Family History  Problem Relation Age of Onset   Colon cancer Mother    Liver cancer Father    CAD Father    Hypertension Sister    Colon cancer Sister    Hyperlipidemia Brother    Gout Brother    Diabetes Maternal Grandmother    Diabetes Paternal Grandmother    Heart attack Other    Cancer Other         Leiomyosarcome and Liver   Migraines Other    Cancer Maternal Aunt        "female cancer, not breast"   Cancer Paternal Aunt        "female cancer, not breast"   Colon polyps Neg Hx    Esophageal cancer Neg Hx    Pancreatic cancer Neg Hx    Rectal cancer Neg Hx    Stomach cancer Neg Hx     Social History   Socioeconomic History   Marital status: Widowed    Spouse name: Not on file   Number of children: 2   Years of education: Not on file   Highest education level: Not on file  Occupational History   Not on file  Tobacco Use   Smoking status: Never   Smokeless tobacco: Never  Vaping Use   Vaping Use: Never used  Substance and Sexual Activity   Alcohol use: Never   Drug use: Never   Sexual activity: Not on file  Other Topics Concern   Not on file  Social History Narrative  Not on file   Social Determinants of Health   Financial Resource Strain: Not on file  Food Insecurity: No Food Insecurity (11/08/2020)   Hunger Vital Sign    Worried About Running Out of Food in the Last Year: Never true    Ran Out of Food in the Last Year: Never true  Transportation Needs: No Transportation Needs (11/08/2020)   PRAPARE - Hydrologist (Medical): No    Lack of Transportation (Non-Medical): No  Physical Activity: Not on file  Stress: Not on file  Social Connections: Not on file  Intimate Partner Violence: Not on file    Outpatient Medications Prior to Visit  Medication Sig Dispense Refill   Biotin 10 MG CAPS Take by mouth.     calcium-vitamin D (OSCAL WITH D) 500-200 MG-UNIT TABS tablet Take by mouth.     clotrimazole-betamethasone (LOTRISONE) cream Apply topically.     cyanocobalamin 1000 MCG tablet Take by mouth.     dicyclomine (BENTYL) 10 MG capsule Take 1 capsule (10 mg total) by mouth 3 (three) times daily before meals. As needed 90 capsule 8   ELIQUIS 5 MG TABS tablet TAKE 1 TABLET BY MOUTH TWICE A DAY 180 tablet 1   ketoconazole  (NIZORAL) 2 % cream Apply to lower abdominal rash 15 g 0   magnesium hydroxide (MILK OF MAGNESIA) 400 MG/5ML suspension Take 15 mLs by mouth daily as needed for mild constipation.     metoCLOPramide (REGLAN) 5 MG tablet Take 1 tablet (5 mg total) by mouth every 8 (eight) hours as needed for nausea or vomiting. 60 tablet 1   Na Sulfate-K Sulfate-Mg Sulf 17.5-3.13-1.6 GM/177ML SOLN See admin instructions.     ondansetron (ZOFRAN) 4 MG tablet Take 4 mg by mouth every 8 (eight) hours as needed for nausea or vomiting.     pyridoxine (B-6) 100 MG tablet Take by mouth.     Vitamin D, Ergocalciferol, (DRISDOL) 1.25 MG (50000 UNIT) CAPS capsule Take 1 capsule (50,000 Units total) by mouth every 7 (seven) days. 12 capsule 0   terconazole (TERAZOL 3) 0.8 % vaginal cream Insert 1 applicatorful every day by vaginal route for 5 days.     No facility-administered medications prior to visit.    Allergies  Allergen Reactions   Statins     Muscle pain   Amoxicillin-Pot Clavulanate Nausea Only    Stomach pain    Gadolinium Derivatives Rash   Iodinated Contrast Media Other (See Comments) and Rash     Desc: HAD FACIAL RASH WITH CT CONTRAST IN 2006 HAD FACIAL RASH WITH CT CONTRAST IN 2006  Desc: HAD FACIAL RASH WITH CT CONTRAST IN 2006    Iohexol Rash    HAD FACIAL RASH WITH CT CONTRAST IN 2006     Review of Systems  Constitutional:  Negative for chills, fatigue and fever.  HENT:  Negative for congestion, ear pain, rhinorrhea and sore throat.   Respiratory:  Negative for cough and shortness of breath.   Cardiovascular:  Negative for chest pain.  Gastrointestinal:  Negative for abdominal pain, constipation, diarrhea, nausea and vomiting.  Musculoskeletal:  Negative for back pain.       Shoulder pain  Neurological:  Negative for dizziness and headaches.       Objective:        11/23/2022    1:42 PM 09/27/2022   10:39 AM 08/16/2022   12:39 PM  Vitals with BMI  Height 5' 6.5" 5' 6.5" 5'  6.5"   Weight 206 lbs 209 lbs 209 lbs 8 oz  BMI 32.76 [id number]  Systolic 0000000 0000000 [id number]  Diastolic 82 82 82  Pulse 60 59 95    No data found.   Physical Exam Vitals reviewed.  Constitutional:      Appearance: Normal appearance. She is normal weight.  Neck:     Vascular: No carotid bruit.  Cardiovascular:     Rate and Rhythm: Normal rate and regular rhythm.     Heart sounds: Normal heart sounds.  Pulmonary:     Effort: Pulmonary effort is normal. No respiratory distress.     Breath sounds: Normal breath sounds.  Abdominal:     General: Abdomen is flat. Bowel sounds are normal.     Palpations: Abdomen is soft.     Tenderness: There is no abdominal tenderness.  Musculoskeletal:        General: Tenderness (rt medial and lateral epicondyles.) present.  Neurological:     Mental Status: She is alert and oriented to person, place, and time.  Psychiatric:        Mood and Affect: Mood normal.        Behavior: Behavior normal.     Health Maintenance Due  Topic Date Due   Hepatitis C Screening  Never done   DTaP/Tdap/Td (1 - Tdap) Never done   Zoster Vaccines- Shingrix (1 of 2) Never done   Pneumonia Vaccine 14+ Years old (1 of 1 - PCV) Never done   Medicare Annual Wellness (AWV)  01/19/2022    There are no preventive care reminders to display for this patient.   Lab Results  Component Value Date   TSH 2.910 08/05/2021   Lab Results  Component Value Date   WBC 3.9 09/27/2022   HGB 14.7 09/27/2022   HCT 43.4 09/27/2022   MCV 88 09/27/2022   PLT 202 09/27/2022   Lab Results  Component Value Date   NA 139 09/27/2022   K 4.4 09/27/2022   CO2 24 09/27/2022   GLUCOSE 91 09/27/2022   BUN 13 09/27/2022   CREATININE 0.84 09/27/2022   BILITOT 0.5 09/27/2022   ALKPHOS 100 09/27/2022   AST 19 09/27/2022   ALT 16 09/27/2022   PROT 6.6 09/27/2022   ALBUMIN 4.1 09/27/2022   CALCIUM 9.5 09/27/2022   EGFR 73 09/27/2022   GFR 65.95 01/19/2020   Lab Results  Component Value  Date   CHOL 240 (H) 09/27/2022   Lab Results  Component Value Date   HDL 58 09/27/2022   Lab Results  Component Value Date   LDLCALC 156 (H) 09/27/2022   Lab Results  Component Value Date   TRIG 143 09/27/2022   Lab Results  Component Value Date   CHOLHDL 4.1 09/27/2022   Lab Results  Component Value Date   HGBA1C 6.0 (H) 09/27/2022       Assessment & Plan:  Palpitations Assessment & Plan: Order sleep study  Orders: -     Home sleep test; Future  Chronic right shoulder pain Assessment & Plan: Add diclofenac gel (voltaren) apply 4 times per day.  Recommend rest your right arm.     Thyroid nodule Assessment & Plan: Discussed biopsy   Frequent headaches  Lateral epicondylitis of right elbow Assessment & Plan: Add diclofenac gel (voltaren) apply 4 times per day.  Recommend rest your right arm.  Use tennis elbow brace,    OSA (obstructive sleep apnea) Assessment & Plan: Order home sleep study  Gastroesophageal reflux disease without esophagitis Assessment & Plan: Start omeprazole 40 mg once daily.     History of pulmonary embolism Assessment & Plan: Stop eliquis. Start baby aspirin 81 mg daily.       No orders of the defined types were placed in this encounter.   Orders Placed This Encounter  Procedures   Home sleep test     Follow-up: Return in about 3 months (around 02/21/2023) for chronic follow up.  An After Visit Summary was printed and given to the patient.  Rochel Brome, MD [facility] [phone]

## 2022-11-23 NOTE — Telephone Encounter (Signed)
   Patricia Leonard has been scheduled for the following appointment:  WHAT: THYROID BIOPSY WHERE: [facility] DATE: 11/28/22 TIME: 12:30 PM CHECK-IN   Patient has been made aware.

## 2022-11-23 NOTE — Patient Instructions (Addendum)
Stop eliquis. Start baby aspirin 81 mg daily.   Start omeprazole 40 mg once daily.    Add diclofenac gel (voltaren) apply 4 times per day.  Recommend rest your right arm.  Use tennis elbow brace,

## 2022-11-26 DIAGNOSIS — E041 Nontoxic single thyroid nodule: Secondary | ICD-10-CM | POA: Insufficient documentation

## 2022-11-26 DIAGNOSIS — R519 Headache, unspecified: Secondary | ICD-10-CM | POA: Insufficient documentation

## 2022-11-26 DIAGNOSIS — M7711 Lateral epicondylitis, right elbow: Secondary | ICD-10-CM | POA: Insufficient documentation

## 2022-11-26 DIAGNOSIS — G8929 Other chronic pain: Secondary | ICD-10-CM | POA: Insufficient documentation

## 2022-11-26 NOTE — Assessment & Plan Note (Signed)
Start omeprazole 40 mg once daily.

## 2022-11-26 NOTE — Assessment & Plan Note (Signed)
Add diclofenac gel (voltaren) apply 4 times per day.  Recommend rest your right arm.  Use tennis elbow brace,

## 2022-11-26 NOTE — Assessment & Plan Note (Signed)
Discussed biopsy

## 2022-11-26 NOTE — Assessment & Plan Note (Signed)
Stop eliquis. Start baby aspirin 81 mg daily.

## 2022-11-26 NOTE — Assessment & Plan Note (Signed)
Add diclofenac gel (voltaren) apply 4 times per day.  Recommend rest your right arm.

## 2022-11-26 NOTE — Assessment & Plan Note (Signed)
Order home sleep study

## 2022-11-26 NOTE — Assessment & Plan Note (Signed)
Order sleep study

## 2022-11-28 DIAGNOSIS — E042 Nontoxic multinodular goiter: Secondary | ICD-10-CM | POA: Diagnosis not present

## 2022-12-04 DIAGNOSIS — R0602 Shortness of breath: Secondary | ICD-10-CM | POA: Diagnosis not present

## 2022-12-04 DIAGNOSIS — G4733 Obstructive sleep apnea (adult) (pediatric): Secondary | ICD-10-CM | POA: Diagnosis not present

## 2022-12-07 DIAGNOSIS — G4733 Obstructive sleep apnea (adult) (pediatric): Secondary | ICD-10-CM | POA: Diagnosis not present

## 2022-12-07 DIAGNOSIS — R0602 Shortness of breath: Secondary | ICD-10-CM | POA: Diagnosis not present

## 2022-12-15 ENCOUNTER — Other Ambulatory Visit: Payer: Self-pay

## 2022-12-15 DIAGNOSIS — R002 Palpitations: Secondary | ICD-10-CM

## 2022-12-18 ENCOUNTER — Telehealth: Payer: Self-pay

## 2022-12-18 DIAGNOSIS — H2513 Age-related nuclear cataract, bilateral: Secondary | ICD-10-CM | POA: Diagnosis not present

## 2022-12-18 DIAGNOSIS — H40053 Ocular hypertension, bilateral: Secondary | ICD-10-CM | POA: Diagnosis not present

## 2022-12-18 NOTE — Telephone Encounter (Signed)
Left message for patient to call our office, per Dr. Tobie Poet results were "very mild recommends weight loss vs cpap".

## 2022-12-19 ENCOUNTER — Encounter: Payer: PPO | Admitting: Gastroenterology

## 2022-12-22 DIAGNOSIS — R300 Dysuria: Secondary | ICD-10-CM | POA: Diagnosis not present

## 2022-12-22 DIAGNOSIS — M791 Myalgia, unspecified site: Secondary | ICD-10-CM | POA: Diagnosis not present

## 2022-12-22 DIAGNOSIS — R051 Acute cough: Secondary | ICD-10-CM | POA: Diagnosis not present

## 2022-12-22 DIAGNOSIS — B355 Tinea imbricata: Secondary | ICD-10-CM | POA: Diagnosis not present

## 2022-12-22 DIAGNOSIS — J069 Acute upper respiratory infection, unspecified: Secondary | ICD-10-CM | POA: Diagnosis not present

## 2022-12-22 DIAGNOSIS — R8281 Pyuria: Secondary | ICD-10-CM | POA: Diagnosis not present

## 2022-12-22 DIAGNOSIS — R509 Fever, unspecified: Secondary | ICD-10-CM | POA: Diagnosis not present

## 2023-01-01 DIAGNOSIS — I361 Nonrheumatic tricuspid (valve) insufficiency: Secondary | ICD-10-CM | POA: Diagnosis not present

## 2023-01-02 DIAGNOSIS — I7121 Aneurysm of the ascending aorta, without rupture: Secondary | ICD-10-CM | POA: Insufficient documentation

## 2023-01-22 ENCOUNTER — Ambulatory Visit (INDEPENDENT_AMBULATORY_CARE_PROVIDER_SITE_OTHER): Payer: PPO | Admitting: Family Medicine

## 2023-01-22 VITALS — BP 134/80 | HR 60 | Temp 98.3°F | Resp 14 | Ht 66.5 in | Wt 206.0 lb

## 2023-01-22 DIAGNOSIS — L299 Pruritus, unspecified: Secondary | ICD-10-CM

## 2023-01-22 DIAGNOSIS — T466X5A Adverse effect of antihyperlipidemic and antiarteriosclerotic drugs, initial encounter: Secondary | ICD-10-CM

## 2023-01-22 DIAGNOSIS — M791 Myalgia, unspecified site: Secondary | ICD-10-CM

## 2023-01-22 DIAGNOSIS — E782 Mixed hyperlipidemia: Secondary | ICD-10-CM | POA: Diagnosis not present

## 2023-01-22 DIAGNOSIS — R7303 Prediabetes: Secondary | ICD-10-CM | POA: Diagnosis not present

## 2023-01-22 DIAGNOSIS — I10 Essential (primary) hypertension: Secondary | ICD-10-CM | POA: Diagnosis not present

## 2023-01-22 DIAGNOSIS — K219 Gastro-esophageal reflux disease without esophagitis: Secondary | ICD-10-CM

## 2023-01-22 DIAGNOSIS — I700 Atherosclerosis of aorta: Secondary | ICD-10-CM | POA: Diagnosis not present

## 2023-01-22 MED ORDER — HYDROXYZINE PAMOATE 25 MG PO CAPS: 25.0000 mg | ORAL_CAPSULE | Freq: Three times a day (TID) | ORAL | 0 refills | Status: DC | PRN

## 2023-01-22 NOTE — Progress Notes (Unsigned)
Subjective:  Patient ID: Patricia Leonard, female    DOB: [birth year]  Age: 72 y.o. MRN: [id number]  Chief Complaint  Patient presents with   Hyperlipidemia    HPI Hyperlipidemia:  Patient is currently not taking any medication for this. She is taking citrus bergomat OTC.  Patient's last labs were done 09/27/2022 with LDL of 156.  IBS with Constipation: Patient has Bentyl 10 mg take 1 capsule 3 times daily PRN stomach cramps/pain, but she was unsure if she should take it with the other medications she is on, specifically reglan. Patient has been diagnosed with gastroparesis by gastroenterology. On reglan 5 mg 1/2 once daily. She takes zofran which helps. Scheduled to return for colonoscopy.    Aortic atherosclerosis/hyperlipidemia: currently on no statin medicines due to development of muscle pain.       09/27/2022   10:31 AM 05/23/2022    9:57 AM 01/03/2022   10:59 AM 08/05/2021    7:38 AM 06/27/2021    9:38 AM  Depression screen PHQ 2/9  Decreased Interest 0 0 0 1 0  Down, Depressed, Hopeless 0 0 0 2 0  PHQ - 2 Score 0 0 0 3 0  Altered sleeping 1 0  3   Tired, decreased energy 1 0  2   Change in appetite 0 1  2   Feeling bad or failure about yourself  0 1  2   Trouble concentrating 0 0  1   Moving slowly or fidgety/restless 0 0  0   Suicidal thoughts 0 0  1   PHQ-9 Score 2 2  14    Difficult doing work/chores Not difficult at all Somewhat difficult           01/19/2021    4:03 PM 06/27/2021    9:38 AM 07/04/2021    3:19 PM 01/03/2022   10:59 AM 09/27/2022   10:41 AM  Fall Risk  Falls in the past year? 0 0 0 0 0  Was there an injury with Fall? 0 0 0 0 0  Fall Risk Category Calculator 0 0 0 0 0  Fall Risk Category (Retired) Low Low Low Low Low  (RETIRED) Patient Fall Risk Level Low fall risk Low fall risk Low fall risk Low fall risk Low fall risk  Patient at Risk for Falls Due to No Fall Risks No Fall Risks No Fall Risks  No Fall Risks  Fall risk Follow up Falls evaluation  completed;Falls prevention discussed Falls evaluation completed Falls evaluation completed  Falls evaluation completed      Review of Systems  Constitutional:  Negative for chills, fatigue and fever.  HENT:  Negative for congestion, rhinorrhea and sore throat.   Respiratory:  Negative for cough and shortness of breath.   Cardiovascular:  Negative for chest pain and leg swelling.  Gastrointestinal:  Positive for abdominal pain and nausea. Negative for constipation, diarrhea and vomiting.  Genitourinary:  Negative for dysuria and urgency.  Musculoskeletal:  Negative for back pain and myalgias.  Skin:        Itchy area on left side of neck for the last 8 weeks.   Neurological:  Positive for headaches. Negative for dizziness, weakness and light-headedness.  Psychiatric/Behavioral:  Negative for dysphoric mood. The patient is not nervous/anxious.     Current Outpatient Medications on File Prior to Visit  Medication Sig Dispense Refill   Biotin 10 MG CAPS Take by mouth.     calcium-vitamin D (OSCAL WITH D) 500-200 MG-UNIT TABS  tablet Take by mouth.     clotrimazole-betamethasone (LOTRISONE) cream Apply topically.     cyanocobalamin 1000 MCG tablet Take by mouth.     dicyclomine (BENTYL) 10 MG capsule Take 1 capsule (10 mg total) by mouth 3 (three) times daily before meals. As needed 90 capsule 8   ketoconazole (NIZORAL) 2 % cream Apply to lower abdominal rash 15 g 0   lisinopril (ZESTRIL) 5 MG tablet Take 1 tablet by mouth daily.     magnesium hydroxide (MILK OF MAGNESIA) 400 MG/5ML suspension Take 15 mLs by mouth daily as needed for mild constipation.     metoCLOPramide (REGLAN) 5 MG tablet Take 1 tablet (5 mg total) by mouth every 8 (eight) hours as needed for nausea or vomiting. 60 tablet 1   ondansetron (ZOFRAN) 4 MG tablet Take 4 mg by mouth every 8 (eight) hours as needed for nausea or vomiting.     pyridoxine (B-6) 100 MG tablet Take by mouth.     Na Sulfate-K Sulfate-Mg Sulf  17.5-3.13-1.6 GM/177ML SOLN See admin instructions.     Vitamin D, Ergocalciferol, (DRISDOL) 1.25 MG (50000 UNIT) CAPS capsule Take 1 capsule (50,000 Units total) by mouth every 7 (seven) days. (Patient not taking: Reported on 01/22/2023) 12 capsule 0   No current facility-administered medications on file prior to visit.   Past Medical History:  Diagnosis Date   Allergy    Anemia    Anxiety    Aortic atherosclerosis (HCC)    Arthritis    Cataract    Depression    Family history of colon cancer    Fibromyalgia    GERD (gastroesophageal reflux disease)    Hyperlipidemia    IBS (irritable bowel syndrome)    Moderate recurrent major depression (HCC) 08/05/2021   Rectal prolapse    Stomach ulcer    Tick bite    took antibiotics memorial day weekend 2021 on back   Past Surgical History:  Procedure Laterality Date   APPENDECTOMY     CESAREAN SECTION     CHOLECYSTECTOMY     COLONOSCOPY  04/18/2014   Diverticulosis.    ESOPHAGOGASTRODUODENOSCOPY  04/14/2016   Schatzkis ring. Hiatal hernia. Gastritis.    HERNIA REPAIR  11/07/2021   Hiatal   LIPOMA EXCISION     NISSEN FUNDOPLICATION  11/07/2021   [facility]   thermal ablation     TONSILLECTOMY      Family History  Problem Relation Age of Onset   Colon cancer Mother    Liver cancer Father    CAD Father    Hypertension Sister    Colon cancer Sister    Hyperlipidemia Brother    Gout Brother    Diabetes Maternal Grandmother    Diabetes Paternal Grandmother    Heart attack Other    Cancer Other        Leiomyosarcome and Liver   Migraines Other    Cancer Maternal Aunt        "female cancer, not breast"   Cancer Paternal Aunt        "female cancer, not breast"   Colon polyps Neg Hx    Esophageal cancer Neg Hx    Pancreatic cancer Neg Hx    Rectal cancer Neg Hx    Stomach cancer Neg Hx    Social History   Socioeconomic History   Marital status: Widowed    Spouse name: Not on file    Number of children: 2  Years of education: Not on file   Highest education level: Not on file  Occupational History   Not on file  Tobacco Use   Smoking status: Never   Smokeless tobacco: Never  Vaping Use   Vaping Use: Never used  Substance and Sexual Activity   Alcohol use: Never   Drug use: Never   Sexual activity: Not on file  Other Topics Concern   Not on file  Social History Narrative   Not on file   Social Determinants of Health   Financial Resource Strain: Not on file  Food Insecurity: No Food Insecurity (11/08/2020)   Hunger Vital Sign    Worried About Running Out of Food in the Last Year: Never true    Ran Out of Food in the Last Year: Never true  Transportation Needs: No Transportation Needs (11/08/2020)   PRAPARE - Administrator, Civil Service (Medical): No    Lack of Transportation (Non-Medical): No  Physical Activity: Not on file  Stress: Not on file  Social Connections: Not on file    Objective:  BP 134/80   Pulse 60   Temp 98.3 F (36.8 C)   Resp 14   Ht 5' 6.5" (1.689 m)   Wt 206 lb (93.4 kg)   BMI 32.75 kg/m      01/28/2023    2:22 PM 11/23/2022    1:42 PM 09/27/2022   10:39 AM  BP/Weight  Systolic BP 134 138 138  Diastolic BP 80 82 82  Wt. (Lbs) 206 206 209  BMI 32.75 kg/m2 32.75 kg/m2 33.23 kg/m2    Physical Exam Vitals reviewed.  Constitutional:      Appearance: Normal appearance. She is normal weight.  Neck:     Vascular: No carotid bruit.  Cardiovascular:     Rate and Rhythm: Normal rate and regular rhythm.     Heart sounds: Normal heart sounds.  Pulmonary:     Effort: Pulmonary effort is normal. No respiratory distress.     Breath sounds: Normal breath sounds.  Abdominal:     General: Abdomen is flat. Bowel sounds are normal.     Palpations: Abdomen is soft.     Tenderness: There is no abdominal tenderness.  Skin:    General: Skin is warm and dry.     Findings: Erythema (left side of neck. no rash. looks more  like from scratching.) present.  Neurological:     Mental Status: She is alert and oriented to person, place, and time.  Psychiatric:        Mood and Affect: Mood normal.        Behavior: Behavior normal.     Diabetic Foot Exam - Simple   No data filed      Lab Results  Component Value Date   WBC 4.8 01/22/2023   HGB 14.7 01/22/2023   HCT 44.5 01/22/2023   PLT 204 01/22/2023   GLUCOSE 89 01/22/2023   CHOL 221 (H) 01/22/2023   TRIG 131 01/22/2023   HDL 61 01/22/2023   LDLCALC 137 (H) 01/22/2023   ALT 17 01/22/2023   AST 24 01/22/2023   NA 140 01/22/2023   K 4.1 01/22/2023   CL 102 01/22/2023   CREATININE 0.73 01/22/2023   BUN 12 01/22/2023   CO2 21 01/22/2023   TSH 2.910 08/05/2021   HGBA1C 5.8 (H) 01/22/2023      Assessment & Plan:    Mixed hyperlipidemia Assessment & Plan: Well controlled.  No medicines.  Continue to work on eating a healthy diet and exercise.  Labs drawn today.    Orders: -     Lipid panel -     Comprehensive metabolic panel -     CBC with Differential/Platelet -     Cardiovascular Risk Assessment  Prediabetes Assessment & Plan: Hemoglobin A1c 6.0%, 3 month avg of blood sugars, is in prediabetic range.  In order to prevent progression to diabetes, recommend low carb diet and regular exercise   Orders: -     Hemoglobin A1c  Gastroesophageal reflux disease without esophagitis Assessment & Plan: The current medical regimen is effective;  continue present plan and medications.    Aortic atherosclerosis (HCC)  Myalgia due to statin Assessment & Plan: Intolerant to statins   Essential hypertension, benign Assessment & Plan: Continue lisinopril 5 mg daily.    Pruritus Assessment & Plan: No clear source.  No rash.  Start on hydroxyzine 25 mg three times a day as needed.  Orders: -     hydrOXYzine Pamoate; Take 1 capsule (25 mg total) by mouth every 8 (eight) hours as needed.  Dispense: 90 capsule; Refill: 0     Meds  ordered this encounter  Medications   hydrOXYzine (VISTARIL) 25 MG capsule    Sig: Take 1 capsule (25 mg total) by mouth every 8 (eight) hours as needed.    Dispense:  90 capsule    Refill:  0    Orders Placed This Encounter  Procedures   Lipid panel   Comprehensive metabolic panel   CBC with Differential/Platelet   Hemoglobin A1c   Cardiovascular Risk Assessment     Follow-up: No follow-ups on file.   I,Carolyn M Morrison,acting as a Neurosurgeon for Blane Ohara, MD.,have documented all relevant documentation on the behalf of Blane Ohara, MD,as directed by  Blane Ohara, MD while in the presence of Blane Ohara, MD.   Clayborn Bigness I Leal-Borjas,acting as a scribe for Blane Ohara, MD.,have documented all relevant documentation on the behalf of Blane Ohara, MD,as directed by  Blane Ohara, MD while in the presence of Blane Ohara, MD.    An After Visit Summary was printed and given to the patient.  Blane Ohara, MD [facility] [phone]

## 2023-01-23 LAB — LIPID PANEL
Chol/HDL Ratio: 3.6 ratio (ref 0.0–4.4)
Cholesterol, Total: 221 mg/dL — ABNORMAL HIGH (ref 100–199)
HDL: 61 mg/dL (ref >39)
LDL Chol Calc (NIH): 137 mg/dL — ABNORMAL HIGH (ref 0–99)
Triglycerides: 131 mg/dL (ref 0–149)
VLDL Cholesterol Cal: 23 mg/dL (ref 5–40)

## 2023-01-23 LAB — COMPREHENSIVE METABOLIC PANEL
ALT: 17 IU/L (ref 0–32)
AST: 24 IU/L (ref 0–40)
Albumin/Globulin Ratio: 1.8 (ref 1.2–2.2)
Albumin: 4.2 g/dL (ref 3.8–4.8)
Alkaline Phosphatase: 109 IU/L (ref 44–121)
BUN/Creatinine Ratio: 16 (ref 12–28)
BUN: 12 mg/dL (ref 8–27)
Bilirubin Total: 0.6 mg/dL (ref 0.0–1.2)
CO2: 21 mmol/L (ref 20–29)
Calcium: 9.3 mg/dL (ref 8.7–10.3)
Chloride: 102 mmol/L (ref 96–106)
Creatinine, Ser: 0.73 mg/dL (ref 0.57–1.00)
Globulin, Total: 2.4 g/dL (ref 1.5–4.5)
Glucose: 89 mg/dL (ref 70–99)
Potassium: 4.1 mmol/L (ref 3.5–5.2)
Sodium: 140 mmol/L (ref 134–144)
Total Protein: 6.6 g/dL (ref 6.0–8.5)
eGFR: 87 mL/min/{1.73_m2} (ref >59)

## 2023-01-23 LAB — CBC WITH DIFFERENTIAL/PLATELET
Basophils Absolute: 0 10*3/uL (ref 0.0–0.2)
Basos: 1 %
EOS (ABSOLUTE): 0.1 10*3/uL (ref 0.0–0.4)
Eos: 3 %
Hematocrit: 44.5 % (ref 34.0–46.6)
Hemoglobin: 14.7 g/dL (ref 11.1–15.9)
Immature Grans (Abs): 0 10*3/uL (ref 0.0–0.1)
Immature Granulocytes: 0 %
Lymphocytes Absolute: 1.3 10*3/uL (ref 0.7–3.1)
Lymphs: 28 %
MCH: 29.6 pg (ref 26.6–33.0)
MCHC: 33 g/dL (ref 31.5–35.7)
MCV: 90 fL (ref 79–97)
Monocytes Absolute: 0.3 10*3/uL (ref 0.1–0.9)
Monocytes: 7 %
Neutrophils Absolute: 3 10*3/uL (ref 1.4–7.0)
Neutrophils: 61 %
Platelets: 204 10*3/uL (ref 150–450)
RBC: 4.97 x10E6/uL (ref 3.77–5.28)
RDW: 12.8 % (ref 11.7–15.4)
WBC: 4.8 10*3/uL (ref 3.4–10.8)

## 2023-01-23 LAB — HEMOGLOBIN A1C
Est. average glucose Bld gHb Est-mCnc: 120 mg/dL
Hgb A1c MFr Bld: 5.8 % — ABNORMAL HIGH (ref 4.8–5.6)

## 2023-01-23 LAB — CARDIOVASCULAR RISK ASSESSMENT

## 2023-01-27 NOTE — Assessment & Plan Note (Signed)
The current medical regimen is effective;  continue present plan and medications.  

## 2023-01-27 NOTE — Assessment & Plan Note (Signed)
Hemoglobin A1c 6.0%, 3 month avg of blood sugars, is in prediabetic range.  In order to prevent progression to diabetes, recommend low carb diet and regular exercise 

## 2023-01-27 NOTE — Assessment & Plan Note (Signed)
Well controlled.  No medicines.  Continue to work on eating a healthy diet and exercise.  Labs drawn today.   

## 2023-01-28 ENCOUNTER — Encounter: Payer: Self-pay | Admitting: Family Medicine

## 2023-01-28 DIAGNOSIS — I10 Essential (primary) hypertension: Secondary | ICD-10-CM | POA: Insufficient documentation

## 2023-01-28 DIAGNOSIS — L299 Pruritus, unspecified: Secondary | ICD-10-CM | POA: Insufficient documentation

## 2023-01-28 NOTE — Assessment & Plan Note (Signed)
No clear source.  No rash.  Start on hydroxyzine 25 mg three times a day as needed.

## 2023-01-28 NOTE — Assessment & Plan Note (Signed)
Intolerant to statins. 

## 2023-01-28 NOTE — Assessment & Plan Note (Signed)
Continue lisinopril 5mg daily

## 2023-02-13 ENCOUNTER — Other Ambulatory Visit: Payer: Self-pay | Admitting: Family Medicine

## 2023-02-13 DIAGNOSIS — L299 Pruritus, unspecified: Secondary | ICD-10-CM

## 2023-02-13 NOTE — Telephone Encounter (Signed)
Unsure if you want her to have 90 days

## 2023-02-15 ENCOUNTER — Telehealth: Payer: Self-pay

## 2023-02-15 NOTE — Progress Notes (Cosign Needed Addendum)
Care Management & Coordination Services Pharmacy Team  Reason for Encounter: General adherence update   Contacted patient for general health update and medication adherence call.  Spoke with patient on 02/20/2023    What concerns do you have about your medications? Denies any concerns   The patient denies side effects with their medications.   How often do you forget or accidentally miss a dose? Never  Do you use a pillbox? No  Are you having any problems getting your medications from your pharmacy? No  Has the cost of your medications been a concern? No  The patient has had an ED visit since last contact. On 11/05/22 at [facility] for a headache  The patient reports the following problems with their health. Pt had a tooth extraction back in December and a thyroid biopsy in March and since then pt has experienced itching on the outside of her neck and some ear pain on the right side. Pt feels her neck feels weird and feels like something is in there. She feels choky in her throat since these procedures, like a lump. She was put on hydroxyzine but it made her feel sleepy all the time and did not help her much. She denies any trouble swallowing and eating.   Patient denies concerns or questions for Artelia Laroche, PharmD at this time.   Counseled patient on: Great job taking medications   Chart Updates:  Recent office visits:  01/22/23 Blane Ohara MD. Seen for follow up. Started on Hydroxyzine Pamoate 25mg  Q8H. D/C Eliquis 5mg .   11/23/22 Cox, Kirsten MD. See for follow up. Stop eliquis.Start baby aspirin 81 mg daily. Start omeprazole 40 mg once daily.   10/03/22 Blane Ohara MD. Orders Only. Ordered Vitamin D 1.25mg  every 7 days.   09/27/22 Blane Ohara MD. Seen for follow up. D/C Prucalopride Succinate (MOTEGRITY) 2 MG.   Recent consult visits:  01/03/23 Berneice Gandy RN. (Cardiology) Orders Only. Ordered Lisinopril 5mg  daily.   12/18/22 Hensel, Osborne Casco OD. (Optical). Seen  for follow up. No med changes.   10/26/22 Letta Moynahan, NP (Cardiology) Seen for follow up No med changes.   10/17/22 Letta Moynahan, NP. (Cardiology) Seen for follow up No med changes.   09/28/22 Letta Moynahan, NP. (Cardiology) Seen for initial visit.  No med changes.   Hospital visits:  (Media) 11/05/22 Admitted on 11/05/22 at [facility] for a headache.  Ordered Levaquin 750mg  and Eliquis 10mg    All other meds remained the same   Medications: Outpatient Encounter Medications as of 02/15/2023  Medication Sig   Biotin 10 MG CAPS Take by mouth.   calcium-vitamin D (OSCAL WITH D) 500-200 MG-UNIT TABS tablet Take by mouth.   clotrimazole-betamethasone (LOTRISONE) cream Apply topically.   cyanocobalamin 1000 MCG tablet Take by mouth.   dicyclomine (BENTYL) 10 MG capsule Take 1 capsule (10 mg total) by mouth 3 (three) times daily before meals. As needed   hydrOXYzine (VISTARIL) 25 MG capsule TAKE 1 CAPSULE (25 MG TOTAL) BY MOUTH EVERY 8 (EIGHT) HOURS AS NEEDED.   ketoconazole (NIZORAL) 2 % cream Apply to lower abdominal rash   lisinopril (ZESTRIL) 5 MG tablet Take 1 tablet by mouth daily.   magnesium hydroxide (MILK OF MAGNESIA) 400 MG/5ML suspension Take 15 mLs by mouth daily as needed for mild constipation.   metoCLOPramide (REGLAN) 5 MG tablet Take 1 tablet (5 mg total) by mouth every 8 (eight) hours as needed for nausea or vomiting.   Na Sulfate-K Sulfate-Mg  Sulf 17.5-3.13-1.6 GM/177ML SOLN See admin instructions.   ondansetron (ZOFRAN) 4 MG tablet Take 4 mg by mouth every 8 (eight) hours as needed for nausea or vomiting.   pyridoxine (B-6) 100 MG tablet Take by mouth.   Vitamin D, Ergocalciferol, (DRISDOL) 1.25 MG (50000 UNIT) CAPS capsule Take 1 capsule (50,000 Units total) by mouth every 7 (seven) days. (Patient not taking: Reported on 01/22/2023)   No facility-administered encounter medications on file as of 02/15/2023.    Recent vitals BP  Readings from Last 3 Encounters:  01/28/23 134/80  11/23/22 138/82  09/27/22 138/82   Pulse Readings from Last 3 Encounters:  01/28/23 60  11/23/22 60  09/27/22 (!) 59   Wt Readings from Last 3 Encounters:  01/28/23 206 lb (93.4 kg)  11/23/22 206 lb (93.4 kg)  09/27/22 209 lb (94.8 kg)   BMI Readings from Last 3 Encounters:  01/28/23 32.75 kg/m  11/23/22 32.75 kg/m  09/27/22 33.23 kg/m    Recent lab results    Component Value Date/Time   NA 140 01/22/2023 1440   K 4.1 01/22/2023 1440   CL 102 01/22/2023 1440   CO2 21 01/22/2023 1440   GLUCOSE 89 01/22/2023 1440   GLUCOSE 98 01/19/2020 1232   BUN 12 01/22/2023 1440   CREATININE 0.73 01/22/2023 1440   CALCIUM 9.3 01/22/2023 1440    Lab Results  Component Value Date   CREATININE 0.73 01/22/2023   GFR 65.95 01/19/2020   EGFR 87 01/22/2023   GFRNONAA 68 11/11/2020   GFRAA 79 11/11/2020   Lab Results  Component Value Date/Time   HGBA1C 5.8 (H) 01/22/2023 02:40 PM   HGBA1C 6.0 (H) 09/27/2022 11:17 AM    Lab Results  Component Value Date   CHOL 221 (H) 01/22/2023   HDL 61 01/22/2023   LDLCALC 137 (H) 01/22/2023   TRIG 131 01/22/2023   CHOLHDL 3.6 01/22/2023    Care Gaps: Annual wellness visit in last year? No, overdue since 01/19/22 Mammogram: Overdue since 01/11/23 (Advised pt to contact to schedule these appts and pt was aware she nees to get this scheduled and will follow up on these care gaps)  If Diabetic:N/A Last eye exam / retinopathy screening: Last diabetic foot exam: Last UACR:   Star Rating Drugs:  Medication:  Last Fill: Day Supply Lisinopril   01/03/23 90ds  (Pt is not taking this, she does not feel she needs this with having good BP readings)   Roxana Hires, CMA Clinical Pharmacist Assistant  [phone]

## 2023-02-21 ENCOUNTER — Other Ambulatory Visit: Payer: Self-pay | Admitting: Family Medicine

## 2023-02-21 MED ORDER — ESTROGENS CONJUGATED 0.625 MG/GM VA CREA: TOPICAL_CREAM | VAGINAL | 12 refills | Status: DC

## 2023-02-21 NOTE — Telephone Encounter (Signed)
Vaginal premarin cream applicator once daily x 2 weeks, Than go to twice weekly. Dr. Sedalia Muta

## 2023-02-22 ENCOUNTER — Other Ambulatory Visit: Payer: Self-pay

## 2023-02-22 NOTE — Telephone Encounter (Signed)
Called patient to let her know to pick up med, left VM on [phone].  Other line VM box was full

## 2023-02-23 NOTE — Telephone Encounter (Signed)
Sent estrace. Dr. Sedalia Muta

## 2023-03-02 DIAGNOSIS — R92311 Mammographic fatty tissue density, right breast: Secondary | ICD-10-CM | POA: Diagnosis not present

## 2023-03-02 DIAGNOSIS — N6489 Other specified disorders of breast: Secondary | ICD-10-CM | POA: Diagnosis not present

## 2023-03-02 DIAGNOSIS — R92313 Mammographic fatty tissue density, bilateral breasts: Secondary | ICD-10-CM | POA: Diagnosis not present

## 2023-03-02 LAB — HM MAMMOGRAPHY

## 2023-03-05 ENCOUNTER — Encounter: Payer: Self-pay | Admitting: Family Medicine

## 2023-03-19 ENCOUNTER — Ambulatory Visit (INDEPENDENT_AMBULATORY_CARE_PROVIDER_SITE_OTHER): Payer: PPO | Admitting: Physician Assistant

## 2023-03-19 ENCOUNTER — Encounter: Payer: Self-pay | Admitting: Physician Assistant

## 2023-03-19 VITALS — BP 122/68 | HR 78 | Temp 97.8°F | Ht 66.5 in | Wt 204.0 lb

## 2023-03-19 DIAGNOSIS — R599 Enlarged lymph nodes, unspecified: Secondary | ICD-10-CM | POA: Insufficient documentation

## 2023-03-19 DIAGNOSIS — R21 Rash and other nonspecific skin eruption: Secondary | ICD-10-CM | POA: Diagnosis not present

## 2023-03-19 NOTE — Assessment & Plan Note (Addendum)
Encouraged patient to continue using her ketoconazole and powder for the rash in her left iliac region Sent dermatology referral to look at the rash

## 2023-03-19 NOTE — Assessment & Plan Note (Signed)
Will continue to monitor for any color changes or changes in size. Will wait for dermatology referral.

## 2023-03-19 NOTE — Progress Notes (Signed)
Acute Office Visit  Subjective:    Patient ID: Patricia Leonard, female    DOB: 1951/03/26, 72 y.o.   MRN: [id number]  Chief Complaint  Patient presents with   Mass    Posterior neck    HPI: Patient is in today for lump on posterior neck for several months. Painless. Unsure if size increases, seems that she feels it more at certain times than others. Does not itch however left side of next has been itchy for several months. Has not seen Dermatology. Has had a precancerous lesion removed in the past. States she did have a really bad upper respiratory infection a few months ago that caused her to have a cough that lasted for months. Denies any fevers, or a rash coming up with the lump on her neck. States that when she feels the lump it causes an aching pain to occur. States the lump has fluctuated in size but was unable to say if it changed after the URI she had.   Patient also has a rash on her left iliac region. States she did see a local dermatologist for it and that they had prescriber her ketoconazole creme along with a drying powder to help try and clear up the rash. She states it helped a little but not much. She still currently has the rash there and this has been a few weeks since she has tried the medication along with the powder. Admits to it being pruritic and also has a family history of eczema and a history herself of seasonal allergies.   Past Medical History:  Diagnosis Date   Allergy    Anemia    Anxiety    Aortic atherosclerosis (HCC)    Arthritis    Cataract    Depression    Family history of colon cancer    Fibromyalgia    GERD (gastroesophageal reflux disease)    Hyperlipidemia    IBS (irritable bowel syndrome)    Moderate recurrent major depression (HCC) 08/05/2021   Rectal prolapse    Stomach ulcer    Tick bite    took antibiotics memorial day weekend 2021 on back    Past Surgical History:  Procedure Laterality Date   APPENDECTOMY     CESAREAN SECTION      CHOLECYSTECTOMY     COLONOSCOPY  04/18/2014   Diverticulosis.    ESOPHAGOGASTRODUODENOSCOPY  04/14/2016   Schatzkis ring. Hiatal hernia. Gastritis.    HERNIA REPAIR  11/07/2021   Hiatal   LIPOMA EXCISION     NISSEN FUNDOPLICATION  11/07/2021   [facility]   thermal ablation     TONSILLECTOMY      Family History  Problem Relation Age of Onset   Colon cancer Mother    Liver cancer Father    CAD Father    Hypertension Sister    Colon cancer Sister    Hyperlipidemia Brother    Gout Brother    Diabetes Maternal Grandmother    Diabetes Paternal Grandmother    Heart attack Other    Cancer Other        Leiomyosarcome and Liver   Migraines Other    Cancer Maternal Aunt        "female cancer, not breast"   Cancer Paternal Aunt        "female cancer, not breast"   Colon polyps Neg Hx    Esophageal cancer Neg Hx    Pancreatic cancer Neg Hx  Rectal cancer Neg Hx    Stomach cancer Neg Hx     Social History   Socioeconomic History   Marital status: Widowed    Spouse name: Not on file   Number of children: 2   Years of education: Not on file   Highest education level: Not on file  Occupational History   Not on file  Tobacco Use   Smoking status: Never   Smokeless tobacco: Never  Vaping Use   Vaping Use: Never used  Substance and Sexual Activity   Alcohol use: Never   Drug use: Never   Sexual activity: Not on file  Other Topics Concern   Not on file  Social History Narrative   Not on file   Social Determinants of Health   Financial Resource Strain: Not on file  Food Insecurity: No Food Insecurity (11/08/2020)   Hunger Vital Sign    Worried About Running Out of Food in the Last Year: Never true    Ran Out of Food in the Last Year: Never true  Transportation Needs: No Transportation Needs (11/08/2020)   PRAPARE - Administrator, Civil Service (Medical): No    Lack of Transportation (Non-Medical): No  Physical  Activity: Not on file  Stress: Not on file  Social Connections: Not on file  Intimate Partner Violence: Not on file    Outpatient Medications Prior to Visit  Medication Sig Dispense Refill   Biotin 10 MG CAPS Take by mouth.     calcium-vitamin D (OSCAL WITH D) 500-200 MG-UNIT TABS tablet Take by mouth.     clotrimazole-betamethasone (LOTRISONE) cream Apply topically.     cyanocobalamin 1000 MCG tablet Take by mouth.     dicyclomine (BENTYL) 10 MG capsule Take 1 capsule (10 mg total) by mouth 3 (three) times daily before meals. As needed 90 capsule 8   estradiol (ESTRACE) 0.1 MG/GM vaginal cream Place 1 Applicatorful vaginally daily for 14 days, THEN 1 Applicatorful 2 (two) times a week for 14 days. 42.5 g 0   hydrOXYzine (VISTARIL) 25 MG capsule TAKE 1 CAPSULE (25 MG TOTAL) BY MOUTH EVERY 8 (EIGHT) HOURS AS NEEDED. 270 capsule 1   ketoconazole (NIZORAL) 2 % cream Apply to lower abdominal rash 15 g 0   lisinopril (ZESTRIL) 5 MG tablet Take 1 tablet by mouth daily.     magnesium hydroxide (MILK OF MAGNESIA) 400 MG/5ML suspension Take 15 mLs by mouth daily as needed for mild constipation.     metoCLOPramide (REGLAN) 5 MG tablet Take 1 tablet (5 mg total) by mouth every 8 (eight) hours as needed for nausea or vomiting. 60 tablet 1   ondansetron (ZOFRAN) 4 MG tablet Take 4 mg by mouth every 8 (eight) hours as needed for nausea or vomiting.     pyridoxine (B-6) 100 MG tablet Take by mouth.     Na Sulfate-K Sulfate-Mg Sulf 17.5-3.13-1.6 GM/177ML SOLN See admin instructions.     Vitamin D, Ergocalciferol, (DRISDOL) 1.25 MG (50000 UNIT) CAPS capsule Take 1 capsule (50,000 Units total) by mouth every 7 (seven) days. (Patient not taking: Reported on 01/22/2023) 12 capsule 0   No facility-administered medications prior to visit.    Allergies  Allergen Reactions   Statins     Muscle pain   Amoxicillin-Pot Clavulanate Nausea Only    Stomach pain    Gadolinium Derivatives Rash   Iodinated Contrast  Media Other (See Comments) and Rash     Desc: HAD FACIAL RASH WITH CT CONTRAST  IN 2006 HAD FACIAL RASH WITH CT CONTRAST IN 2006  Desc: HAD FACIAL RASH WITH CT CONTRAST IN 2006    Iohexol Rash    HAD FACIAL RASH WITH CT CONTRAST IN 2006     Review of Systems  Constitutional:  Negative for chills, fatigue and fever.  HENT:  Negative for congestion, ear pain, rhinorrhea and sore throat.   Respiratory:  Negative for cough and shortness of breath.   Cardiovascular:  Negative for chest pain.  Gastrointestinal:  Negative for abdominal pain, constipation, diarrhea, nausea and vomiting.  Genitourinary:  Negative for dysuria and urgency.  Musculoskeletal:  Negative for back pain and myalgias.  Neurological:  Negative for dizziness, weakness, light-headedness and headaches.  Psychiatric/Behavioral:  Negative for dysphoric mood. The patient is not nervous/anxious.        Objective:        03/19/2023    8:46 AM 01/28/2023    2:22 PM 11/23/2022    1:42 PM  Vitals with BMI  Height 5' 6.5" 5' 6.5" 5' 6.5"  Weight 204 lbs 206 lbs 206 lbs  BMI 32.44 32.76 32.76  Systolic 122 134 161  Diastolic 68 80 82  Pulse 78 60 60    No data found.   Physical Exam Vitals reviewed.  Constitutional:      Appearance: Normal appearance. She is normal weight.  Cardiovascular:     Rate and Rhythm: Normal rate and regular rhythm.     Heart sounds: Normal heart sounds.  Pulmonary:     Effort: Pulmonary effort is normal. No respiratory distress.     Breath sounds: Normal breath sounds.  Abdominal:     General: Abdomen is flat. Bowel sounds are normal.     Palpations: Abdomen is soft.     Tenderness: There is no abdominal tenderness.  Skin:    General: Skin is warm and moist.     Findings: Abrasion and rash present. Rash is macular and purpuric. Rash is not pustular or scaling.          Comments: Left iliac is rash  Posterior neck is the lump  Neurological:     Mental Status: She is alert and  oriented to person, place, and time.  Psychiatric:        Mood and Affect: Mood normal.        Behavior: Behavior normal.     Health Maintenance Due  Topic Date Due   Hepatitis C Screening  Never done   DTaP/Tdap/Td (1 - Tdap) Never done   Zoster Vaccines- Shingrix (1 of 2) Never done   Pneumonia Vaccine 54+ Years old (1 of 1 - PCV) Never done   Medicare Annual Wellness (AWV)  01/19/2022   Colonoscopy  04/04/2023    There are no preventive care reminders to display for this patient.   Lab Results  Component Value Date   TSH 2.910 08/05/2021   Lab Results  Component Value Date   WBC 4.8 01/22/2023   HGB 14.7 01/22/2023   HCT 44.5 01/22/2023   MCV 90 01/22/2023   PLT 204 01/22/2023   Lab Results  Component Value Date   NA 140 01/22/2023   K 4.1 01/22/2023   CO2 21 01/22/2023   GLUCOSE 89 01/22/2023   BUN 12 01/22/2023   CREATININE 0.73 01/22/2023   BILITOT 0.6 01/22/2023   ALKPHOS 109 01/22/2023   AST 24 01/22/2023   ALT 17 01/22/2023   PROT 6.6 01/22/2023   ALBUMIN 4.2 01/22/2023   CALCIUM  9.3 01/22/2023   EGFR 87 01/22/2023   GFR 65.95 01/19/2020   Lab Results  Component Value Date   CHOL 221 (H) 01/22/2023   Lab Results  Component Value Date   HDL 61 01/22/2023   Lab Results  Component Value Date   LDLCALC 137 (H) 01/22/2023   Lab Results  Component Value Date   TRIG 131 01/22/2023   Lab Results  Component Value Date   CHOLHDL 3.6 01/22/2023   Lab Results  Component Value Date   HGBA1C 5.8 (H) 01/22/2023       Assessment & Plan:  Localized macular rash Assessment & Plan: Encouraged patient to continue using her ketoconazole and powder for the rash in her left iliac region Sent dermatology referral to look at the rash   Orders: -     Ambulatory referral to Dermatology  Swollen lymph nodes Assessment & Plan: Will continue to monitor for any color changes or changes in size. Will wait for dermatology referral.       No orders  of the defined types were placed in this encounter.   Orders Placed This Encounter  Procedures   Ambulatory referral to Dermatology     Follow-up: Return if symptoms worsen or fail to improve.  An After Visit Summary was printed and given to the patient.  Langley Gauss, [facility] [phone]

## 2023-03-20 DIAGNOSIS — Z6831 Body mass index (BMI) 31.0-31.9, adult: Secondary | ICD-10-CM | POA: Diagnosis not present

## 2023-03-20 DIAGNOSIS — E6609 Other obesity due to excess calories: Secondary | ICD-10-CM | POA: Insufficient documentation

## 2023-03-20 DIAGNOSIS — I7121 Aneurysm of the ascending aorta, without rupture: Secondary | ICD-10-CM | POA: Diagnosis not present

## 2023-03-20 DIAGNOSIS — G4733 Obstructive sleep apnea (adult) (pediatric): Secondary | ICD-10-CM | POA: Diagnosis not present

## 2023-03-20 DIAGNOSIS — R002 Palpitations: Secondary | ICD-10-CM | POA: Diagnosis not present

## 2023-03-20 DIAGNOSIS — E785 Hyperlipidemia, unspecified: Secondary | ICD-10-CM | POA: Diagnosis not present

## 2023-03-20 DIAGNOSIS — I358 Other nonrheumatic aortic valve disorders: Secondary | ICD-10-CM | POA: Diagnosis not present

## 2023-04-11 ENCOUNTER — Ambulatory Visit: Payer: PPO

## 2023-04-11 VITALS — BP 108/60 | HR 61 | Resp 16 | Ht 66.5 in | Wt 204.0 lb

## 2023-04-11 DIAGNOSIS — K3184 Gastroparesis: Secondary | ICD-10-CM | POA: Diagnosis not present

## 2023-04-11 DIAGNOSIS — Z0000 Encounter for general adult medical examination without abnormal findings: Secondary | ICD-10-CM

## 2023-04-11 NOTE — Patient Instructions (Addendum)
Patricia Leonard , Thank you for taking time to come for your Medicare Wellness Visit. I appreciate your ongoing commitment to your health goals. Please review the following plan we discussed and let me know if I can assist you in the future.   This is a list of the screening recommended for you and due dates:  Health Maintenance  Topic Date Due   Hepatitis C Screening  Never done   DTaP/Tdap/Td vaccine (1 - Tdap) Never done   Zoster (Shingles) Vaccine (1 of 2) Never done   Pneumonia Vaccine (2 of 2 - PCV) 10/12/2019   Colon Cancer Screening  04/04/2023   Flu Shot  04/26/2023   Medicare Annual Wellness Visit  04/10/2024   Mammogram  03/01/2025   DEXA scan (bone density measurement)  Completed   HPV Vaccine  Aged Out   COVID-19 Vaccine  Discontinued    Advanced directives: Please bring a copy for your medical record  Referral sent for nutrition education at [facility]   Preventive Care 65 Years and Older, Female Preventive care refers to lifestyle choices and visits with your health care provider that can promote health and wellness. What does preventive care include? A yearly physical exam. This is also called an annual well check. Dental exams once or twice a year. Routine eye exams. Ask your health care provider how often you should have your eyes checked. Personal lifestyle choices, including: Daily care of your teeth and gums. Regular physical activity. Eating a healthy diet. Avoiding tobacco and drug use. Limiting alcohol use. Practicing safe sex. Taking low-dose aspirin every day. Taking vitamin and mineral supplements as recommended by your health care provider. What happens during an annual well check? The services and screenings done by your health care provider during your annual well check will depend on your age, overall health, lifestyle risk factors, and family history of disease. Counseling  Your health care provider may ask you questions about your: Alcohol  use. Tobacco use. Drug use. Emotional well-being. Home and relationship well-being. Sexual activity. Eating habits. History of falls. Memory and ability to understand (cognition). Work and work Astronomer. Reproductive health. Screening  You may have the following tests or measurements: Height, weight, and BMI. Blood pressure. Lipid and cholesterol levels. These may be checked every 5 years, or more frequently if you are over 74 years old. Skin check. Lung cancer screening. You may have this screening every year starting at age 74 if you have a 30-pack-year history of smoking and currently smoke or have quit within the past 15 years. Fecal occult blood test (FOBT) of the stool. You may have this test every year starting at age 74. Flexible sigmoidoscopy or colonoscopy. You may have a sigmoidoscopy every 5 years or a colonoscopy every 10 years starting at age 74. Hepatitis C blood test. Hepatitis B blood test. Sexually transmitted disease (STD) testing. Diabetes screening. This is done by checking your blood sugar (glucose) after you have not eaten for a while (fasting). You may have this done every 1-3 years. Bone density scan. This is done to screen for osteoporosis. You may have this done starting at age 74. Mammogram. This may be done every 1-2 years. Talk to your health care provider about how often you should have regular mammograms. Talk with your health care provider about your test results, treatment options, and if necessary, the need for more tests. Vaccines  Your health care provider may recommend certain vaccines, such as: Influenza vaccine. This is recommended every  year. Tetanus, diphtheria, and acellular pertussis (Tdap, Td) vaccine. You may need a Td booster every 10 years. Zoster vaccine. You may need this after age 74. Pneumococcal 13-valent conjugate (PCV13) vaccine. One dose is recommended after age 74. Pneumococcal polysaccharide (PPSV23) vaccine. One dose is  recommended after age 74. Talk to your health care provider about which screenings and vaccines you need and how often you need them. This information is not intended to replace advice given to you by your health care provider. Make sure you discuss any questions you have with your health care provider. Document Released: 10/08/2015 Document Revised: 05/31/2016 Document Reviewed: 07/13/2015 Elsevier Interactive Patient Education  2017 [employer].  Fall Prevention in the Home Falls can cause injuries. They can happen to people of all ages. There are many things you can do to make your home safe and to help prevent falls. What can I do on the outside of my home? Regularly fix the edges of walkways and driveways and fix any cracks. Remove anything that might make you trip as you walk through a door, such as a raised step or threshold. Trim any bushes or trees on the path to your home. Use bright outdoor lighting. Clear any walking paths of anything that might make someone trip, such as rocks or tools. Regularly check to see if handrails are loose or broken. Make sure that both sides of any steps have handrails. Any raised decks and porches should have guardrails on the edges. Have any leaves, snow, or ice cleared regularly. Use sand or salt on walking paths during winter. Clean up any spills in your garage right away. This includes oil or grease spills. What can I do in the bathroom? Use night lights. Install grab bars by the toilet and in the tub and shower. Do not use towel bars as grab bars. Use non-skid mats or decals in the tub or shower. If you need to sit down in the shower, use a plastic, non-slip stool. Keep the floor dry. Clean up any water that spills on the floor as soon as it happens. Remove soap buildup in the tub or shower regularly. Attach bath mats securely with double-sided non-slip rug tape. Do not have throw rugs and other things on the floor that can make you  trip. What can I do in the bedroom? Use night lights. Make sure that you have a light by your bed that is easy to reach. Do not use any sheets or blankets that are too big for your bed. They should not hang down onto the floor. Have a firm chair that has side arms. You can use this for support while you get dressed. Do not have throw rugs and other things on the floor that can make you trip. What can I do in the kitchen? Clean up any spills right away. Avoid walking on wet floors. Keep items that you use a lot in easy-to-reach places. If you need to reach something above you, use a strong step stool that has a grab bar. Keep electrical cords out of the way. Do not use floor polish or wax that makes floors slippery. If you must use wax, use non-skid floor wax. Do not have throw rugs and other things on the floor that can make you trip. What can I do with my stairs? Do not leave any items on the stairs. Make sure that there are handrails on both sides of the stairs and use them. Fix handrails that are broken or  loose. Make sure that handrails are as long as the stairways. Check any carpeting to make sure that it is firmly attached to the stairs. Fix any carpet that is loose or worn. Avoid having throw rugs at the top or bottom of the stairs. If you do have throw rugs, attach them to the floor with carpet tape. Make sure that you have a light switch at the top of the stairs and the bottom of the stairs. If you do not have them, ask someone to add them for you. What else can I do to help prevent falls? Wear shoes that: Do not have high heels. Have rubber bottoms. Are comfortable and fit you well. Are closed at the toe. Do not wear sandals. If you use a stepladder: Make sure that it is fully opened. Do not climb a closed stepladder. Make sure that both sides of the stepladder are locked into place. Ask someone to hold it for you, if possible. Clearly mark and make sure that you can  see: Any grab bars or handrails. First and last steps. Where the edge of each step is. Use tools that help you move around (mobility aids) if they are needed. These include: Canes. Walkers. Scooters. Crutches. Turn on the lights when you go into a dark area. Replace any light bulbs as soon as they burn out. Set up your furniture so you have a clear path. Avoid moving your furniture around. If any of your floors are uneven, fix them. If there are any pets around you, be aware of where they are. Review your medicines with your doctor. Some medicines can make you feel dizzy. This can increase your chance of falling. Ask your doctor what other things that you can do to help prevent falls. This information is not intended to replace advice given to you by your health care provider. Make sure you discuss any questions you have with your health care provider. Document Released: 07/08/2009 Document Revised: 02/17/2016 Document Reviewed: 10/16/2014 Elsevier Interactive Patient Education  2017 [employer].

## 2023-04-11 NOTE — Progress Notes (Signed)
Subjective:   Patricia Leonard is a 72 y.o. female who presents for Medicare Annual (Subsequent) preventive examination.  This wellness visit is conducted by a nurse.  The patient's medications were reviewed and reconciled since the patient's last visit.  History details were provided by the patient.  The history appears to be reliable.    Medical History: Patient history and Family history was reviewed  Medications, Allergies, and preventative health maintenance was reviewed and updated.   Visit Complete: In person  Patient Medicare AWV questionnaire was completed by the patient on 04/10/23; I have confirmed that all information answered by patient is correct and no changes since this date.  Cardiac Risk Factors include: advanced age (>83men, >57 women);dyslipidemia;obesity (BMI >30kg/m2)     Objective:    Today's Vitals   04/11/23 1544  BP: 108/60  Pulse: 61  Resp: 16  SpO2: 94%  Weight: 204 lb (92.5 kg)  Height: 5' 6.5" (1.689 m)  PainSc: 0-No pain   Body mass index is 32.43 kg/m.     04/11/2023    3:02 PM 01/19/2021    3:59 PM 01/27/2020    4:19 PM 04/03/2018    1:14 PM  Advanced Directives  Does Patient Have a Medical Advance Directive? Yes Yes Yes Yes  Type of Special educational needs teacher of [place];Living will Healthcare Power of [place];Living will Living will  Does patient want to make changes to medical advance directive? No - Patient declined No - Patient declined    Copy of Healthcare Power of Attorney in Chart?  No - copy requested      Current Medications (verified) Outpatient Encounter Medications as of 04/11/2023  Medication Sig   Biotin 10 MG CAPS Take by mouth.   calcium-vitamin D (OSCAL WITH D) 500-200 MG-UNIT TABS tablet Take by mouth.   clotrimazole-betamethasone (LOTRISONE) cream Apply topically.   cyanocobalamin 1000 MCG tablet Take by mouth.   dicyclomine (BENTYL) 10 MG capsule Take 1 capsule (10 mg total) by mouth 3 (three) times daily  before meals. As needed   hydrOXYzine (VISTARIL) 25 MG capsule TAKE 1 CAPSULE (25 MG TOTAL) BY MOUTH EVERY 8 (EIGHT) HOURS AS NEEDED.   ketoconazole (NIZORAL) 2 % cream Apply to lower abdominal rash   lisinopril (ZESTRIL) 5 MG tablet Take 1 tablet by mouth daily.   magnesium hydroxide (MILK OF MAGNESIA) 400 MG/5ML suspension Take 15 mLs by mouth daily as needed for mild constipation.   metoCLOPramide (REGLAN) 5 MG tablet Take 1 tablet (5 mg total) by mouth every 8 (eight) hours as needed for nausea or vomiting.   ondansetron (ZOFRAN) 4 MG tablet Take 4 mg by mouth every 8 (eight) hours as needed for nausea or vomiting.   pyridoxine (B-6) 100 MG tablet Take by mouth.   No facility-administered encounter medications on file as of 04/11/2023.    Allergies (verified) Statins, Amoxicillin-pot clavulanate, Gadolinium derivatives, Iodinated contrast media, and Iohexol   History: Past Medical History:  Diagnosis Date   Allergy    Anemia    Anxiety    Aortic atherosclerosis (HCC)    Arthritis    Cataract    Depression    Family history of colon cancer    Fibromyalgia    GERD (gastroesophageal reflux disease)    Hyperlipidemia    IBS (irritable bowel syndrome)    Moderate recurrent major depression (HCC) 08/05/2021   Rectal prolapse    Stomach ulcer    Tick bite    took antibiotics memorial  day weekend 2021 on back   Past Surgical History:  Procedure Laterality Date   APPENDECTOMY     CESAREAN SECTION     CHOLECYSTECTOMY     COLONOSCOPY  04/18/2014   Diverticulosis.    ESOPHAGOGASTRODUODENOSCOPY  04/14/2016   Schatzkis ring. Hiatal hernia. Gastritis.    HERNIA REPAIR  11/07/2021   Hiatal   LIPOMA EXCISION     NISSEN FUNDOPLICATION  11/07/2021   [facility]   thermal ablation     TONSILLECTOMY     Family History  Problem Relation Age of Onset   Colon cancer Mother    Liver cancer Father    CAD Father    Hypertension Sister    Colon cancer  Sister    Hyperlipidemia Brother    Gout Brother    Diabetes Maternal Grandmother    Diabetes Paternal Grandmother    Heart attack Other    Cancer Other        Leiomyosarcome and Liver   Migraines Other    Cancer Maternal Aunt        "female cancer, not breast"   Cancer Paternal Aunt        "female cancer, not breast"   Colon polyps Neg Hx    Esophageal cancer Neg Hx    Pancreatic cancer Neg Hx    Rectal cancer Neg Hx    Stomach cancer Neg Hx    Social History   Socioeconomic History   Marital status: Widowed    Spouse name: Not on file   Number of children: 2   Years of education: Not on file   Highest education level: High school graduate  Occupational History   Not on file  Tobacco Use   Smoking status: Never   Smokeless tobacco: Never  Vaping Use   Vaping status: Never Used  Substance and Sexual Activity   Alcohol use: Never   Drug use: Never   Sexual activity: Not Currently  Other Topics Concern   Not on file  Social History Narrative   Not on file   Social Determinants of Health   Financial Resource Strain: Medium Risk (04/10/2023)   Overall Financial Resource Strain (CARDIA)    Difficulty of Paying Living Expenses: Somewhat hard  Food Insecurity: No Food Insecurity (04/10/2023)   Hunger Vital Sign    Worried About Running Out of Food in the Last Year: Never true    Ran Out of Food in the Last Year: Never true  Transportation Needs: No Transportation Needs (04/10/2023)   PRAPARE - Administrator, Civil Service (Medical): No    Lack of Transportation (Non-Medical): No  Physical Activity: Insufficiently Active (04/10/2023)   Exercise Vital Sign    Days of Exercise per Week: 7 days    Minutes of Exercise per Session: 20 min  Stress: No Stress Concern Present (04/10/2023)   [employer] of Occupational Health - Occupational Stress Questionnaire    Feeling of Stress : Only a little  Social Connections: Moderately Integrated (04/10/2023)    Social Connection and Isolation Panel [NHANES]    Frequency of Communication with Friends and Family: Once a week    Frequency of Social Gatherings with Friends and Family: Three times a week    Attends Religious Services: More than 4 times per year    Active Member of Clubs or Organizations: Yes    Attends Banker Meetings: More than 4 times per year    Marital Status:  Widowed    Tobacco Counseling Counseling given: Not Answered   Clinical Intake:  Pre-visit preparation completed: Yes Pain : No/denies pain Pain Score: 0-No pain   BMI - recorded: 32.43 Nutritional Status: BMI > 30  Obese Nutritional Risks: None Diabetes: No How often do you need to have someone help you when you read instructions, pamphlets, or other written materials from your doctor or pharmacy?: 1 - Never What is the last grade level you completed in school?: High School graduate Interpreter Needed?: No     Activities of Daily Living    04/10/2023    5:26 PM  In your present state of health, do you have any difficulty performing the following activities:  Hearing? 0  Vision? 0  Difficulty concentrating or making decisions? 0  Walking or climbing stairs? 0  Dressing or bathing? 0  Doing errands, shopping? 0  Preparing Food and eating ? N  Using the Toilet? N  In the past six months, have you accidently leaked urine? Y  Do you have problems with loss of bowel control? Y  Managing your Medications? N  Managing your Finances? N  Housekeeping or managing your Housekeeping? Y    Patient Care Team: Blane Ohara, MD as PCP - General (Family Medicine) Lynann Bologna, MD as Consulting Physician (Gastroenterology) Marcelle Overlie, MD as Consulting Physician (Obstetrics and Gynecology) Diamond Nickel., MD as Referring Physician (Cardiology)     Assessment:   This is a routine wellness examination for [place].  Hearing/Vision screen No results found.  Dietary issues and exercise  activities discussed: Aim for 30 minutes of exercise or brisk walking, 6-8 glasses of water, and 5 servings of fruits and vegetables each day.      Depression Screen    04/11/2023    3:49 PM 03/19/2023    8:51 AM 09/27/2022   10:31 AM 05/23/2022    9:57 AM 01/03/2022   10:59 AM 08/05/2021    7:38 AM 06/27/2021    9:38 AM  PHQ 2/9 Scores  PHQ - 2 Score 2 2 0 0 0 3 0  PHQ- 9 Score 7 8 2 2  14      Fall Risk    04/10/2023    5:26 PM 03/19/2023    8:51 AM 09/27/2022   10:41 AM 01/03/2022   10:59 AM 07/04/2021    3:19 PM  Fall Risk   Falls in the past year? 1 0 0 0 0  Number falls in past yr: 0 0 0 0 0  Injury with Fall? 1 0 0 0 0  Risk for fall due to : No Fall Risks No Fall Risks No Fall Risks  No Fall Risks  Follow up Falls evaluation completed;Education provided Falls evaluation completed Falls evaluation completed  Falls evaluation completed    MEDICARE RISK AT HOME:  Medicare Risk at Home - 04/10/23 1726     Use of a cane, walker or w/c? No             TIMED UP AND GO:  Was the test performed?  Yes  Length of time to ambulate 10 feet: 5 sec Gait steady and fast without use of assistive device    Cognitive Function:        04/11/2023    3:06 PM 01/19/2021    4:06 PM  6CIT Screen  What Year? 0 points 0 points  What month? 0 points 0 points  What time? 0 points 0 points  Count back from 20 0  points 0 points  Months in reverse 0 points 0 points  Repeat phrase 0 points 0 points  Total Score 0 points 0 points    Immunizations Immunization History  Administered Date(s) Administered   Fluad Quad(high Dose 65+) 08/05/2021, 09/27/2022   Pneumococcal Polysaccharide-23 10/11/2018    TDAP status: Up to date per patient, cannot find record of vaccine  Flu Vaccine status: Up to date  Pneumococcal vaccine status: Due, Education has been provided regarding the importance of this vaccine. Advised may receive this vaccine at local pharmacy or Health Dept. Aware to  provide a copy of the vaccination record if obtained from local pharmacy or Health Dept. Verbalized acceptance and understanding.  Covid-19 vaccine status: Declined, Education has been provided regarding the importance of this vaccine but patient still declined. Advised may receive this vaccine at local pharmacy or Health Dept.or vaccine clinic. Aware to provide a copy of the vaccination record if obtained from local pharmacy or Health Dept. Verbalized acceptance and understanding.  Qualifies for Shingles Vaccine? Yes   Zostavax completed Yes   Shingrix Completed?: No.    Education has been provided regarding the importance of this vaccine. Patient has been advised to call insurance company to determine out of pocket expense if they have not yet received this vaccine. Advised may also receive vaccine at local pharmacy or Health Dept. Verbalized acceptance and understanding.  Screening Tests Health Maintenance  Topic Date Due   Hepatitis C Screening  Never done   DTaP/Tdap/Td (1 - Tdap) Never done   Zoster Vaccines- Shingrix (1 of 2) Never done   Pneumonia Vaccine 32+ Years old (2 of 2 - PCV) 10/12/2019   Colonoscopy  04/04/2023   INFLUENZA VACCINE  04/26/2023   Medicare Annual Wellness (AWV)  04/10/2024   MAMMOGRAM  03/01/2025   DEXA SCAN  Completed   HPV VACCINES  Aged Out   COVID-19 Vaccine  Discontinued    Health Maintenance  Health Maintenance Due  Topic Date Due   Hepatitis C Screening  Never done   DTaP/Tdap/Td (1 - Tdap) Never done   Zoster Vaccines- Shingrix (1 of 2) Never done   Pneumonia Vaccine 77+ Years old (2 of 2 - PCV) 10/12/2019   Colonoscopy  04/04/2023    Colorectal cancer screening: Type of screening: Colonoscopy. Completed 2019. Patient is due and will call to reschedule  Bone Density status: Completed 12/2020. Results reflect: Bone density results: OSTEOPENIA. Repeat every 2 years.  Lung Cancer Screening: (Low Dose CT Chest recommended if Age 3-80 years, 20  pack-year currently smoking OR have quit w/in 15years.) does not qualify.   Lung Cancer Screening Referral: N/A  Additional Screening:  Vision Screening: Recommended annual ophthalmology exams for early detection of glaucoma and other disorders of the eye. Is the patient up to date with their annual eye exam?  Yes  Who is the provider or what is the name of the office in which the patient attends annual eye exams? [facility]  Dental Screening: Recommended annual dental exams for proper oral hygiene   Community Resource Referral / Chronic Care Management: CRR required this visit?  No   CCM required this visit?  No     Plan:    1- Colonoscopy due - patient will call to reschedule 2- Patient will continue to monitor BP and log readings so she can report to cardiology 3- Patient requesting referral for nutritionist at [facility].  She would like to learn to eat foods that she can tolerate with  her gastroparesis as well as loose a few pounds. Verbal order given by Dr Sedalia Muta.  I have personally reviewed and noted the following in the patient's chart:   Medical and social history Use of alcohol, tobacco or illicit drugs  Current medications and supplements including opioid prescriptions.  Functional ability and status Nutritional status Physical activity Advanced directives List of other physicians Hospitalizations, surgeries, and ER visits in previous 12 months Vitals Screenings to include cognitive, depression, and falls Referrals and appointments  In addition, I have reviewed and discussed with patient certain preventive protocols, quality metrics, and best practice recommendations. A written personalized care plan for preventive services as well as general preventive health recommendations were provided to patient.     Jacklynn Bue, LPN   [id number]   After Visit Summary: Available on MyChart

## 2023-04-30 ENCOUNTER — Encounter: Payer: Self-pay | Admitting: Family Medicine

## 2023-04-30 ENCOUNTER — Ambulatory Visit (INDEPENDENT_AMBULATORY_CARE_PROVIDER_SITE_OTHER): Payer: PPO | Admitting: Family Medicine

## 2023-04-30 ENCOUNTER — Other Ambulatory Visit: Payer: PPO

## 2023-04-30 VITALS — BP 128/74 | HR 50 | Temp 97.0°F | Ht 66.5 in | Wt 207.0 lb

## 2023-04-30 DIAGNOSIS — Z1211 Encounter for screening for malignant neoplasm of colon: Secondary | ICD-10-CM | POA: Diagnosis not present

## 2023-04-30 DIAGNOSIS — M791 Myalgia, unspecified site: Secondary | ICD-10-CM

## 2023-04-30 DIAGNOSIS — K219 Gastro-esophageal reflux disease without esophagitis: Secondary | ICD-10-CM

## 2023-04-30 DIAGNOSIS — I10 Essential (primary) hypertension: Secondary | ICD-10-CM

## 2023-04-30 DIAGNOSIS — E782 Mixed hyperlipidemia: Secondary | ICD-10-CM

## 2023-04-30 DIAGNOSIS — T466X5A Adverse effect of antihyperlipidemic and antiarteriosclerotic drugs, initial encounter: Secondary | ICD-10-CM | POA: Diagnosis not present

## 2023-04-30 DIAGNOSIS — K3184 Gastroparesis: Secondary | ICD-10-CM

## 2023-04-30 DIAGNOSIS — I700 Atherosclerosis of aorta: Secondary | ICD-10-CM

## 2023-04-30 DIAGNOSIS — F5101 Primary insomnia: Secondary | ICD-10-CM

## 2023-04-30 DIAGNOSIS — R7303 Prediabetes: Secondary | ICD-10-CM

## 2023-04-30 MED ORDER — ONDANSETRON HCL 4 MG PO TABS: 4.0000 mg | ORAL_TABLET | Freq: Three times a day (TID) | ORAL | 4 refills | Status: DC | PRN

## 2023-04-30 NOTE — Patient Instructions (Addendum)
Speak with [facility] about record for pneumonia vaccine. Try melatonin for sleep. Can take up to 15 mg.

## 2023-04-30 NOTE — Assessment & Plan Note (Signed)
The current medical regimen is effective;  continue present plan and medications.  

## 2023-04-30 NOTE — Assessment & Plan Note (Signed)
Continue reglan

## 2023-04-30 NOTE — Assessment & Plan Note (Signed)
Well controlled.  No medicines.  Continue to work on eating a healthy diet and exercise.

## 2023-04-30 NOTE — Progress Notes (Unsigned)
Subjective:  Patient ID: Jimmie Molly, female    DOB: 04/10/1951  Age: 72 y.o. MRN: [id number]  Chief Complaint  Patient presents with   Medical Management of Chronic Issues    HPI   HTN: Patient is currently taking lisinopril 5 mg 1/2 tablet (2.5 mg) if sbp is <110.   IBS with Constipation: Patient has Bentyl 10 mg take 1 capsule 3 times daily PRN stomach cramps/pain and for gastroparesis has reglan 5 mg 1/2 once daily. She takes zofran which helps.    Aortic atherosclerosis/hyperlipidemia: currently on no statin medicines due to development of muscle pain.  LDL is 131 which is too high.  Triglycerides 162.  Also to high.  Prediabetes is stable.  A1c is 6.     04/30/2023    1:29 PM 04/11/2023    3:49 PM 03/19/2023    8:51 AM 09/27/2022   10:31 AM 05/23/2022    9:57 AM  Depression screen PHQ 2/9  Decreased Interest 1 1 1  0 0  Down, Depressed, Hopeless 0 1 1 0 0  PHQ - 2 Score 1 2 2  0 0  Altered sleeping 2 2 3 1  0  Tired, decreased energy 1 1 1 1  0  Change in appetite 2 1 1  0 1  Feeling bad or failure about yourself  0 0 0 0 1  Trouble concentrating 1 1 1  0 0  Moving slowly or fidgety/restless 0 0 0 0 0  Suicidal thoughts 0 0 0 0 0  PHQ-9 Score 7 7 8 2 2   Difficult doing work/chores Not difficult at all Not difficult at all Not difficult at all Not difficult at all Somewhat difficult        04/30/2023    1:29 PM  Fall Risk   Falls in the past year? 1  Number falls in past yr: 0  Injury with Fall? 1  Risk for fall due to : No Fall Risks  Follow up Falls evaluation completed    Patient Care Team: Blane Ohara, MD as PCP - General (Family Medicine) Lynann Bologna, MD as Consulting Physician (Gastroenterology) Marcelle Overlie, MD as Consulting Physician (Obstetrics and Gynecology) Diamond Nickel., MD as Referring Physician (Cardiology)   Review of Systems  Constitutional:  Negative for chills, fatigue and fever.  HENT:  Negative for congestion, ear pain, rhinorrhea  and sore throat.   Respiratory:  Negative for cough and shortness of breath.   Cardiovascular:  Negative for chest pain.  Gastrointestinal:  Positive for nausea (sometimes). Negative for abdominal pain, constipation, diarrhea and vomiting.  Genitourinary:  Negative for dysuria and urgency.  Musculoskeletal:  Negative for back pain and myalgias.  Neurological:  Positive for headaches. Negative for dizziness, weakness and light-headedness.  Psychiatric/Behavioral:  Negative for dysphoric mood. The patient is not nervous/anxious.     Current Outpatient Medications on File Prior to Visit  Medication Sig Dispense Refill   Biotin 10 MG CAPS Take by mouth.     calcium-vitamin D (OSCAL WITH D) 500-200 MG-UNIT TABS tablet Take by mouth.     clotrimazole-betamethasone (LOTRISONE) cream Apply topically.     cyanocobalamin 1000 MCG tablet Take by mouth.     dicyclomine (BENTYL) 10 MG capsule Take 1 capsule (10 mg total) by mouth 3 (three) times daily before meals. As needed 90 capsule 8   hydrOXYzine (VISTARIL) 25 MG capsule TAKE 1 CAPSULE (25 MG TOTAL) BY MOUTH EVERY 8 (EIGHT) HOURS AS NEEDED. 270 capsule 1   ketoconazole (NIZORAL)  2 % cream Apply to lower abdominal rash 15 g 0   lisinopril (ZESTRIL) 5 MG tablet Take 1 tablet by mouth daily.     magnesium hydroxide (MILK OF MAGNESIA) 400 MG/5ML suspension Take 15 mLs by mouth daily as needed for mild constipation.     metoCLOPramide (REGLAN) 5 MG tablet Take 1 tablet (5 mg total) by mouth every 8 (eight) hours as needed for nausea or vomiting. 60 tablet 1   pyridoxine (B-6) 100 MG tablet Take by mouth.     No current facility-administered medications on file prior to visit.   Past Medical History:  Diagnosis Date   Allergy    Anemia    Anxiety    Aortic atherosclerosis (HCC)    Arthritis    Cataract    Depression    Family history of colon cancer    Fibromyalgia    GERD (gastroesophageal reflux disease)    Hyperlipidemia    IBS (irritable  bowel syndrome)    Moderate recurrent major depression (HCC) 08/05/2021   Rectal prolapse    Stomach ulcer    Tick bite    took antibiotics memorial day weekend 2021 on back   Past Surgical History:  Procedure Laterality Date   APPENDECTOMY     CESAREAN SECTION     CHOLECYSTECTOMY     COLONOSCOPY  04/18/2014   Diverticulosis.    ESOPHAGOGASTRODUODENOSCOPY  04/14/2016   Schatzkis ring. Hiatal hernia. Gastritis.    HERNIA REPAIR  11/07/2021   Hiatal   LIPOMA EXCISION     NISSEN FUNDOPLICATION  11/07/2021   [facility]   thermal ablation     TONSILLECTOMY      Family History  Problem Relation Age of Onset   Colon cancer Mother    Liver cancer Father    CAD Father    Hypertension Sister    Colon cancer Sister    Hyperlipidemia Brother    Gout Brother    Diabetes Maternal Grandmother    Diabetes Paternal Grandmother    Heart attack Other    Cancer Other        Leiomyosarcome and Liver   Migraines Other    Cancer Maternal Aunt        "female cancer, not breast"   Cancer Paternal Aunt        "female cancer, not breast"   Colon polyps Neg Hx    Esophageal cancer Neg Hx    Pancreatic cancer Neg Hx    Rectal cancer Neg Hx    Stomach cancer Neg Hx    Social History   Socioeconomic History   Marital status: Widowed    Spouse name: Not on file   Number of children: 2   Years of education: Not on file   Highest education level: High school graduate  Occupational History   Not on file  Tobacco Use   Smoking status: Never   Smokeless tobacco: Never  Vaping Use   Vaping status: Never Used  Substance and Sexual Activity   Alcohol use: Never   Drug use: Never   Sexual activity: Not Currently  Other Topics Concern   Not on file  Social History Narrative   Not on file   Social Determinants of Health   Financial Resource Strain: Medium Risk (04/10/2023)   Overall Financial Resource Strain (CARDIA)    Difficulty of Paying Living  Expenses: Somewhat hard  Food Insecurity: No Food Insecurity (04/10/2023)   Hunger Vital Sign  Worried About Programme researcher, broadcasting/film/video in the Last Year: Never true    Ran Out of Food in the Last Year: Never true  Transportation Needs: No Transportation Needs (04/10/2023)   PRAPARE - Administrator, Civil Service (Medical): No    Lack of Transportation (Non-Medical): No  Physical Activity: Insufficiently Active (04/10/2023)   Exercise Vital Sign    Days of Exercise per Week: 7 days    Minutes of Exercise per Session: 20 min  Stress: No Stress Concern Present (04/10/2023)   [employer] of Occupational Health - Occupational Stress Questionnaire    Feeling of Stress : Only a little  Social Connections: Moderately Integrated (04/10/2023)   Social Connection and Isolation Panel [NHANES]    Frequency of Communication with Friends and Family: Once a week    Frequency of Social Gatherings with Friends and Family: Three times a week    Attends Religious Services: More than 4 times per year    Active Member of Clubs or Organizations: Yes    Attends Banker Meetings: More than 4 times per year    Marital Status: Widowed    Objective:  BP 128/74   Pulse (!) 50   Temp (!) 97 F (36.1 C)   Ht 5' 6.5" (1.689 m)   Wt 207 lb (93.9 kg)   SpO2 99%   BMI 32.91 kg/m      04/30/2023    1:33 PM 04/11/2023    3:44 PM 03/19/2023    8:46 AM  BP/Weight  Systolic BP 128 108 122  Diastolic BP 74 60 68  Wt. (Lbs) 207 204 204  BMI 32.91 kg/m2 32.43 kg/m2 32.43 kg/m2    Physical Exam Vitals reviewed.  Constitutional:      Appearance: Normal appearance. She is obese.  Neck:     Vascular: No carotid bruit.  Cardiovascular:     Rate and Rhythm: Normal rate and regular rhythm.     Heart sounds: Normal heart sounds.  Pulmonary:     Effort: Pulmonary effort is normal. No respiratory distress.     Breath sounds: Normal breath sounds.  Abdominal:     General: Abdomen is flat.  Bowel sounds are normal.     Palpations: Abdomen is soft.     Tenderness: There is no abdominal tenderness.  Neurological:     Mental Status: She is alert and oriented to person, place, and time.  Psychiatric:        Mood and Affect: Mood normal.        Behavior: Behavior normal.     Diabetic Foot Exam - Simple   No data filed      Lab Results  Component Value Date   WBC 3.3 (L) 04/30/2023   HGB 13.9 04/30/2023   HCT 43.1 04/30/2023   PLT 212 04/30/2023   GLUCOSE 90 04/30/2023   CHOL 211 (H) 04/30/2023   TRIG 162 (H) 04/30/2023   HDL 51 04/30/2023   LDLCALC 131 (H) 04/30/2023   ALT 14 04/30/2023   AST 15 04/30/2023   NA 141 04/30/2023   K 4.5 04/30/2023   CL 104 04/30/2023   CREATININE 0.82 04/30/2023   BUN 14 04/30/2023   CO2 24 04/30/2023   TSH 2.910 08/05/2021   HGBA1C 6.0 (H) 04/30/2023      Assessment & Plan:    Gastroparesis Assessment & Plan: Continue reglan.    Mixed hyperlipidemia Assessment & Plan: Not at goal.  Recommended Zetia 10 mg daily.  Continue to work on eating a healthy diet and exercise.     Prediabetes Assessment & Plan: Stable.  Continue to work on [employer] and exercise.   Gastroesophageal reflux disease without esophagitis Assessment & Plan: The current medical regimen is effective;  continue present plan and medications.    Essential hypertension, benign Assessment & Plan: Blood pressure well-controlled.   Continue lisinopril 5 mg daily.    Screening for colon cancer Assessment & Plan: Referred to GI for colonoscopy  Orders: -     Ambulatory referral to Gastroenterology  Aortic atherosclerosis [facility]) Assessment & Plan: Recommend start Zetia.   Myalgia due to statin Assessment & Plan: Intolerant to statins.   Primary insomnia Assessment & Plan: Trial of melatonin.  May take up to 50 mg nightly   Other orders -     Ondansetron HCl; Take 1 tablet (4 mg total) by mouth every 8 (eight) hours as needed  for nausea or vomiting.  Dispense: 20 tablet; Refill: 4     Meds ordered this encounter  Medications   ondansetron (ZOFRAN) 4 MG tablet    Sig: Take 1 tablet (4 mg total) by mouth every 8 (eight) hours as needed for nausea or vomiting.    Dispense:  20 tablet    Refill:  4    Orders Placed This Encounter  Procedures   Ambulatory referral to Gastroenterology     Follow-up: Return in about 3 months (around 07/31/2023) for chronic.   I,Katherina A Bramblett,acting as a scribe for Blane Ohara, MD.,have documented all relevant documentation on the behalf of Blane Ohara, MD,as directed by  Blane Ohara, MD while in the presence of Blane Ohara, MD.   An After Visit Summary was printed and given to the patient.  Blane Ohara, MD  Family Practice [phone]

## 2023-04-30 NOTE — Assessment & Plan Note (Signed)
Continue lisinopril 5 mg daily.

## 2023-05-02 ENCOUNTER — Other Ambulatory Visit: Payer: Self-pay

## 2023-05-02 DIAGNOSIS — E782 Mixed hyperlipidemia: Secondary | ICD-10-CM

## 2023-05-02 MED ORDER — EZETIMIBE 10 MG PO TABS
10.0000 mg | ORAL_TABLET | Freq: Every day | ORAL | 3 refills | Status: DC
Start: 2023-05-02 — End: 2023-08-01

## 2023-05-03 DIAGNOSIS — F5101 Primary insomnia: Secondary | ICD-10-CM | POA: Insufficient documentation

## 2023-05-03 DIAGNOSIS — Z1211 Encounter for screening for malignant neoplasm of colon: Secondary | ICD-10-CM | POA: Insufficient documentation

## 2023-05-03 NOTE — Assessment & Plan Note (Signed)
Referred to GI for colonoscopy 

## 2023-05-03 NOTE — Assessment & Plan Note (Signed)
Trial of melatonin.  May take up to 50 mg nightly

## 2023-05-03 NOTE — Assessment & Plan Note (Signed)
Intolerant to statins. 

## 2023-05-03 NOTE — Assessment & Plan Note (Signed)
Stable.  Continue to work on [employer] and exercise.

## 2023-05-03 NOTE — Assessment & Plan Note (Signed)
Recommend start Zetia.

## 2023-05-18 DIAGNOSIS — Z713 Dietary counseling and surveillance: Secondary | ICD-10-CM | POA: Diagnosis not present

## 2023-06-06 ENCOUNTER — Encounter: Payer: Self-pay | Admitting: Family Medicine

## 2023-06-06 ENCOUNTER — Ambulatory Visit (INDEPENDENT_AMBULATORY_CARE_PROVIDER_SITE_OTHER): Payer: PPO | Admitting: Family Medicine

## 2023-06-06 VITALS — BP 114/64 | HR 84 | Temp 97.8°F | Resp 18 | Ht 66.5 in | Wt 204.0 lb

## 2023-06-06 DIAGNOSIS — L659 Nonscarring hair loss, unspecified: Secondary | ICD-10-CM | POA: Diagnosis not present

## 2023-06-06 DIAGNOSIS — E559 Vitamin D deficiency, unspecified: Secondary | ICD-10-CM | POA: Diagnosis not present

## 2023-06-06 DIAGNOSIS — N3000 Acute cystitis without hematuria: Secondary | ICD-10-CM

## 2023-06-06 DIAGNOSIS — D729 Disorder of white blood cells, unspecified: Secondary | ICD-10-CM | POA: Diagnosis not present

## 2023-06-06 LAB — POCT URINALYSIS DIP (CLINITEK)
Bilirubin, UA: NEGATIVE
Blood, UA: NEGATIVE
Glucose, UA: NEGATIVE mg/dL
Ketones, POC UA: NEGATIVE mg/dL
Leukocytes, UA: NEGATIVE
Nitrite, UA: NEGATIVE
POC PROTEIN,UA: NEGATIVE
Spec Grav, UA: 1.015 (ref 1.010–1.025)
Urobilinogen, UA: 0.2 U/dL
pH, UA: 6.5 (ref 5.0–8.0)

## 2023-06-06 MED ORDER — FLUCONAZOLE 150 MG PO TABS
150.0000 mg | ORAL_TABLET | Freq: Every day | ORAL | 0 refills | Status: DC
Start: 2023-06-06 — End: 2023-08-01

## 2023-06-06 MED ORDER — NITROFURANTOIN MONOHYD MACRO 100 MG PO CAPS
100.0000 mg | ORAL_CAPSULE | Freq: Two times a day (BID) | ORAL | 0 refills | Status: AC
Start: 2023-06-06 — End: 2023-06-13

## 2023-06-06 NOTE — Assessment & Plan Note (Signed)
Urine culture ordered Increase water intake Try AZO products for relief of symptoms

## 2023-06-06 NOTE — Progress Notes (Signed)
Acute Office Visit  Subjective:    Patient ID: Patricia Leonard, female    DOB: Mar 14, 1951, 72 y.o.   MRN: [id number]  Chief Complaint  Patient presents with   Dysuria    HPI: Patient is in today for low pelvic pressure and pain with pain right flank.  She did a home urine test which was positive for nitrates and leukocytes.  Patient states that she recently changed her laundry detergent To Gain. Denies new lotions, soaps or new unwashed underwear. Patient is concerned about her abnormal blood count from last month and would like it to be rechecked today. She is also concerned about increasing hair loss/thinning.    Past Medical History:  Diagnosis Date   Allergy    Anemia    Anxiety    Aortic atherosclerosis (HCC)    Arthritis    Cataract    Depression    Family history of colon cancer    Fibromyalgia    GERD (gastroesophageal reflux disease)    Hyperlipidemia    IBS (irritable bowel syndrome)    Moderate recurrent major depression (HCC) 08/05/2021   Rectal prolapse    Stomach ulcer    Tick bite    took antibiotics memorial day weekend 2021 on back    Past Surgical History:  Procedure Laterality Date   APPENDECTOMY     CESAREAN SECTION     CHOLECYSTECTOMY     COLONOSCOPY  04/18/2014   Diverticulosis.    ESOPHAGOGASTRODUODENOSCOPY  04/14/2016   Schatzkis ring. Hiatal hernia. Gastritis.    HERNIA REPAIR  11/07/2021   Hiatal   LIPOMA EXCISION     NISSEN FUNDOPLICATION  11/07/2021   [facility]   thermal ablation     TONSILLECTOMY      Family History  Problem Relation Age of Onset   Colon cancer Mother    Liver cancer Father    CAD Father    Hypertension Sister    Colon cancer Sister    Hyperlipidemia Brother    Gout Brother    Diabetes Maternal Grandmother    Diabetes Paternal Grandmother    Heart attack Other    Cancer Other        Leiomyosarcome and Liver   Migraines Other    Cancer Maternal Aunt        "female  cancer, not breast"   Cancer Paternal Aunt        "female cancer, not breast"   Colon polyps Neg Hx    Esophageal cancer Neg Hx    Pancreatic cancer Neg Hx    Rectal cancer Neg Hx    Stomach cancer Neg Hx     Social History   Socioeconomic History   Marital status: Widowed    Spouse name: Not on file   Number of children: 2   Years of education: Not on file   Highest education level: High school graduate  Occupational History   Not on file  Tobacco Use   Smoking status: Never   Smokeless tobacco: Never  Vaping Use   Vaping status: Never Used  Substance and Sexual Activity   Alcohol use: Never   Drug use: Never   Sexual activity: Not Currently  Other Topics Concern   Not on file  Social History Narrative   Not on file   Social Determinants of Health   Financial Resource Strain: Medium Risk (04/10/2023)   Overall Financial Resource Strain (CARDIA)    Difficulty of Paying  Living Expenses: Somewhat hard  Food Insecurity: No Food Insecurity (04/10/2023)   Hunger Vital Sign    Worried About Running Out of Food in the Last Year: Never true    Ran Out of Food in the Last Year: Never true  Transportation Needs: No Transportation Needs (04/10/2023)   PRAPARE - Administrator, Civil Service (Medical): No    Lack of Transportation (Non-Medical): No  Physical Activity: Insufficiently Active (04/10/2023)   Exercise Vital Sign    Days of Exercise per Week: 7 days    Minutes of Exercise per Session: 20 min  Stress: No Stress Concern Present (04/10/2023)   [employer] of Occupational Health - Occupational Stress Questionnaire    Feeling of Stress : Only a little  Social Connections: Moderately Integrated (04/10/2023)   Social Connection and Isolation Panel [NHANES]    Frequency of Communication with Friends and Family: Once a week    Frequency of Social Gatherings with Friends and Family: Three times a week    Attends Religious Services: More than 4 times per year     Active Member of Clubs or Organizations: Yes    Attends Banker Meetings: More than 4 times per year    Marital Status: Widowed  Intimate Partner Violence: Not At Risk (04/30/2023)   Humiliation, Afraid, Rape, and Kick questionnaire    Fear of Current or Ex-Partner: No    Emotionally Abused: No    Physically Abused: No    Sexually Abused: No    Outpatient Medications Prior to Visit  Medication Sig Dispense Refill   Biotin 10 MG CAPS Take by mouth.     calcium-vitamin D (OSCAL WITH D) 500-200 MG-UNIT TABS tablet Take by mouth.     clotrimazole-betamethasone (LOTRISONE) cream Apply topically.     cyanocobalamin 1000 MCG tablet Take by mouth.     dicyclomine (BENTYL) 10 MG capsule Take 1 capsule (10 mg total) by mouth 3 (three) times daily before meals. As needed 90 capsule 8   ezetimibe (ZETIA) 10 MG tablet Take 1 tablet (10 mg total) by mouth daily. 90 tablet 3   hydrOXYzine (VISTARIL) 25 MG capsule TAKE 1 CAPSULE (25 MG TOTAL) BY MOUTH EVERY 8 (EIGHT) HOURS AS NEEDED. 270 capsule 1   ketoconazole (NIZORAL) 2 % cream Apply to lower abdominal rash 15 g 0   lisinopril (ZESTRIL) 5 MG tablet Take 1 tablet by mouth daily.     magnesium hydroxide (MILK OF MAGNESIA) 400 MG/5ML suspension Take 15 mLs by mouth daily as needed for mild constipation.     metoCLOPramide (REGLAN) 5 MG tablet Take 1 tablet (5 mg total) by mouth every 8 (eight) hours as needed for nausea or vomiting. 60 tablet 1   ondansetron (ZOFRAN) 4 MG tablet Take 1 tablet (4 mg total) by mouth every 8 (eight) hours as needed for nausea or vomiting. 20 tablet 4   pyridoxine (B-6) 100 MG tablet Take by mouth.     No facility-administered medications prior to visit.    Allergies  Allergen Reactions   Statins     Muscle pain   Amoxicillin-Pot Clavulanate Nausea Only    Stomach pain    Gadolinium Derivatives Rash   Iodinated Contrast Media Other (See Comments) and Rash     Desc: HAD FACIAL RASH WITH CT CONTRAST  IN 2006 HAD FACIAL RASH WITH CT CONTRAST IN 2006  Desc: HAD FACIAL RASH WITH CT CONTRAST IN 2006    Iohexol Rash  HAD FACIAL RASH WITH CT CONTRAST IN 2006     Review of Systems  Constitutional:  Positive for chills and fatigue.  Gastrointestinal:  Positive for abdominal pain (low pelvic pressure) and nausea.  Genitourinary:  Positive for flank pain (right side).  Musculoskeletal:  Positive for back pain.  Neurological:  Positive for headaches. Negative for dizziness.  Psychiatric/Behavioral:  Negative for dysphoric mood. The patient is not nervous/anxious.        Objective:        06/06/2023   10:00 AM 04/30/2023    1:33 PM 04/11/2023    3:44 PM  Vitals with BMI  Height 5' 6.5" 5' 6.5" 5' 6.5"  Weight 204 lbs 207 lbs 204 lbs  BMI 32.44 32.91 32.44  Systolic 114 128 440  Diastolic 64 74 60  Pulse 84 50 61    No data found.   Physical Exam Constitutional:      General: She is not in acute distress.    Appearance: Normal appearance. She is obese. She is not ill-appearing.  HENT:     Head: Normocephalic.     Mouth/Throat:     Mouth: Mucous membranes are moist.  Cardiovascular:     Rate and Rhythm: Normal rate and regular rhythm.     Heart sounds: Normal heart sounds.  Pulmonary:     Effort: Pulmonary effort is normal.     Breath sounds: Normal breath sounds.  Abdominal:     General: Bowel sounds are normal.     Palpations: Abdomen is soft.     Tenderness: There is no abdominal tenderness.  Skin:    General: Skin is warm.  Neurological:     Mental Status: She is alert. Mental status is at baseline.  Psychiatric:        Mood and Affect: Mood normal.     Health Maintenance Due  Topic Date Due   Hepatitis C Screening  Never done   DTaP/Tdap/Td (1 - Tdap) Never done   Zoster Vaccines- Shingrix (1 of 2) Never done   Pneumonia Vaccine 50+ Years old (2 of 2 - PCV) 10/12/2019   Colonoscopy  04/04/2023   INFLUENZA VACCINE  04/26/2023    There are no  preventive care reminders to display for this patient.  Home test showed positive for leukocytes and nitrates.  Urine dipstick shows positive for protein.     Lab Results  Component Value Date   TSH 2.910 08/05/2021   Lab Results  Component Value Date   WBC 3.3 (L) 04/30/2023   HGB 13.9 04/30/2023   HCT 43.1 04/30/2023   MCV 90 04/30/2023   PLT 212 04/30/2023   Lab Results  Component Value Date   NA 141 04/30/2023   K 4.5 04/30/2023   CO2 24 04/30/2023   GLUCOSE 90 04/30/2023   BUN 14 04/30/2023   CREATININE 0.82 04/30/2023   BILITOT 0.5 04/30/2023   ALKPHOS 109 04/30/2023   AST 15 04/30/2023   ALT 14 04/30/2023   PROT 6.3 04/30/2023   ALBUMIN 3.9 04/30/2023   CALCIUM 9.3 04/30/2023   EGFR 75 04/30/2023   GFR 65.95 01/19/2020   Lab Results  Component Value Date   CHOL 211 (H) 04/30/2023   Lab Results  Component Value Date   HDL 51 04/30/2023   Lab Results  Component Value Date   LDLCALC 131 (H) 04/30/2023   Lab Results  Component Value Date   TRIG 162 (H) 04/30/2023   Lab Results  Component Value Date  CHOLHDL 4.1 04/30/2023   Lab Results  Component Value Date   HGBA1C 6.0 (H) 04/30/2023       Assessment & Plan:  Acute cystitis without hematuria Assessment & Plan: Urine culture ordered Increase water intake Try AZO products for relief of symptoms  Orders: -     POCT URINALYSIS DIP (CLINITEK) -     Urine Culture -     Fluconazole; Take 1 tablet (150 mg total) by mouth daily.  Dispense: 1 tablet; Refill: 0 -     Nitrofurantoin Monohyd Macro; Take 1 capsule (100 mg total) by mouth 2 (two) times daily for 7 days.  Dispense: 14 capsule; Refill: 0  Abnormal white blood cell (WBC) count -     CBC  Hair loss Assessment & Plan: Await labs/testing for assessment and recommendations.   Orders: -     Iron, TIBC and Ferritin Panel  Vitamin D deficiency Assessment & Plan: Continue taking Vitamin D Labs drawn today Await labs/testing for  assessment and recommendations.   Orders: -     VITAMIN D 25 Hydroxy (Vit-D Deficiency, Fractures)     Meds ordered this encounter  Medications   fluconazole (DIFLUCAN) 150 MG tablet    Sig: Take 1 tablet (150 mg total) by mouth daily.    Dispense:  1 tablet    Refill:  0   nitrofurantoin, macrocrystal-monohydrate, (MACROBID) 100 MG capsule    Sig: Take 1 capsule (100 mg total) by mouth 2 (two) times daily for 7 days.    Dispense:  14 capsule    Refill:  0    Orders Placed This Encounter  Procedures   Urine Culture   Iron, TIBC and Ferritin Panel   CBC   Vitamin D, 25-hydroxy   POCT URINALYSIS DIP (CLINITEK)     Follow-up: Return in about 2 months (around 08/06/2023), or if symptoms worsen or fail to improve.  An After Visit Summary was printed and given to the patient.  Total time spent on today's visit was greater than 30 minutes, including both face-to-face time and nonface-to-face time personally spent on review of chart (labs and imaging), discussing labs and goals, discussing further work-up, treatment options, referrals to specialist if needed, reviewing outside records of pertinent, answering patient's questions, and coordinating care.   Renne Crigler, FNP [facility] [phone]

## 2023-06-06 NOTE — Assessment & Plan Note (Addendum)
Continue taking Vitamin D Labs drawn today Await labs/testing for assessment and recommendations.

## 2023-06-06 NOTE — Assessment & Plan Note (Signed)
Await labs/testing for assessment and recommendations. ?

## 2023-06-07 LAB — CBC
Hematocrit: 41.5 % (ref 34.0–46.6)
Hemoglobin: 14 g/dL (ref 11.1–15.9)
MCH: 30.1 pg (ref 26.6–33.0)
MCHC: 33.7 g/dL (ref 31.5–35.7)
MCV: 89 fL (ref 79–97)
Platelets: 216 10*3/uL (ref 150–450)
RBC: 4.65 x10E6/uL (ref 3.77–5.28)
RDW: 13 % (ref 11.7–15.4)
WBC: 5.6 10*3/uL (ref 3.4–10.8)

## 2023-06-07 LAB — IRON,TIBC AND FERRITIN PANEL
Ferritin: 59 ng/mL (ref 15–150)
Iron Saturation: 18 % (ref 15–55)
Iron: 62 ug/dL (ref 27–139)
Total Iron Binding Capacity: 338 ug/dL (ref 250–450)
UIBC: 276 ug/dL (ref 118–369)

## 2023-06-07 LAB — VITAMIN D 25 HYDROXY (VIT D DEFICIENCY, FRACTURES): Vit D, 25-Hydroxy: 21.1 ng/mL — ABNORMAL LOW (ref 30.0–100.0)

## 2023-06-08 LAB — URINE CULTURE: Organism ID, Bacteria: NO GROWTH

## 2023-06-11 DIAGNOSIS — L821 Other seborrheic keratosis: Secondary | ICD-10-CM | POA: Diagnosis not present

## 2023-06-11 DIAGNOSIS — Z1283 Encounter for screening for malignant neoplasm of skin: Secondary | ICD-10-CM | POA: Diagnosis not present

## 2023-06-11 DIAGNOSIS — D225 Melanocytic nevi of trunk: Secondary | ICD-10-CM | POA: Diagnosis not present

## 2023-07-03 ENCOUNTER — Encounter: Payer: Self-pay | Admitting: Gastroenterology

## 2023-07-23 ENCOUNTER — Telehealth: Payer: Self-pay | Admitting: Gastroenterology

## 2023-07-23 NOTE — Telephone Encounter (Signed)
Good afternoon Dr Chales Abrahams   Patient was schedule for a colonoscopy 12/5. Unfortunately patient was supposed to be a rescheduled for a double procedure.  Would patient need to be rescheduled or would you be able to fit patient in.   Please advise.   Thank you

## 2023-07-24 NOTE — Telephone Encounter (Signed)
If he could do double procedure that day, will be great RG

## 2023-07-25 NOTE — Telephone Encounter (Signed)
Dr. Chales Abrahams,   Ms. Nyila is requesting that I reach out to you for some advise. She is reporting increased burping, nausea, and being extra "gassy". She says that you stopped her omeprazole after her procedure and that it was causing her diarrhea. She is wanting to know what would be a safe alternate for her to take and any other recommendations you may have for her until her procedure with you in December. Please advise.   Thank you

## 2023-07-31 DIAGNOSIS — E785 Hyperlipidemia, unspecified: Secondary | ICD-10-CM | POA: Diagnosis not present

## 2023-07-31 DIAGNOSIS — Z6831 Body mass index (BMI) 31.0-31.9, adult: Secondary | ICD-10-CM | POA: Diagnosis not present

## 2023-07-31 DIAGNOSIS — I358 Other nonrheumatic aortic valve disorders: Secondary | ICD-10-CM | POA: Diagnosis not present

## 2023-07-31 DIAGNOSIS — E6609 Other obesity due to excess calories: Secondary | ICD-10-CM | POA: Diagnosis not present

## 2023-07-31 DIAGNOSIS — E66811 Obesity, class 1: Secondary | ICD-10-CM | POA: Diagnosis not present

## 2023-07-31 DIAGNOSIS — G4733 Obstructive sleep apnea (adult) (pediatric): Secondary | ICD-10-CM | POA: Diagnosis not present

## 2023-07-31 DIAGNOSIS — I7121 Aneurysm of the ascending aorta, without rupture: Secondary | ICD-10-CM | POA: Diagnosis not present

## 2023-07-31 NOTE — Telephone Encounter (Signed)
Dr. Chales Abrahams please review attached message and advise   Thank you, PV

## 2023-07-31 NOTE — Telephone Encounter (Signed)
Lets start Protonix 40 mg p.o. daily #90, 2RF RG

## 2023-08-01 ENCOUNTER — Ambulatory Visit: Payer: PPO | Admitting: Family Medicine

## 2023-08-01 VITALS — BP 130/72 | HR 88 | Temp 98.2°F | Ht 66.5 in | Wt 206.0 lb

## 2023-08-01 DIAGNOSIS — Z23 Encounter for immunization: Secondary | ICD-10-CM | POA: Diagnosis not present

## 2023-08-01 DIAGNOSIS — I700 Atherosclerosis of aorta: Secondary | ICD-10-CM

## 2023-08-01 DIAGNOSIS — E782 Mixed hyperlipidemia: Secondary | ICD-10-CM

## 2023-08-01 DIAGNOSIS — Z1159 Encounter for screening for other viral diseases: Secondary | ICD-10-CM

## 2023-08-01 DIAGNOSIS — R7303 Prediabetes: Secondary | ICD-10-CM | POA: Diagnosis not present

## 2023-08-01 DIAGNOSIS — I10 Essential (primary) hypertension: Secondary | ICD-10-CM | POA: Diagnosis not present

## 2023-08-01 DIAGNOSIS — G4733 Obstructive sleep apnea (adult) (pediatric): Secondary | ICD-10-CM

## 2023-08-01 NOTE — Assessment & Plan Note (Signed)
Stable.  Continue to work on [employer] and exercise.

## 2023-08-01 NOTE — Assessment & Plan Note (Addendum)
Blood pressure well-controlled.   Not taking medicine.

## 2023-08-01 NOTE — Assessment & Plan Note (Addendum)
Not at goal.  Recommended start Zetia 10 mg daily. Continue to work on eating a healthy diet and exercise.

## 2023-08-01 NOTE — Progress Notes (Signed)
Subjective:  Patient ID: Patricia Leonard, female    DOB: 08/02/1951  Age: 72 y.o. MRN: [id number]  Chief Complaint  Patient presents with   Medical Management of Chronic Issues    HPI HTN: Patient is currently not taking lisinopril 5 mg 1/2 tablet (2.5 mg).Her bp has been dropping in to the low systolic 100s.    IBS with Constipation: Patient has Bentyl 10 mg take 1 capsule 3 times daily PRN stomach cramps/pain and for gastroparesis has reglan 5 mg 1/2 once daily. She takes zofran which helps.  Complaining of burping and gas pains. Patient has call in to Dr. Chales Abrahams. Needs zofran refill. Scheduled for colonoscopy and EGD on September 07, 2023.    Aortic atherosclerosis/hyperlipidemia: currently on no statin medicines due to development of muscle pain.  LDL is 131 which is too high.  Triglycerides 162.  Also to high. Has not ever started zetia.  Prediabetes is stable.  A1c is 6.  Eating fairly healthy.  Exercising: walking.      08/01/2023    1:32 PM 04/30/2023    1:29 PM 04/11/2023    3:49 PM 03/19/2023    8:51 AM 09/27/2022   10:31 AM  Depression screen PHQ 2/9  Decreased Interest 1 1 1 1  0  Down, Depressed, Hopeless 1 0 1 1 0  PHQ - 2 Score 2 1 2 2  0  Altered sleeping 1 2 2 3 1   Tired, decreased energy 1 1 1 1 1   Change in appetite 1 2 1 1  0  Feeling bad or failure about yourself  0 0 0 0 0  Trouble concentrating 0 1 1 1  0  Moving slowly or fidgety/restless 0 0 0 0 0  Suicidal thoughts 0 0 0 0 0  PHQ-9 Score 5 7 7 8 2   Difficult doing work/chores Somewhat difficult Not difficult at all Not difficult at all Not difficult at all Not difficult at all        04/30/2023    1:29 PM  Fall Risk   Falls in the past year? 1  Number falls in past yr: 0  Injury with Fall? 1  Risk for fall due to : No Fall Risks  Follow up Falls evaluation completed    Patient Care Team: Blane Ohara, MD as PCP - General (Family Medicine) Lynann Bologna, MD as Consulting Physician  (Gastroenterology) Marcelle Overlie, MD as Consulting Physician (Obstetrics and Gynecology) Diamond Nickel., MD as Referring Physician (Cardiology)   Review of Systems  Constitutional:  Negative for chills, fatigue and fever.  HENT:  Negative for congestion, ear pain, rhinorrhea and sore throat.   Respiratory:  Negative for cough and shortness of breath.   Cardiovascular:  Negative for chest pain.  Gastrointestinal:  Negative for abdominal pain, constipation, diarrhea, nausea and vomiting.  Genitourinary:  Negative for dysuria and urgency.  Musculoskeletal:  Negative for back pain and myalgias.  Neurological:  Negative for dizziness, weakness, light-headedness and headaches.  Psychiatric/Behavioral:  Negative for dysphoric mood. The patient is not nervous/anxious.     Current Outpatient Medications on File Prior to Visit  Medication Sig Dispense Refill   Biotin 10 MG CAPS Take 10 mg by mouth daily.     calcium-vitamin D (OSCAL WITH D) 500-200 MG-UNIT TABS tablet Take by mouth.     clotrimazole-betamethasone (LOTRISONE) cream Apply 1 Application topically 2 (two) times daily as needed.     cyanocobalamin 1000 MCG tablet Take 1,000 mcg by mouth daily.  dicyclomine (BENTYL) 10 MG capsule Take 1 capsule (10 mg total) by mouth 3 (three) times daily before meals. As needed 90 capsule 8   ketoconazole (NIZORAL) 2 % cream Apply to lower abdominal rash (Patient taking differently: Apply 1 Application topically daily as needed for irritation. Apply to lower abdominal rash) 15 g 0   magnesium hydroxide (MILK OF MAGNESIA) 400 MG/5ML suspension Take 15 mLs by mouth daily as needed for mild constipation.     metoCLOPramide (REGLAN) 5 MG tablet Take 1 tablet (5 mg total) by mouth every 8 (eight) hours as needed for nausea or vomiting. 60 tablet 1   ondansetron (ZOFRAN) 4 MG tablet Take 1 tablet (4 mg total) by mouth every 8 (eight) hours as needed for nausea or vomiting. 20 tablet 4   pyridoxine (B-6)  100 MG tablet Take 100 mg by mouth daily.     No current facility-administered medications on file prior to visit.   Past Medical History:  Diagnosis Date   Allergy    Anemia    Anxiety    Aortic atherosclerosis (HCC)    Arthritis    Cataract    Depression    Family history of colon cancer    Fibromyalgia    GERD (gastroesophageal reflux disease)    Hyperlipidemia    IBS (irritable bowel syndrome)    Moderate recurrent major depression (HCC) 08/05/2021   Rectal prolapse    Stomach ulcer    Tick bite    took antibiotics memorial day weekend 2021 on back   Past Surgical History:  Procedure Laterality Date   APPENDECTOMY     CESAREAN SECTION     CHOLECYSTECTOMY     COLONOSCOPY  04/18/2014   Diverticulosis.    ESOPHAGOGASTRODUODENOSCOPY  04/14/2016   Schatzkis ring. Hiatal hernia. Gastritis.    HERNIA REPAIR  11/07/2021   Hiatal   LIPOMA EXCISION     NISSEN FUNDOPLICATION  11/07/2021   [facility]   thermal ablation     TONSILLECTOMY      Family History  Problem Relation Age of Onset   Colon cancer Mother    Colon cancer Father    Liver cancer Father    CAD Father    Hypertension Sister    Hyperlipidemia Brother    Gout Brother    Cancer Maternal Aunt        "female cancer, not breast"   Cancer Paternal Aunt        "female cancer, not breast"   Diabetes Maternal Grandmother    Diabetes Paternal Grandmother    Heart attack Other    Cancer Other        Leiomyosarcome and Liver   Migraines Other    Colon polyps Neg Hx    Esophageal cancer Neg Hx    Pancreatic cancer Neg Hx    Rectal cancer Neg Hx    Stomach cancer Neg Hx    Social History   Socioeconomic History   Marital status: Widowed    Spouse name: Not on file   Number of children: 2   Years of education: Not on file   Highest education level: High school graduate  Occupational History   Not on file  Tobacco Use   Smoking status: Never   Smokeless tobacco:  Never  Vaping Use   Vaping status: Never Used  Substance and Sexual Activity   Alcohol use: Never   Drug use: Never   Sexual activity: Not Currently  Other Topics Concern   Not on file  Social History Narrative   Not on file   Social Determinants of Health   Financial Resource Strain: Medium Risk (04/10/2023)   Overall Financial Resource Strain (CARDIA)    Difficulty of Paying Living Expenses: Somewhat hard  Food Insecurity: No Food Insecurity (04/10/2023)   Hunger Vital Sign    Worried About Running Out of Food in the Last Year: Never true    Ran Out of Food in the Last Year: Never true  Transportation Needs: No Transportation Needs (04/10/2023)   PRAPARE - Administrator, Civil Service (Medical): No    Lack of Transportation (Non-Medical): No  Physical Activity: Insufficiently Active (04/10/2023)   Exercise Vital Sign    Days of Exercise per Week: 7 days    Minutes of Exercise per Session: 20 min  Stress: No Stress Concern Present (04/10/2023)   [employer] of Occupational Health - Occupational Stress Questionnaire    Feeling of Stress : Only a little  Social Connections: Moderately Integrated (04/10/2023)   Social Connection and Isolation Panel [NHANES]    Frequency of Communication with Friends and Family: Once a week    Frequency of Social Gatherings with Friends and Family: Three times a week    Attends Religious Services: More than 4 times per year    Active Member of Clubs or Organizations: Yes    Attends Banker Meetings: More than 4 times per year    Marital Status: Widowed    Objective:  BP 130/72   Pulse 88   Temp 98.2 F (36.8 C)   Ht 5' 6.5" (1.689 m)   Wt 206 lb (93.4 kg)   SpO2 98%   BMI 32.75 kg/m      08/02/2023    1:01 PM 08/01/2023    1:28 PM 06/06/2023   10:00 AM  BP/Weight  Systolic BP  130 161  Diastolic BP  72 64  Wt. (Lbs) 204 206 204  BMI 32.43 kg/m2 32.75 kg/m2 32.43 kg/m2    Physical Exam Vitals  reviewed.  Constitutional:      Appearance: Normal appearance. She is obese.  Neck:     Vascular: No carotid bruit.  Cardiovascular:     Rate and Rhythm: Normal rate and regular rhythm.     Heart sounds: Normal heart sounds.  Pulmonary:     Effort: Pulmonary effort is normal. No respiratory distress.     Breath sounds: Normal breath sounds.  Abdominal:     General: Abdomen is flat. Bowel sounds are normal.     Palpations: Abdomen is soft.     Tenderness: There is no abdominal tenderness.  Neurological:     Mental Status: She is alert and oriented to person, place, and time.  Psychiatric:        Mood and Affect: Mood normal.        Behavior: Behavior normal.     Diabetic Foot Exam - Simple   No data filed      Lab Results  Component Value Date   WBC 4.1 08/01/2023   HGB 14.7 08/01/2023   HCT 44.1 08/01/2023   PLT 204 08/01/2023   GLUCOSE 87 08/01/2023   CHOL 235 (H) 08/01/2023   TRIG 210 (H) 08/01/2023   HDL 50 08/01/2023   LDLCALC 147 (H) 08/01/2023   ALT 11 08/01/2023   AST 18 08/01/2023   NA 140 08/01/2023   K 4.1 08/01/2023   CL 102 08/01/2023  CREATININE 0.84 08/01/2023   BUN 11 08/01/2023   CO2 25 08/01/2023   TSH 2.910 08/05/2021   HGBA1C 5.9 (H) 08/01/2023      Assessment & Plan:    Essential hypertension, benign Assessment & Plan: Blood pressure well-controlled.   Not taking medicine.  Orders: -     CBC with Differential/Platelet -     Comprehensive metabolic panel  Prediabetes Assessment & Plan: Stable.  Continue to work on [employer] and exercise.  Orders: -     Hemoglobin A1c  Mixed hyperlipidemia Assessment & Plan: Not at goal.  Recommended start Zetia 10 mg daily. Continue to work on eating a healthy diet and exercise.    Orders: -     Lipid panel  Encounter for immunization -     Flu Vaccine Trivalent High Dose (Fluad)  Need for hepatitis C screening test -     HCV Ab w Reflex to Quant PCR     No orders of the  defined types were placed in this encounter.   Orders Placed This Encounter  Procedures   Flu Vaccine Trivalent High Dose (Fluad)   CBC with Differential/Platelet   Comprehensive metabolic panel   Hemoglobin A1c   Lipid panel   HCV Ab w Reflex to Quant PCR     Follow-up: Return in about 3 months (around 11/01/2023) for chronic follow up.   I,Marla I Leal-Borjas,acting as a scribe for Blane Ohara, MD.,have documented all relevant documentation on the behalf of Blane Ohara, MD,as directed by  Blane Ohara, MD while in the presence of Blane Ohara, MD.   An After Visit Summary was printed and given to the patient.  I attest that I have reviewed this visit and agree with the plan scribed by my staff.   Blane Ohara, MD [facility] [phone]

## 2023-08-02 ENCOUNTER — Ambulatory Visit: Payer: PPO | Admitting: Family Medicine

## 2023-08-02 ENCOUNTER — Other Ambulatory Visit: Payer: Self-pay

## 2023-08-02 ENCOUNTER — Ambulatory Visit: Payer: PPO

## 2023-08-02 VITALS — Ht 66.5 in | Wt 204.0 lb

## 2023-08-02 DIAGNOSIS — K449 Diaphragmatic hernia without obstruction or gangrene: Secondary | ICD-10-CM

## 2023-08-02 DIAGNOSIS — K3184 Gastroparesis: Secondary | ICD-10-CM

## 2023-08-02 DIAGNOSIS — K581 Irritable bowel syndrome with constipation: Secondary | ICD-10-CM

## 2023-08-02 DIAGNOSIS — R140 Abdominal distension (gaseous): Secondary | ICD-10-CM

## 2023-08-02 DIAGNOSIS — Z800 Family history of malignant neoplasm of digestive organs: Secondary | ICD-10-CM

## 2023-08-02 DIAGNOSIS — R110 Nausea: Secondary | ICD-10-CM

## 2023-08-02 DIAGNOSIS — Z860100 Personal history of colon polyps, unspecified: Secondary | ICD-10-CM

## 2023-08-02 DIAGNOSIS — K219 Gastro-esophageal reflux disease without esophagitis: Secondary | ICD-10-CM

## 2023-08-02 LAB — CBC WITH DIFFERENTIAL/PLATELET
Basophils Absolute: 0 10*3/uL (ref 0.0–0.2)
Basos: 1 %
EOS (ABSOLUTE): 0.1 10*3/uL (ref 0.0–0.4)
Eos: 2 %
Hematocrit: 44.1 % (ref 34.0–46.6)
Hemoglobin: 14.7 g/dL (ref 11.1–15.9)
Immature Grans (Abs): 0 10*3/uL (ref 0.0–0.1)
Immature Granulocytes: 0 %
Lymphocytes Absolute: 1.3 10*3/uL (ref 0.7–3.1)
Lymphs: 33 %
MCH: 30.7 pg (ref 26.6–33.0)
MCHC: 33.3 g/dL (ref 31.5–35.7)
MCV: 92 fL (ref 79–97)
Monocytes Absolute: 0.2 10*3/uL (ref 0.1–0.9)
Monocytes: 5 %
Neutrophils Absolute: 2.4 10*3/uL (ref 1.4–7.0)
Neutrophils: 59 %
Platelets: 204 10*3/uL (ref 150–450)
RBC: 4.79 x10E6/uL (ref 3.77–5.28)
RDW: 12.4 % (ref 11.7–15.4)
WBC: 4.1 10*3/uL (ref 3.4–10.8)

## 2023-08-02 LAB — COMPREHENSIVE METABOLIC PANEL
ALT: 11 [IU]/L (ref 0–32)
AST: 18 [IU]/L (ref 0–40)
Albumin: 4.2 g/dL (ref 3.8–4.8)
Alkaline Phosphatase: 108 [IU]/L (ref 44–121)
BUN/Creatinine Ratio: 13 (ref 12–28)
BUN: 11 mg/dL (ref 8–27)
Bilirubin Total: 0.4 mg/dL (ref 0.0–1.2)
CO2: 25 mmol/L (ref 20–29)
Calcium: 9.3 mg/dL (ref 8.7–10.3)
Chloride: 102 mmol/L (ref 96–106)
Creatinine, Ser: 0.84 mg/dL (ref 0.57–1.00)
Globulin, Total: 2.6 g/dL (ref 1.5–4.5)
Glucose: 87 mg/dL (ref 70–99)
Potassium: 4.1 mmol/L (ref 3.5–5.2)
Sodium: 140 mmol/L (ref 134–144)
Total Protein: 6.8 g/dL (ref 6.0–8.5)
eGFR: 73 mL/min/{1.73_m2} (ref >59)

## 2023-08-02 LAB — HCV AB W REFLEX TO QUANT PCR: HCV Ab: NONREACTIVE

## 2023-08-02 LAB — LIPID PANEL
Chol/HDL Ratio: 4.7 ratio — ABNORMAL HIGH (ref 0.0–4.4)
Cholesterol, Total: 235 mg/dL — ABNORMAL HIGH (ref 100–199)
HDL: 50 mg/dL (ref >39)
LDL Chol Calc (NIH): 147 mg/dL — ABNORMAL HIGH (ref 0–99)
Triglycerides: 210 mg/dL — ABNORMAL HIGH (ref 0–149)
VLDL Cholesterol Cal: 38 mg/dL (ref 5–40)

## 2023-08-02 LAB — HEMOGLOBIN A1C
Est. average glucose Bld gHb Est-mCnc: 123 mg/dL
Hgb A1c MFr Bld: 5.9 % — ABNORMAL HIGH (ref 4.8–5.6)

## 2023-08-02 MED ORDER — PANTOPRAZOLE SODIUM 40 MG PO TBEC: 40.0000 mg | DELAYED_RELEASE_TABLET | Freq: Every day | ORAL | 2 refills | Status: DC

## 2023-08-02 NOTE — Progress Notes (Signed)
No egg or soy allergy known to patient  No issues known to pt with past sedation with any surgeries or procedures Patient denies ever being told they had issues or difficulty with intubation  No FH of Malignant Hyperthermia  Pt is not on diet pills Pt is not on  home 02  Mild sleep apena does not require cpap usage  Pt is not on blood thinners  Pt with hx of IBS constipation and diarrhea  No A fib or A flutter. Reports rapid heart rate at times. Per patient may be starting medication to resolve this. Patient denies SOB and chest pain, dizziness at times.  Have any cardiac testing pending-- Echo 11/2023 LOA: independent  Prep: suprep   Patient's chart reviewed by Patricia Leonard CNRA prior to previsit and patient appropriate for the [facility].  Previsit completed and red dot placed by patient's name on their procedure day (on provider's schedule).     PV competed with patient. Prep instructions sent via mychart and home address. Pt already has prep and instructions from OV in January dates and times have been update for the patient and new copy sent.

## 2023-08-02 NOTE — Telephone Encounter (Signed)
Rx sent pt made aware

## 2023-08-05 ENCOUNTER — Encounter: Payer: Self-pay | Admitting: Family Medicine

## 2023-08-20 ENCOUNTER — Encounter (HOSPITAL_BASED_OUTPATIENT_CLINIC_OR_DEPARTMENT_OTHER): Payer: Self-pay

## 2023-08-20 ENCOUNTER — Ambulatory Visit (HOSPITAL_BASED_OUTPATIENT_CLINIC_OR_DEPARTMENT_OTHER)
Admission: EM | Admit: 2023-08-20 | Discharge: 2023-08-20 | Disposition: A | Discharge status: Home / Self Care | Payer: PPO | Attending: Internal Medicine | Admitting: Internal Medicine

## 2023-08-20 DIAGNOSIS — R109 Unspecified abdominal pain: Secondary | ICD-10-CM

## 2023-08-20 NOTE — ED Triage Notes (Signed)
Pt c/o headache on Saturday and took oxycodone with relief.  Pt c/o RUQ pain x2-3 days with constipation. States took gas relief meds and a suppository with no relief. States hx of same.

## 2023-08-20 NOTE — ED Provider Notes (Signed)
[facility]    CSN: [id number] Arrival date & time: 08/20/23  0806      History   Chief Complaint Chief Complaint  Patient presents with   Abdominal Pain   Constipation    HPI Patricia Leonard is a 72 y.o. female.    Abdominal Pain Associated symptoms: chills, constipation and nausea   Associated symptoms: no chest pain, no dysuria, no fatigue, no fever and no vomiting   Constipation Associated symptoms: abdominal pain and nausea   Associated symptoms: no dysuria, no fever and no vomiting   Acute abdominal pain onset 2 days ago pain is right upper quadrant, constant, dull, radiates to back, sharp at times, worsens with deep breath.  Admits having chills yesterday, nausea and constipation, had small stool 4 days ago.  Admits decreased appetite.  Admits intermittent lightheadedness. Denies documented fever, vomiting, diarrhea, bloody stool, urinary symptoms, cough, chest pain, shortness of breath. Has had multiple abdominal surgeries including cholecystectomy, appendectomy, hernia repair, C-section.  Past medical history includes irritable bowel syndrome, gastroparesis hiatal hernia, stomach ulcer, aortic aneurysm, dyslipidemia.  Past Medical History:  Diagnosis Date   Allergy    Anemia    Anxiety    Aortic atherosclerosis (HCC)    Arthritis    Cataract    Depression    Family history of colon cancer    Fibromyalgia    GERD (gastroesophageal reflux disease)    Hyperlipidemia    IBS (irritable bowel syndrome)    Moderate recurrent major depression (HCC) 08/05/2021   Rectal prolapse    Stomach ulcer    Tick bite    took antibiotics memorial day weekend 2021 on back    Patient Active Problem List   Diagnosis Date Noted   Hair loss 06/06/2023   Screening for colon cancer 05/03/2023   Primary insomnia 05/03/2023   Class 1 obesity due to excess calories without serious comorbidity with body mass index (BMI) of 31.0 to 31.9 in adult 03/20/2023   Localized  macular rash 03/19/2023   Swollen lymph nodes 03/19/2023   Essential hypertension, benign 01/28/2023   Pruritus 01/28/2023   Aneurysm of ascending aorta without rupture (HCC) 01/02/2023   Thyroid nodule 11/26/2022   Gastroparesis 09/27/2022   Vitamin D deficiency 09/27/2022   History of pulmonary embolism 09/27/2022   Prediabetes 09/27/2022   Chest wall pain 09/27/2022   Myalgia due to statin 05/27/2022   Acute cystitis without hematuria 05/25/2022   BMI 33.0-33.9,adult 02/16/2022   Osteopenia 08/05/2021   Aortic valve sclerosis 01/03/2021   OSA (obstructive sleep apnea) 11/22/2020   Palpitations 11/22/2020   Cervical disc disorder with radiculopathy of cervical region 08/23/2020   Lumbar radiculopathy 08/23/2020   Rectocele 02/09/2020   Mixed hyperlipidemia 02/09/2020   Aortic atherosclerosis (HCC) 02/09/2020   Irritable bowel syndrome with constipation 02/09/2020   Migraine 02/09/2020   History of IBS 01/20/2020   Hx of adenomatous colonic polyps 01/20/2020   Nuclear cataract 09/13/2011   Hyperopia with astigmatism 09/13/2011    Past Surgical History:  Procedure Laterality Date   APPENDECTOMY     CESAREAN SECTION     CHOLECYSTECTOMY     COLONOSCOPY  04/18/2014   Diverticulosis.    ESOPHAGOGASTRODUODENOSCOPY  04/14/2016   Schatzkis ring. Hiatal hernia. Gastritis.    HERNIA REPAIR  11/07/2021   Hiatal   LIPOMA EXCISION     NISSEN FUNDOPLICATION  11/07/2021   [facility]   thermal ablation     TONSILLECTOMY  OB History   No obstetric history on file.      Home Medications    Prior to Admission medications   Medication Sig Start Date End Date Taking? Authorizing Provider  aspirin EC 81 MG tablet Take 81 mg by mouth daily. Swallow whole.    [provider]  Biotin 10 MG CAPS Take 10 mg by mouth daily.    [provider]  calcium-vitamin D (OSCAL WITH D) 500-200 MG-UNIT TABS tablet Take by mouth.     [provider]  clotrimazole-betamethasone (LOTRISONE) cream Apply 1 Application topically 2 (two) times daily as needed. 07/28/20   [provider]  cyanocobalamin 1000 MCG tablet Take 1,000 mcg by mouth daily.    [provider]  dicyclomine (BENTYL) 10 MG capsule Take 1 capsule (10 mg total) by mouth 3 (three) times daily before meals. As needed 01/19/20   Esterwood, Amy S, PA-C  ketoconazole (NIZORAL) 2 % cream Apply to lower abdominal rash Patient taking differently: Apply 1 Application topically daily as needed for irritation. Apply to lower abdominal rash 06/27/21   Janie Morning, NP  magnesium hydroxide (MILK OF MAGNESIA) 400 MG/5ML suspension Take 15 mLs by mouth daily as needed for mild constipation.    [provider]  metoCLOPramide (REGLAN) 5 MG tablet Take 1 tablet (5 mg total) by mouth every 8 (eight) hours as needed for nausea or vomiting. 10/30/22   Doree Albee, PA-C  Omega-3 Fatty Acids (FISH OIL PO) Take 1 tablet by mouth daily at 6 (six) AM.    [provider]  ondansetron (ZOFRAN) 4 MG tablet Take 1 tablet (4 mg total) by mouth every 8 (eight) hours as needed for nausea or vomiting. 04/30/23   Cox, Fritzi Mandes, MD  pantoprazole (PROTONIX) 40 MG tablet Take 1 tablet (40 mg total) by mouth daily. 08/02/23   Lynann Bologna, MD  pyridoxine (B-6) 100 MG tablet Take 100 mg by mouth daily.    [provider]  Vitamin D, Ergocalciferol, (DRISDOL) 1.25 MG (50000 UNIT) CAPS capsule Take 50,000 Units by mouth every 7 (seven) days.    [provider]    Family History Family History  Problem Relation Age of Onset   Colon cancer Mother    Colon cancer Father    Liver cancer Father    CAD Father    Hypertension Sister    Hyperlipidemia Brother    Gout Brother    Cancer Maternal Aunt        "female cancer, not breast"   Cancer Paternal Aunt        "female cancer, not breast"   Diabetes Maternal Grandmother    Diabetes  Paternal Grandmother    Heart attack Other    Cancer Other        Leiomyosarcome and Liver   Migraines Other    Colon polyps Neg Hx    Esophageal cancer Neg Hx    Pancreatic cancer Neg Hx    Rectal cancer Neg Hx    Stomach cancer Neg Hx     Social History Social History   Tobacco Use   Smoking status: Never   Smokeless tobacco: Never  Vaping Use   Vaping status: Never Used  Substance Use Topics   Alcohol use: Never   Drug use: Never     Allergies   Statins, Amoxicillin-pot clavulanate, Gadolinium derivatives, Iodinated contrast media, and Iohexol   Review of Systems Review of Systems  Constitutional:  Positive for appetite change and  chills. Negative for diaphoresis, fatigue and fever.  Cardiovascular:  Negative for chest pain.  Gastrointestinal:  Positive for abdominal pain, constipation and nausea. Negative for vomiting.  Genitourinary:  Negative for difficulty urinating and dysuria.  Skin:  Positive for rash (secondary to yeast, no new).  Neurological:  Positive for light-headedness.     Physical Exam Triage Vital Signs ED Triage Vitals [08/20/23 0822]  Encounter Vitals Group     BP (!) 169/84     Systolic BP Percentile      Diastolic BP Percentile      Pulse Rate 66     Resp 18     Temp 97.8 F (36.6 C)     Temp Source Oral     SpO2 96 %     Weight      Height      Head Circumference      Peak Flow      Pain Score 7     Pain Loc      Pain Education      Exclude from Growth Chart    No data found.  Updated Vital Signs BP (!) 169/84 (BP Location: Right Arm)   Pulse 66   Temp 97.8 F (36.6 C) (Oral)   Resp 18   SpO2 96%   Visual Acuity Right Eye Distance:   Left Eye Distance:   Bilateral Distance:    Right Eye Near:   Left Eye Near:    Bilateral Near:     Physical Exam Vitals and nursing note reviewed.  Constitutional:      Appearance: She is well-developed. She is not ill-appearing.  HENT:     Head: Normocephalic and atraumatic.   Cardiovascular:     Rate and Rhythm: Normal rate and regular rhythm.  Pulmonary:     Effort: Pulmonary effort is normal. No respiratory distress.     Breath sounds: Normal breath sounds. No wheezing or rhonchi.  Abdominal:     General: Bowel sounds are increased.     Palpations: Abdomen is soft.     Tenderness: There is abdominal tenderness in the right upper quadrant.  Skin:    General: Skin is warm and dry.  Neurological:     Mental Status: She is oriented to person, place, and time.      UC Treatments / Results  Labs (all labs ordered are listed, but only abnormal results are displayed) Labs Reviewed - No data to display  EKG   Radiology No results found.  Procedures Procedures (including critical care time)  Medications Ordered in UC Medications - No data to display  Initial Impression / Assessment and Plan / UC Course  I have reviewed the triage vital signs and the nursing notes.  Pertinent labs & imaging results that were available during my care of the patient were reviewed by me and considered in my medical decision making (see chart for details).     72 year old with history of multiple abdominal surgeries and conditions.  She has had right upper quadrant pain for several days associated with nausea, chills and lightheadedness.  She is concerned she may have a bowel obstruction as she has had limited stool over the last few days.  She is well-appearing but does have some right upper quadrant tenderness on exam.  Recommend ED evaluation.  She will drive self to [facility] ED Final Clinical Impressions(s) / UC Diagnoses   Final diagnoses:  Acute abdominal pain   Discharge Instructions   None  ED Prescriptions   None    PDMP not reviewed this encounter.   Meliton Rattan, [place] 08/20/23 [phone]

## 2023-08-20 NOTE — ED Notes (Signed)
Patient is being discharged from the Urgent Care and sent to the Emergency Department via POV . Per Angie,PA, patient is in need of higher level of care due to need of further evaluation of RUQ pain. Patient is aware and verbalizes understanding of plan of care.  Vitals:   08/20/23 0822  BP: (!) 169/84  Pulse: 66  Resp: 18  Temp: 97.8 F (36.6 C)  SpO2: 96%

## 2023-08-30 ENCOUNTER — Encounter: Payer: PPO | Admitting: Gastroenterology

## 2023-09-05 ENCOUNTER — Encounter: Payer: Self-pay | Admitting: Certified Registered Nurse Anesthetist

## 2023-09-07 ENCOUNTER — Encounter: Payer: Self-pay | Admitting: Gastroenterology

## 2023-09-07 ENCOUNTER — Ambulatory Visit (AMBULATORY_SURGERY_CENTER): Payer: PPO | Admitting: Gastroenterology

## 2023-09-07 VITALS — BP 112/60 | HR 66 | Temp 98.2°F | Resp 14 | Ht 66.0 in | Wt 204.0 lb

## 2023-09-07 DIAGNOSIS — K640 First degree hemorrhoids: Secondary | ICD-10-CM

## 2023-09-07 DIAGNOSIS — K2970 Gastritis, unspecified, without bleeding: Secondary | ICD-10-CM | POA: Diagnosis not present

## 2023-09-07 DIAGNOSIS — K319 Disease of stomach and duodenum, unspecified: Secondary | ICD-10-CM

## 2023-09-07 DIAGNOSIS — K5730 Diverticulosis of large intestine without perforation or abscess without bleeding: Secondary | ICD-10-CM

## 2023-09-07 DIAGNOSIS — Z1211 Encounter for screening for malignant neoplasm of colon: Secondary | ICD-10-CM

## 2023-09-07 DIAGNOSIS — K2100 Gastro-esophageal reflux disease with esophagitis, without bleeding: Secondary | ICD-10-CM | POA: Diagnosis not present

## 2023-09-07 DIAGNOSIS — Z860100 Personal history of colon polyps, unspecified: Secondary | ICD-10-CM | POA: Diagnosis not present

## 2023-09-07 DIAGNOSIS — D120 Benign neoplasm of cecum: Secondary | ICD-10-CM

## 2023-09-07 DIAGNOSIS — Q399 Congenital malformation of esophagus, unspecified: Secondary | ICD-10-CM

## 2023-09-07 DIAGNOSIS — D122 Benign neoplasm of ascending colon: Secondary | ICD-10-CM

## 2023-09-07 DIAGNOSIS — K449 Diaphragmatic hernia without obstruction or gangrene: Secondary | ICD-10-CM

## 2023-09-07 MED ORDER — SODIUM CHLORIDE 0.9 % IV SOLN: 500.0000 mL | Freq: Once | INTRAVENOUS | Status: DC

## 2023-09-07 NOTE — Progress Notes (Unsigned)
08/14/2022 Patricia Leonard [id number] 12/26/1950   Referring provider: Blane Ohara, MD Primary GI doctor: Dr. Chales Abrahams   ASSESSMENT AND PLAN:    Gastroparesis secondary to nissan fundoplication She states the reglan caused fatigue/difficult to monitor, feels she is controlling it with diet.  Continue Gas x Discussed diet, things to avoid   Acute pulmonary embolism without acute cor pulmonale, unspecified pulmonary embolism type (HCC) Currently on eliquis for 6 months, uncertain if she is staying on it or not but we did get medical clearance from Dr. Sedalia Muta to hold for 2 days prior   History of colonic polyps with Family history of colon cancer Breathing is better, will schedule for colonoscopy We have discussed the risks of bleeding, infection, perforation, medication reactions, and remote risk of death associated with colonoscopy. All questions were answered and the patient acknowledges these risk and wishes to proceed.   Irritable bowel syndrome with constipation Has a lot of gas, has BM with everytime she goes to the bathroom.  Will do 2 day colonoscopy If still having symptoms consider SIBO testing, motegrity samples and/or pelvic floor PT   Hiatal hernia with GERD  status post TIF procedure with Nissan fundoplication CT AB and pelvis 03/25 showed intact hiatal hernia repair and was normal   History of Present Illness:  72 y.o. female  with a past medical history of hyperlipidemia, GERD with history of peptic ulcer disease, diverticulosis, internal hemorrhoids, status post cholecystectomy and others listed below, returns to clinic today for follow-up of abdominal pain and nausea.   08/31/2021 OV with Dr. Chales Abrahams  for reflux with history of large hiatal hernia status post Coley, history of tubular adenoma colonoscopy 05/2018, IBS C failing Linzess, pelvic floor laxity.  At that visit patient was set up for colonoscopy and endoscopy with Dr. Chales Abrahams however she had an emergency at  [facility] and had procedures done there. Patient status post TIF procedure with Nissan fundoplication, has not had colonoscopy.    12/15/2021- CT AB and pelvis without contrast for flank pain showed normal pancreatitis, spleen, normal stomach, hiatal hernia interval repair.   12/29/2021 pneumonia with bilateral PE  now on eliquis.    06/19/2022 patient continued to complain of gas, burping, nausea after fundoplication, set up for GES which was positive.  Patient was continuing to have wheezing and shortness of breath chest x-ray unremarkable colonoscopy not scheduled due to symptoms.  We will schedule today.. 07/12/2022 gastric emptying study showed delayed gastric emptying with 36% emptied at 4 hours. Patient given trial of Reglan 5 mg and was unable to tolerate this due to fatigue. Given Motegrity samples to see if this could help with potentially IBS with constipation as well as her gastroparesis.   Patient presents for follow-up for gastroparesis and for colonoscopy. Still on Eliquis with Dr. Sedalia Muta. She states she has improved with diet for her nausea, she continues to have gas but states she is controlling this with diet as well. No AB bloating.  She states she was having constipation, now having small hard stools every time she goes to the bathroom, feels incomplete Bm's.  Had pelvic floor therapy without help.      Past GI procedures: -UGI 02/2020: Large hiatal hernia, with approximately 60% of the stomach positioned in the chest. No acute complication. -EGD 03/2018: 3 cm HH, mild gastritis.  Negative biopsies for HP.  04/14/2016-3 cm HH.  Neg SB Bx for celiac.  EGD 11/30/2014: Gastric ulcers, hiatal  hernia, neg Bx -Colonoscopy 03/2018: Normal cecum.  6 mm polyp s/p polypectomy (TA), moderate left colonic diverticulosis, internal hemorrhoids, negative random colonic biopsies for microscopic colitis.  Repeat in 5 years.  04/20/2014; mild diverticulosis, previous hemorrhoidectomy.   Otherwise normal colonoscopy -CT AP without IV contrast 01/29/2020: Large HH, soft tissue fullness in the cecum, pelvic floor laxity, constipation.  03/26/2014: Small adrenal adenoma, otherwise normal. -12/15/2021- CT AB and pelvis without contrast for flank pain showed normal pancreatitis, spleen, normal stomach, hiatal hernia interval repair.    Current Medications:            Current Outpatient Medications (Hematological):    apixaban (ELIQUIS) 5 MG TABS tablet, Take 1 tablet (5 mg total) by mouth 2 (two) times daily.   cyanocobalamin 1000 MCG tablet, Take by mouth.   Current Outpatient Medications (Other):    Prucalopride Succinate (MOTEGRITY) 2 MG TABS, Take 1 tablet (2 mg total) by mouth daily.   Biotin 10 MG CAPS, Take by mouth.   calcium-vitamin D (OSCAL WITH D) 500-200 MG-UNIT TABS tablet, Take by mouth.   clotrimazole-betamethasone (LOTRISONE) cream, Apply topically.   dicyclomine (BENTYL) 10 MG capsule, Take 1 capsule (10 mg total) by mouth 3 (three) times daily before meals. As needed   fluconazole (DIFLUCAN) 150 MG tablet, Take 1 tablet (150 mg total) by mouth daily.   ketoconazole (NIZORAL) 2 % cream, Apply to lower abdominal rash   magnesium hydroxide (MILK OF MAGNESIA) 400 MG/5ML suspension, Take 15 mLs by mouth daily as needed for mild constipation.   metoCLOPramide (REGLAN) 5 MG tablet, Take 1 tablet (5 mg total) by mouth every 8 (eight) hours as needed for up to 10 days for nausea or vomiting.   pyridoxine (B-6) 100 MG tablet, Take by mouth.   Surgical History:  She  has a past surgical history that includes Tonsillectomy; Appendectomy; Cholecystectomy; Cesarean section; Colonoscopy (04/18/2014); Esophagogastroduodenoscopy (04/14/2016); Lipoma excision; thermal ablation; Hernia repair (11/07/2021); and Nissen fundoplication (11/07/2021). Family History:  Her family history includes CAD in her father; Cancer in her maternal aunt, paternal aunt, and another family member;  Colon cancer in her mother and sister; Diabetes in her maternal grandmother and paternal grandmother; Gout in her brother; Heart attack in an other family member; Hyperlipidemia in her brother; Hypertension in her sister; Liver cancer in her father; Migraines in an other family member. Social History:   reports that she has never smoked. She has never used smokeless tobacco. She reports that she does not drink alcohol and does not use drugs.   Current Medications, Allergies, Past Medical History, Past Surgical History, Family History and Social History were reviewed in [employer] record.   Physical Exam: There were no vitals taken for this visit. General:   Pleasant, well developed female in no acute distress Heart:  Regular rate and rhythm; no murmurs Pulm: Clear anteriorly; decreased breath sounds right lower, some mild costochondral tenderness left anterior, no rash Abdomen:  Soft, Obese AB, Active bowel sounds. mild tenderness in the RUQ, epigastric. Without guarding and Without rebound, No organomegaly appreciated. Extremities:  without  edema. Neurologic:  Alert and  oriented x4;  No focal deficits.  Psych:  Cooperative. Normal mood and affect.     Doree Albee, PA-C   Attending physician's note   I have taken history, reviewed the chart and examined the patient. I performed a substantive portion of this encounter, including complete performance of at least one of the key components, in conjunction with  the APP. I agree with the Advanced Practitioner's note, impression and recommendations.    Edman Circle, MD [facility] [phone]

## 2023-09-07 NOTE — Progress Notes (Signed)
Called to room to assist during endoscopic procedure.  Patient ID and intended procedure confirmed with present staff. Received instructions for my participation in the procedure from the performing physician.  

## 2023-09-07 NOTE — Op Note (Signed)
[facility] Patient Name: Patricia Leonard Procedure Date: 09/07/2023 2:38 PM MRN: [id number] Endoscopist: Lynann Bologna , MD, [id number] Age: 72 Referring MD:  Date of Birth: 11-24-1950 Gender: Female Account #: [id number] Procedure:                Upper GI endoscopy Indications:              GERD. IDA Medicines:                Monitored Anesthesia Care Procedure:                Pre-Anesthesia Assessment:                           - Prior to the procedure, a History and Physical                            was performed, and patient medications and                            allergies were reviewed. The patient's tolerance of                            previous anesthesia was also reviewed. The risks                            and benefits of the procedure and the sedation                            options and risks were discussed with the patient.                            All questions were answered, and informed consent                            was obtained. Prior Anticoagulants: The patient has                            taken no anticoagulant or antiplatelet agents. ASA                            Grade Assessment: II - A patient with mild systemic                            disease. After reviewing the risks and benefits,                            the patient was deemed in satisfactory condition to                            undergo the procedure.                           After obtaining informed consent, the endoscope was  passed under direct vision. Throughout the                            procedure, the patient's blood pressure, pulse, and                            oxygen saturations were monitored continuously. The                            GIF [id number] #[id number] was introduced through the                            mouth, and advanced to the second part of duodenum.                            The upper GI endoscopy was accomplished  without                            difficulty. The patient tolerated the procedure                            well. Scope In: Scope Out: Findings:                 The lower third of the esophagus was mildly                            tortuous.                           LA Grade A (one or more mucosal breaks less than 5                            mm, not extending between tops of 2 mucosal folds)                            esophagitis with no bleeding was found 34 to 35 cm                            from the incisors. GE junction at 35 cm. Biopsies                            were taken with a cold forceps for histology.                           A 1 cm hiatal hernia was present. Much better than                            before. Evidence of previous intact Nissen's                            fundoplication on retroflexed examination of the  cardia.                           Localized mild inflammation characterized by                            erythema and friability was found in the gastric                            antrum. Biopsies were taken with a cold forceps for                            histology.                           The examined duodenum was normal. Biopsies for                            histology were taken with a cold forceps for                            evaluation of celiac disease. Complications:            No immediate complications. Estimated Blood Loss:     Estimated blood loss: none. Impression:               - LA Grade A reflux esophagitis with no bleeding.                            Biopsied.                           - 1 cm hiatal hernia.                           - Gastritis. Biopsied.                           - S/P intact Nissen's fundoplication. Recommendation:           - Patient has a contact number available for                            emergencies. The signs and symptoms of potential                            delayed  complications were discussed with the                            patient. Return to normal activities tomorrow.                            Written discharge instructions were provided to the                            patient.                           -  Resume previous diet.                           - Continue present medications including Protonix                            40 mg p.o. daily.                           - Avoid nonsteroidals.                           - Await pathology results.                           - FU if still with problems.                           - The findings and recommendations were discussed                            with the patient's family. Lynann Bologna, MD 09/07/2023 3:31:11 PM This report has been signed electronically.

## 2023-09-07 NOTE — Op Note (Signed)
[facility] Patient Name: Patricia Leonard Procedure Date: 09/07/2023 2:38 PM MRN: [id number] Endoscopist: Lynann Bologna , MD, [id number] Age: 72 Referring MD:  Date of Birth: 1951/01/17 Gender: Female Account #: [id number] Procedure:                Colonoscopy Indications:              High risk colon cancer surveillance: Personal                            history of colonic polyps, Incidental - Iron                            deficiency anemia. FH CRC Medicines:                Monitored Anesthesia Care Procedure:                Pre-Anesthesia Assessment:                           - Prior to the procedure, a History and Physical                            was performed, and patient medications and                            allergies were reviewed. The patient's tolerance of                            previous anesthesia was also reviewed. The risks                            and benefits of the procedure and the sedation                            options and risks were discussed with the patient.                            All questions were answered, and informed consent                            was obtained. Prior Anticoagulants: The patient has                            taken no anticoagulant or antiplatelet agents. ASA                            Grade Assessment: II - A patient with mild systemic                            disease. After reviewing the risks and benefits,                            the patient was deemed in satisfactory condition to  undergo the procedure.                           After obtaining informed consent, the colonoscope                            was passed under direct vision. Throughout the                            procedure, the patient's blood pressure, pulse, and                            oxygen saturations were monitored continuously. The                            CF [id number] #[id number] was introduced  through the anus                            and advanced to the 1 cm into the ileum. The                            colonoscopy was somewhat difficult due to a                            tortuous colon. Successful completion of the                            procedure was aided by applying abdominal pressure.                            The patient tolerated the procedure well. The                            quality of the bowel preparation was good. The                            ileocecal valve, appendiceal orifice, and rectum                            were photographed. Scope In: 2:59:58 PM Scope Out: 3:20:07 PM Scope Withdrawal Time: 0 hours 12 minutes 45 seconds  Total Procedure Duration: 0 hours 20 minutes 9 seconds  Findings:                 Two sessile polyps were found in the proximal                            ascending colon and cecum. The polyps were 3 to 4                            mm in size. These polyps were removed with a cold                            snare. Resection and retrieval were  complete.                           Multiple medium-mouthed diverticula were found in                            the sigmoid colon and descending colon.                           Non-bleeding internal hemorrhoids were found during                            retroflexion. The hemorrhoids were moderate and                            Grade I (internal hemorrhoids that do not prolapse).                           The terminal ileum appeared normal.                           The exam was otherwise without abnormality on                            direct and retroflexion views. The colon was highly                            torturous. Complications:            No immediate complications. Estimated Blood Loss:     Estimated blood loss: none. Impression:               - Two 3 to 4 mm polyps in the proximal ascending                            colon and in the cecum, removed with a cold snare.                             Resected and retrieved.                           - Moderate left colonic diverticulosis.                           - Non-bleeding internal hemorrhoids.                           - The examined portion of the ileum was normal.                           - The examination was otherwise normal on direct                            and retroflexion views. Recommendation:           - Patient has a contact number available for  emergencies. The signs and symptoms of potential                            delayed complications were discussed with the                            patient. Return to normal activities tomorrow.                            Written discharge instructions were provided to the                            patient.                           - Resume previous diet.                           - Continue present medications.                           - Await pathology results.                           - Repeat colonoscopy is not recommended for                            surveillance.                           - The findings and recommendations were discussed                            with the patient's family. Lynann Bologna, MD 09/07/2023 3:27:02 PM This report has been signed electronically.

## 2023-09-07 NOTE — Progress Notes (Signed)
Pt's states no medical or surgical changes since previsit or office visit. 

## 2023-09-07 NOTE — Patient Instructions (Addendum)
Resume previous diet Continue present medications, including Protonix 40 mg daily Avoid use of  NSAIDS (Non-Steroidal anti-inflammatory drugs).  (These include, aspirin, aspirin-containing products(products containing salicylic acid like Pepto Bismol and Alka Seltzer), ibuprofen, advil, motrin, naproxen, aleve, goody powders, etc) Tylenol is ok to take as needed, see label for instructions. Await pathology results Follow up with Dr Chales Abrahams in the office if you still have problems  Handouts/information given for GERD, Esophagitis,  hiatal hernia, polyps, diverticulosis and hemorrhoids  YOU HAD AN ENDOSCOPIC PROCEDURE TODAY AT THE [facility]:   Refer to the procedure report that was given to you for any specific questions about what was found during the examination.  If the procedure report does not answer your questions, please call your gastroenterologist to clarify.  If you requested that your care partner not be given the details of your procedure findings, then the procedure report has been included in a sealed envelope for you to review at your convenience later.  YOU SHOULD EXPECT: Some feelings of bloating in the abdomen. Passage of more gas than usual.  Walking can help get rid of the air that was put into your GI tract during the procedure and reduce the bloating. If you had a lower endoscopy (such as a colonoscopy or flexible sigmoidoscopy) you may notice spotting of blood in your stool or on the toilet paper. If you underwent a bowel prep for your procedure, you may not have a normal bowel movement for a few days.  Please Note:  You might notice some irritation and congestion in your nose or some drainage.  This is from the oxygen used during your procedure.  There is no need for concern and it should clear up in a day or so.  SYMPTOMS TO REPORT IMMEDIATELY:  Following lower endoscopy (colonoscopy):  Excessive amounts of blood in the stool  Significant tenderness or worsening  of abdominal pains  Swelling of the abdomen that is new, acute  Fever of 100F or higher Following upper endoscopy (EGD)  Vomiting of blood or coffee ground material  New chest pain or pain under the shoulder blades  Painful or persistently difficult swallowing  New shortness of breath  Black, tarry-looking stools For urgent or emergent issues, a gastroenterologist can be reached at any hour by calling [phone]. Do not use MyChart messaging for urgent concerns.   DIET:  We do recommend a small meal at first, but then you may proceed to your regular diet.  Drink plenty of fluids but you should avoid alcoholic beverages for 24 hours.  ACTIVITY:  You should plan to take it easy for the rest of today and you should NOT DRIVE or use heavy machinery until tomorrow (because of the sedation medicines used during the test).    FOLLOW UP: Our staff will call the number listed on your records the next business day following your procedure.  We will call around 7:15- 8:00 am to check on you and address any questions or concerns that you may have regarding the information given to you following your procedure. If we do not reach you, we will leave a message.     If any biopsies were taken you will be contacted by phone or by letter within the next 1-3 weeks.  Please call us at [phone] if you have not heard about the biopsies in 3 weeks.   SIGNATURES/CONFIDENTIALITY: You and/or your care partner have signed paperwork which will be entered into your electronic medical record.  These signatures attest to the fact that that the information above on your After Visit Summary has been reviewed and is understood.  Full responsibility of the confidentiality of this discharge information lies with you and/or your care-partner.

## 2023-09-07 NOTE — Progress Notes (Unsigned)
1438 Robinul 0.1 mg IV given due large amount of secretions upon assessment.  MD made aware, vss 

## 2023-09-07 NOTE — Progress Notes (Unsigned)
Report given to PACU, vss 

## 2023-09-10 ENCOUNTER — Telehealth: Payer: Self-pay

## 2023-09-10 NOTE — Telephone Encounter (Signed)
  Follow up Call-     09/07/2023    2:15 PM  Call back number  Post procedure Call Back phone  # [phone]  Permission to leave phone message Yes     Patient questions:  Do you have a fever, pain , or abdominal swelling? No. Pain Score  0 *  Have you tolerated food without any problems? Yes.    Have you been able to return to your normal activities? Yes.    Do you have any questions about your discharge instructions: Diet   No. Medications  No. Follow up visit  No.  Do you have questions or concerns about your Care? No.  Actions: * If pain score is 4 or above: No action needed, pain <4.

## 2023-09-12 LAB — SURGICAL PATHOLOGY

## 2023-09-13 ENCOUNTER — Encounter: Payer: Self-pay | Admitting: Gastroenterology

## 2023-10-10 ENCOUNTER — Encounter: Payer: PPO | Admitting: Gastroenterology

## 2023-10-11 DIAGNOSIS — Z124 Encounter for screening for malignant neoplasm of cervix: Secondary | ICD-10-CM | POA: Diagnosis not present

## 2023-10-11 DIAGNOSIS — Z6834 Body mass index (BMI) 34.0-34.9, adult: Secondary | ICD-10-CM | POA: Diagnosis not present

## 2023-10-31 DIAGNOSIS — H2513 Age-related nuclear cataract, bilateral: Secondary | ICD-10-CM | POA: Diagnosis not present

## 2023-10-31 DIAGNOSIS — D1800 Hemangioma unspecified site: Secondary | ICD-10-CM | POA: Insufficient documentation

## 2023-10-31 DIAGNOSIS — L814 Other melanin hyperpigmentation: Secondary | ICD-10-CM | POA: Insufficient documentation

## 2023-10-31 DIAGNOSIS — H40053 Ocular hypertension, bilateral: Secondary | ICD-10-CM | POA: Diagnosis not present

## 2023-10-31 DIAGNOSIS — L821 Other seborrheic keratosis: Secondary | ICD-10-CM | POA: Insufficient documentation

## 2023-10-31 DIAGNOSIS — D225 Melanocytic nevi of trunk: Secondary | ICD-10-CM | POA: Insufficient documentation

## 2023-11-06 ENCOUNTER — Ambulatory Visit: Payer: PPO | Admitting: Family Medicine

## 2023-11-12 NOTE — Progress Notes (Unsigned)
 Subjective:  Patient ID: Patricia Leonard, female    DOB: 11/03/1950  Age: 73 y.o. MRN: [id number]  Chief Complaint  Patient presents with   Medical Management of Chronic Issues    HPI  The patient, with a history of gastroparesis, irritable bowel syndrome, and prediabetes, presents with multiple complaints. For the past three months, she has been experiencing increased fatigue, decreased stamina, and interrupted sleep. She also reports episodes of dizziness and palpitations, which she is unable to distinguish from gas-related discomfort due to her gastrointestinal conditions. These episodes are not daily but have occurred recently.  The patient also reports discomfort in the chest, which she is unsure if it is related to gas or a cardiac issue. The discomfort is located in the mid-chest and sometimes radiates to the spine. These episodes last about 10-20 minutes and are relieved by taking aspirin. The discomfort is rated as 7-8 on a scale of 10 and usually occurs while the patient is active, such as shopping.  In addition to the above, the patient has been experiencing right-sided abdominal pain, which she attributes to gas. She also reports bilateral knee pain, which is a new symptom for her. The pain is located in the middle of the kneecap and has not been evaluated or treated yet.  The patient also reports a postnasal drip that has been ongoing for weeks, causing nasal congestion and throat discomfort. She has been using an over-the-counter nasal spray occasionally for relief. Patient is currently not taking lisinopril 5 mg 1/2 tablet (2.5 mg).   IBS with Constipation: Patient has Bentyl 10 mg take 1 capsule 3 times daily PRN stomach cramps/pain and for gastroparesis has reglan 5 mg once daily as needed. She takes zofran as needed which helps.  Complaining of burping and gas pains. Patient has had nissen fundoplication 2023.   Aortic atherosclerosis/hyperlipidemia: currently on no statin  medicines due to development of muscle pain. Taking cholestoff.   Prediabetes is stable.  A1c is 5.9. Taking cinnamon.  Eating fairly healthy.  Exercising: walking.      11/13/2023    8:13 AM 08/01/2023    1:32 PM 04/30/2023    1:29 PM 04/11/2023    3:49 PM 03/19/2023    8:51 AM  Depression screen PHQ 2/9  Decreased Interest  1 1 1 1   Down, Depressed, Hopeless 0 1 0 1 1  PHQ - 2 Score 0 2 1 2 2   Altered sleeping 2 1 2 2 3   Tired, decreased energy 1 1 1 1 1   Change in appetite 0 1 2 1 1   Feeling bad or failure about yourself  0 0 0 0 0  Trouble concentrating 0 0 1 1 1   Moving slowly or fidgety/restless 0 0 0 0 0  Suicidal thoughts 0 0 0 0 0  PHQ-9 Score 3 5 7 7 8   Difficult doing work/chores Not difficult at all Somewhat difficult Not difficult at all Not difficult at all Not difficult at all        04/30/2023    1:29 PM  Fall Risk   Falls in the past year? 1  Number falls in past yr: 0  Injury with Fall? 1  Risk for fall due to : No Fall Risks  Follow up Falls evaluation completed    Patient Care Team: Blane Ohara, MD as PCP - General (Family Medicine) Lynann Bologna, MD as Consulting Physician (Gastroenterology) Marcelle Overlie, MD as Consulting Physician (Obstetrics and Gynecology) Diamond Nickel., MD  as Referring Physician (Cardiology)   Review of Systems  Constitutional:  Positive for fatigue. Negative for chills and fever.  HENT:  Positive for congestion, nosebleeds (otc afrin sporadically. Marland Kitchen) and postnasal drip. Negative for ear pain, rhinorrhea and sore throat.   Respiratory:  Negative for cough and shortness of breath.   Cardiovascular:  Positive for chest pain.  Gastrointestinal:  Positive for diarrhea. Negative for abdominal pain, constipation, nausea and vomiting.  Genitourinary:  Negative for dysuria and urgency.  Musculoskeletal:  Positive for arthralgias (knees BL.). Negative for back pain and myalgias.  Neurological:  Positive for light-headedness and  headaches (frontal sinus). Negative for dizziness, syncope and weakness.  Psychiatric/Behavioral:  Negative for dysphoric mood. The patient is not nervous/anxious.     Current Outpatient Medications on File Prior to Visit  Medication Sig Dispense Refill   aspirin EC 81 MG tablet Take 81 mg by mouth daily. Swallow whole.     Biotin 10 MG CAPS Take 10 mg by mouth daily.     calcium-vitamin D (OSCAL WITH D) 500-200 MG-UNIT TABS tablet Take by mouth.     clotrimazole-betamethasone (LOTRISONE) cream Apply 1 Application topically 2 (two) times daily as needed.     cyanocobalamin 1000 MCG tablet Take 1,000 mcg by mouth daily.     dicyclomine (BENTYL) 10 MG capsule Take 1 capsule (10 mg total) by mouth 3 (three) times daily before meals. As needed 90 capsule 8   ketoconazole (NIZORAL) 2 % cream Apply to lower abdominal rash (Patient taking differently: Apply 1 Application topically daily as needed for irritation. Apply to lower abdominal rash) 15 g 0   magnesium hydroxide (MILK OF MAGNESIA) 400 MG/5ML suspension Take 15 mLs by mouth daily as needed for mild constipation.     metoCLOPramide (REGLAN) 5 MG tablet Take 1 tablet (5 mg total) by mouth every 8 (eight) hours as needed for nausea or vomiting. 60 tablet 1   Omega-3 Fatty Acids (FISH OIL PO) Take 1 tablet by mouth daily at 6 (six) AM.     ondansetron (ZOFRAN) 4 MG tablet Take 1 tablet (4 mg total) by mouth every 8 (eight) hours as needed for nausea or vomiting. 20 tablet 4   pantoprazole (PROTONIX) 40 MG tablet Take 1 tablet (40 mg total) by mouth daily. 90 tablet 2   pyridoxine (B-6) 100 MG tablet Take 100 mg by mouth daily.     Vitamin D, Ergocalciferol, (DRISDOL) 1.25 MG (50000 UNIT) CAPS capsule Take 50,000 Units by mouth every 7 (seven) days.     No current facility-administered medications on file prior to visit.   Past Medical History:  Diagnosis Date   Allergy    Anemia    Aneurysm of ascending aorta without rupture (HCC)    Anxiety     Aortic atherosclerosis (HCC)    Arthritis    Cataract    Depression    Family history of colon cancer    Fibromyalgia    GERD (gastroesophageal reflux disease)    Hyperlipidemia    IBS (irritable bowel syndrome)    Moderate recurrent major depression (HCC) 08/05/2021   OSA (obstructive sleep apnea)    Rectal prolapse    Stomach ulcer    Tick bite    took antibiotics memorial day weekend 2021 on back   Past Surgical History:  Procedure Laterality Date   APPENDECTOMY     CESAREAN SECTION     CHOLECYSTECTOMY     COLONOSCOPY  04/18/2014   Diverticulosis.  ESOPHAGOGASTRODUODENOSCOPY  04/14/2016   Schatzkis ring. Hiatal hernia. Gastritis.    HERNIA REPAIR  11/07/2021   Hiatal   LIPOMA EXCISION     NISSEN FUNDOPLICATION  11/07/2021   [facility]   thermal ablation     TONSILLECTOMY      Family History  Problem Relation Age of Onset   Colon cancer Mother    Colon cancer Father    Liver cancer Father    CAD Father    Hypertension Sister    Hyperlipidemia Brother    Gout Brother    Cancer Maternal Aunt        "female cancer, not breast"   Cancer Paternal Aunt        "female cancer, not breast"   Diabetes Maternal Grandmother    Diabetes Paternal Grandmother    Heart attack Other    Cancer Other        Leiomyosarcome and Liver   Migraines Other    Colon polyps Neg Hx    Esophageal cancer Neg Hx    Pancreatic cancer Neg Hx    Rectal cancer Neg Hx    Stomach cancer Neg Hx    Social History   Socioeconomic History   Marital status: Widowed    Spouse name: Not on file   Number of children: 2   Years of education: Not on file   Highest education level: High school graduate  Occupational History   Not on file  Tobacco Use   Smoking status: Never   Smokeless tobacco: Never  Vaping Use   Vaping status: Never Used  Substance and Sexual Activity   Alcohol use: Never   Drug use: Never   Sexual activity: Not Currently  Other  Topics Concern   Not on file  Social History Narrative   Not on file   Social Drivers of Health   Financial Resource Strain: Medium Risk (04/10/2023)   Overall Financial Resource Strain (CARDIA)    Difficulty of Paying Living Expenses: Somewhat hard  Food Insecurity: No Food Insecurity (04/10/2023)   Hunger Vital Sign    Worried About Running Out of Food in the Last Year: Never true    Ran Out of Food in the Last Year: Never true  Transportation Needs: No Transportation Needs (04/10/2023)   PRAPARE - Administrator, Civil Service (Medical): No    Lack of Transportation (Non-Medical): No  Physical Activity: Insufficiently Active (04/10/2023)   Exercise Vital Sign    Days of Exercise per Week: 7 days    Minutes of Exercise per Session: 20 min  Stress: No Stress Concern Present (04/10/2023)   [employer] of Occupational Health - Occupational Stress Questionnaire    Feeling of Stress : Only a little  Social Connections: Moderately Integrated (04/10/2023)   Social Connection and Isolation Panel [NHANES]    Frequency of Communication with Friends and Family: Once a week    Frequency of Social Gatherings with Friends and Family: Three times a week    Attends Religious Services: More than 4 times per year    Active Member of Clubs or Organizations: Yes    Attends Banker Meetings: More than 4 times per year    Marital Status: Widowed    Objective:  BP 130/70   Pulse 88   Temp 98.2 F (36.8 C)   Ht 5\' 6"  (1.676 m)   Wt 209 lb (94.8 kg)   SpO2 99%   BMI 33.73  kg/m      11/13/2023    8:12 AM 09/07/2023    3:44 PM 09/07/2023    3:34 PM  BP/Weight  Systolic BP 130 112 102  Diastolic BP 70 60 60  Wt. (Lbs) 209    BMI 33.73 kg/m2      Physical Exam Vitals reviewed.  Constitutional:      General: She is not in acute distress.    Appearance: Normal appearance. She is normal weight.  HENT:     Right Ear: There is impacted cerumen.     Left Ear:  There is impacted cerumen.     Nose: Nose normal. No congestion or rhinorrhea.  Eyes:     Conjunctiva/sclera: Conjunctivae normal.  Neck:     Thyroid: No thyroid mass.     Vascular: No carotid bruit.  Cardiovascular:     Rate and Rhythm: Normal rate and regular rhythm.     Pulses: Normal pulses.     Heart sounds: Normal heart sounds. No murmur heard. Pulmonary:     Effort: Pulmonary effort is normal.     Breath sounds: Normal breath sounds.  Abdominal:     General: Bowel sounds are normal.     Palpations: Abdomen is soft. There is no mass.     Tenderness: There is no abdominal tenderness.  Musculoskeletal:        General: Normal range of motion.  Lymphadenopathy:     Cervical: No cervical adenopathy.  Skin:    General: Skin is warm and dry.  Neurological:     Mental Status: She is alert and oriented to person, place, and time.     Cranial Nerves: No cranial nerve deficit.  Psychiatric:        Mood and Affect: Mood normal.        Behavior: Behavior normal.     Diabetic Foot Exam - Simple   No data filed      Lab Results  Component Value Date   WBC 3.6 11/13/2023   HGB 13.2 11/13/2023   HCT 39.9 11/13/2023   PLT 204 11/13/2023   GLUCOSE 91 11/13/2023   CHOL 220 (H) 11/13/2023   TRIG 132 11/13/2023   HDL 56 11/13/2023   LDLCALC 141 (H) 11/13/2023   ALT 14 11/13/2023   AST 19 11/13/2023   NA 143 11/13/2023   K 4.6 11/13/2023   CL 105 11/13/2023   CREATININE 0.83 11/13/2023   BUN 13 11/13/2023   CO2 24 11/13/2023   TSH 2.910 08/05/2021   HGBA1C 5.9 (H) 11/13/2023      Assessment & Plan:    Essential hypertension, benign Assessment & Plan: Blood pressure well-controlled.   Not taking medicine.  Orders: -     CBC with Differential/Platelet -     Comprehensive metabolic panel  OSA (obstructive sleep apnea) Assessment & Plan: Mild. Recommended wt loss previously.   Prediabetes Assessment & Plan: Stable.  Continue to work on [employer] and  exercise.  Orders: -     Hemoglobin A1c  Mixed hyperlipidemia Assessment & Plan: Not at goal.  Recommended start Zetia 10 mg daily. Continue to work on eating a healthy diet and exercise.    Orders: -     Lipid panel  Other chest pain Assessment & Plan: Order EKG. EKG SINUS BRADYCARDIA. MILD.  NO CARDIAC SOURCE OF PAIN. KEEP CARDIOLOGY APPT COMING UP.  LIKELY GI RELATED.  CONTINUE REGLAN AND PROTONIX.    Orders: -     EKG 12-Lead  Acute non-recurrent maxillary sinusitis Assessment & Plan: Recommend stopping the over-the-counter nasal spray you are using, as it may cause more rebound congestion. We will prescribe an antibiotic to help clear the infection. Zpack sent.   Orders: -     Azithromycin; Take 2 tablets on day 1, then 1 tablet daily on days 2 through 5  Dispense: 6 tablet; Refill: 0  Chronic pain of both knees Assessment & Plan: Suggest using over-the-counter Tylenol as directed on the bottle and applying ice to your knees.   Impacted cerumen, bilateral Assessment & Plan: Recommend using Debrox drops, 5 drops in each ear twice daily for one week.      Meds ordered this encounter  Medications   azithromycin (ZITHROMAX) 250 MG tablet    Sig: Take 2 tablets on day 1, then 1 tablet daily on days 2 through 5    Dispense:  6 tablet    Refill:  0    Orders Placed This Encounter  Procedures   CBC with Differential/Platelet   Comprehensive metabolic panel   Hemoglobin A1c   Lipid panel   EKG 12-Lead     Follow-up: Return in about 1 week (around 11/20/2023) for Nurse visit ear irrigation, 3 month fasting visit..  Total time spent on today's visit was 45 minutes, including both face-to-face time and nonface-to-face time personally spent on review of chart (labs and imaging), discussing labs and goals, discussing further work-up, treatment options, referrals to specialist if needed, reviewing outside records of pertinent, answering patient's questions, and  coordinating care.   I,Marla I Leal-Borjas,acting as a scribe for Blane Ohara, MD.,have documented all relevant documentation on the behalf of Blane Ohara, MD,as directed by  Blane Ohara, MD while in the presence of Blane Ohara, MD.   An After Visit Summary was printed and given to the patient.  I attest that I have reviewed this visit and agree with the plan scribed by my staff.   Blane Ohara, MD [facility] [phone]

## 2023-11-13 ENCOUNTER — Other Ambulatory Visit: Payer: Self-pay

## 2023-11-13 ENCOUNTER — Ambulatory Visit: Payer: PPO | Admitting: Family Medicine

## 2023-11-13 ENCOUNTER — Encounter: Payer: Self-pay | Admitting: Family Medicine

## 2023-11-13 VITALS — BP 130/70 | HR 88 | Temp 98.2°F | Ht 66.0 in | Wt 209.0 lb

## 2023-11-13 DIAGNOSIS — H6123 Impacted cerumen, bilateral: Secondary | ICD-10-CM

## 2023-11-13 DIAGNOSIS — M25562 Pain in left knee: Secondary | ICD-10-CM

## 2023-11-13 DIAGNOSIS — G4733 Obstructive sleep apnea (adult) (pediatric): Secondary | ICD-10-CM

## 2023-11-13 DIAGNOSIS — E782 Mixed hyperlipidemia: Secondary | ICD-10-CM | POA: Diagnosis not present

## 2023-11-13 DIAGNOSIS — G8929 Other chronic pain: Secondary | ICD-10-CM

## 2023-11-13 DIAGNOSIS — R079 Chest pain, unspecified: Secondary | ICD-10-CM | POA: Diagnosis not present

## 2023-11-13 DIAGNOSIS — M25561 Pain in right knee: Secondary | ICD-10-CM | POA: Diagnosis not present

## 2023-11-13 DIAGNOSIS — R0789 Other chest pain: Secondary | ICD-10-CM | POA: Diagnosis not present

## 2023-11-13 DIAGNOSIS — J0100 Acute maxillary sinusitis, unspecified: Secondary | ICD-10-CM

## 2023-11-13 DIAGNOSIS — R7303 Prediabetes: Secondary | ICD-10-CM

## 2023-11-13 DIAGNOSIS — J329 Chronic sinusitis, unspecified: Secondary | ICD-10-CM | POA: Insufficient documentation

## 2023-11-13 DIAGNOSIS — I10 Essential (primary) hypertension: Secondary | ICD-10-CM | POA: Diagnosis not present

## 2023-11-13 MED ORDER — AZITHROMYCIN 250 MG PO TABS: ORAL_TABLET | ORAL | 0 refills | Status: AC

## 2023-11-13 NOTE — Patient Instructions (Addendum)
 VISIT SUMMARY:  During today's visit, we discussed several of your ongoing health concerns, including chest pain, sinus issues, knee pain, and earwax buildup. We also reviewed your general health maintenance and provided recommendations for a healthier lifestyle.  YOUR PLAN:  -CHEST PAIN: Your chest pain could be related to gas or a heart issue. We will perform an EKG and check your troponin levels today to rule out any cardiac problems. Please continue with your scheduled cardiology appointment next month.  -SINUSITIS: Sinusitis is an inflammation of the sinuses that can cause postnasal drip, congestion, and discomfort. We recommend stopping the over-the-counter nasal spray you are using, as it may cause more rebound congestion. We will prescribe an antibiotic to help clear the infection. Zpack sent.   -BILATERAL KNEE AND HIP PAIN: Your knee and hip pain may be related to osteopenia, which is a condition where bone density is lower than normal. We suggest using over-the-counter Tylenol as directed on the bottle and applying ice to your knees. If the pain does not improve, we may need to do x-rays.  -BILATERAL CERUMEN IMPACTION: Cerumen impaction means you have a buildup of earwax. We recommend using Debrox drops, 5 drops in each ear twice daily for one week. After that, please schedule a return visit for ear irrigation.  -GENERAL HEALTH MAINTENANCE: We recommend a bone density scan to check your bone health. Additionally, we encourage you to adopt a healthier diet and increase your physical activity.  INSTRUCTIONS:  Please follow up with the cardiology appointment next month as scheduled. Use Debrox drops for one week and then schedule a return visit for ear irrigation. If your knee pain does not improve with Tylenol and ice, consider getting x-rays. Lastly, schedule a bone density scan at your local medical center.

## 2023-11-13 NOTE — Assessment & Plan Note (Signed)
 Not at goal.  Recommended start Zetia 10 mg daily. Continue to work on eating a healthy diet and exercise.

## 2023-11-14 ENCOUNTER — Encounter: Payer: Self-pay | Admitting: Family Medicine

## 2023-11-14 LAB — COMPREHENSIVE METABOLIC PANEL
ALT: 14 [IU]/L (ref 0–32)
AST: 19 [IU]/L (ref 0–40)
Albumin: 4.1 g/dL (ref 3.8–4.8)
Alkaline Phosphatase: 108 [IU]/L (ref 44–121)
BUN/Creatinine Ratio: 16 (ref 12–28)
BUN: 13 mg/dL (ref 8–27)
Bilirubin Total: 0.5 mg/dL (ref 0.0–1.2)
CO2: 24 mmol/L (ref 20–29)
Calcium: 9.2 mg/dL (ref 8.7–10.3)
Chloride: 105 mmol/L (ref 96–106)
Creatinine, Ser: 0.83 mg/dL (ref 0.57–1.00)
Globulin, Total: 2.3 g/dL (ref 1.5–4.5)
Glucose: 91 mg/dL (ref 70–99)
Potassium: 4.6 mmol/L (ref 3.5–5.2)
Sodium: 143 mmol/L (ref 134–144)
Total Protein: 6.4 g/dL (ref 6.0–8.5)
eGFR: 74 mL/min/{1.73_m2} (ref >59)

## 2023-11-14 LAB — LIPID PANEL
Chol/HDL Ratio: 3.9 {ratio} (ref 0.0–4.4)
Cholesterol, Total: 220 mg/dL — ABNORMAL HIGH (ref 100–199)
HDL: 56 mg/dL (ref >39)
LDL Chol Calc (NIH): 141 mg/dL — ABNORMAL HIGH (ref 0–99)
Triglycerides: 132 mg/dL (ref 0–149)
VLDL Cholesterol Cal: 23 mg/dL (ref 5–40)

## 2023-11-14 LAB — CBC WITH DIFFERENTIAL/PLATELET
Basophils Absolute: 0 10*3/uL (ref 0.0–0.2)
Basos: 1 %
EOS (ABSOLUTE): 0.1 10*3/uL (ref 0.0–0.4)
Eos: 3 %
Hematocrit: 39.9 % (ref 34.0–46.6)
Hemoglobin: 13.2 g/dL (ref 11.1–15.9)
Immature Grans (Abs): 0 10*3/uL (ref 0.0–0.1)
Immature Granulocytes: 0 %
Lymphocytes Absolute: 1.4 10*3/uL (ref 0.7–3.1)
Lymphs: 38 %
MCH: 30 pg (ref 26.6–33.0)
MCHC: 33.1 g/dL (ref 31.5–35.7)
MCV: 91 fL (ref 79–97)
Monocytes Absolute: 0.2 10*3/uL (ref 0.1–0.9)
Monocytes: 7 %
Neutrophils Absolute: 1.9 10*3/uL (ref 1.4–7.0)
Neutrophils: 51 %
Platelets: 204 10*3/uL (ref 150–450)
RBC: 4.4 x10E6/uL (ref 3.77–5.28)
RDW: 12.9 % (ref 11.7–15.4)
WBC: 3.6 10*3/uL (ref 3.4–10.8)

## 2023-11-14 LAB — HEMOGLOBIN A1C
Est. average glucose Bld gHb Est-mCnc: 123 mg/dL
Hgb A1c MFr Bld: 5.9 % — ABNORMAL HIGH (ref 4.8–5.6)

## 2023-11-14 NOTE — Assessment & Plan Note (Signed)
Stable.  Continue to work on [employer] and exercise.

## 2023-11-14 NOTE — Assessment & Plan Note (Addendum)
 Recommend stopping the over-the-counter nasal spray you are using, as it may cause more rebound congestion. We will prescribe an antibiotic to help clear the infection. Zpack sent.

## 2023-11-14 NOTE — Assessment & Plan Note (Deleted)
 The current medical regimen is effective;  continue present plan and medications.

## 2023-11-14 NOTE — Assessment & Plan Note (Signed)
 Blood pressure well-controlled.   Not taking medicine.

## 2023-11-14 NOTE — Assessment & Plan Note (Addendum)
 Order EKG. EKG SINUS BRADYCARDIA. MILD.  NO CARDIAC SOURCE OF PAIN. KEEP CARDIOLOGY APPT COMING UP.  LIKELY GI RELATED.  CONTINUE REGLAN AND PROTONIX.

## 2023-11-17 DIAGNOSIS — H6123 Impacted cerumen, bilateral: Secondary | ICD-10-CM | POA: Insufficient documentation

## 2023-11-17 NOTE — Assessment & Plan Note (Signed)
 Recommend using Debrox drops, 5 drops in each ear twice daily for one week.

## 2023-11-17 NOTE — Assessment & Plan Note (Signed)
 Suggest using over-the-counter Tylenol as directed on the bottle and applying ice to your knees.

## 2023-11-18 NOTE — Assessment & Plan Note (Signed)
 Mild. Recommended wt loss previously.

## 2023-11-28 DIAGNOSIS — I10 Essential (primary) hypertension: Secondary | ICD-10-CM | POA: Diagnosis not present

## 2023-11-28 DIAGNOSIS — Z86711 Personal history of pulmonary embolism: Secondary | ICD-10-CM | POA: Diagnosis not present

## 2023-11-28 DIAGNOSIS — I700 Atherosclerosis of aorta: Secondary | ICD-10-CM | POA: Diagnosis not present

## 2023-11-28 DIAGNOSIS — R0602 Shortness of breath: Secondary | ICD-10-CM | POA: Insufficient documentation

## 2023-11-28 DIAGNOSIS — E782 Mixed hyperlipidemia: Secondary | ICD-10-CM | POA: Diagnosis not present

## 2023-11-28 DIAGNOSIS — E785 Hyperlipidemia, unspecified: Secondary | ICD-10-CM | POA: Diagnosis not present

## 2023-11-28 DIAGNOSIS — M791 Myalgia, unspecified site: Secondary | ICD-10-CM | POA: Diagnosis not present

## 2023-11-28 DIAGNOSIS — E66811 Obesity, class 1: Secondary | ICD-10-CM | POA: Diagnosis not present

## 2023-11-28 DIAGNOSIS — R001 Bradycardia, unspecified: Secondary | ICD-10-CM | POA: Diagnosis not present

## 2023-11-28 DIAGNOSIS — I358 Other nonrheumatic aortic valve disorders: Secondary | ICD-10-CM | POA: Diagnosis not present

## 2023-11-28 DIAGNOSIS — E6609 Other obesity due to excess calories: Secondary | ICD-10-CM | POA: Diagnosis not present

## 2023-11-28 DIAGNOSIS — I7121 Aneurysm of the ascending aorta, without rupture: Secondary | ICD-10-CM | POA: Diagnosis not present

## 2023-12-04 DIAGNOSIS — M25561 Pain in right knee: Secondary | ICD-10-CM | POA: Insufficient documentation

## 2023-12-06 DIAGNOSIS — M1711 Unilateral primary osteoarthritis, right knee: Secondary | ICD-10-CM | POA: Insufficient documentation

## 2023-12-12 DIAGNOSIS — L821 Other seborrheic keratosis: Secondary | ICD-10-CM | POA: Diagnosis not present

## 2023-12-12 DIAGNOSIS — L728 Other follicular cysts of the skin and subcutaneous tissue: Secondary | ICD-10-CM | POA: Diagnosis not present

## 2023-12-12 DIAGNOSIS — L2989 Other pruritus: Secondary | ICD-10-CM | POA: Diagnosis not present

## 2023-12-13 DIAGNOSIS — I493 Ventricular premature depolarization: Secondary | ICD-10-CM | POA: Diagnosis not present

## 2023-12-13 DIAGNOSIS — I471 Supraventricular tachycardia, unspecified: Secondary | ICD-10-CM | POA: Diagnosis not present

## 2023-12-13 DIAGNOSIS — I491 Atrial premature depolarization: Secondary | ICD-10-CM | POA: Diagnosis not present

## 2023-12-13 DIAGNOSIS — R001 Bradycardia, unspecified: Secondary | ICD-10-CM | POA: Diagnosis not present

## 2023-12-26 DIAGNOSIS — I700 Atherosclerosis of aorta: Secondary | ICD-10-CM | POA: Diagnosis not present

## 2023-12-26 DIAGNOSIS — I7121 Aneurysm of the ascending aorta, without rupture: Secondary | ICD-10-CM | POA: Diagnosis not present

## 2024-01-04 DIAGNOSIS — I491 Atrial premature depolarization: Secondary | ICD-10-CM | POA: Diagnosis not present

## 2024-01-04 DIAGNOSIS — I493 Ventricular premature depolarization: Secondary | ICD-10-CM | POA: Diagnosis not present

## 2024-01-04 DIAGNOSIS — R001 Bradycardia, unspecified: Secondary | ICD-10-CM | POA: Diagnosis not present

## 2024-01-04 DIAGNOSIS — R0602 Shortness of breath: Secondary | ICD-10-CM | POA: Diagnosis not present

## 2024-01-04 DIAGNOSIS — I471 Supraventricular tachycardia, unspecified: Secondary | ICD-10-CM | POA: Diagnosis not present

## 2024-02-11 NOTE — Progress Notes (Signed)
 Subjective:  Patient ID: Patricia Leonard, female    DOB: 17-Apr-1951  Age: 73 y.o. MRN: [id number]  Chief Complaint  Patient presents with   Medical Management of Chronic Issues    HPI: Patient is currently not taking lisinopril 5 mg 1/2 tablet (2.5 mg).   IBS with Constipation: Patient has Bentyl  10 mg take 1 capsule 3 times daily PRN stomach cramps/pain and for gastroparesis has reglan  5 mg once daily as needed. She takes zofran  as needed which helps.    Aortic atherosclerosis/hyperlipidemia: currently on no statin medicines due to development of muscle pain. Taking cholestoff.   SVT: On metoprolol xl 25 mg 1/2 daily. Has helped with SVT, but bp lower and pulse was low (low 50s.) she feels tired. Has an appt on 02/28/2024. Had OSA   Prediabetes is stable.  A1c is 5.9. Taking cinnamon.  Eating fairly healthy.  Exercising: walking.      02/12/2024    8:42 AM 11/13/2023    8:13 AM 08/01/2023    1:32 PM 04/30/2023    1:29 PM 04/11/2023    3:49 PM  Depression screen PHQ 2/9  Decreased Interest 0  1 1 1   Down, Depressed, Hopeless 0 0 1 0 1  PHQ - 2 Score 0 0 2 1 2   Altered sleeping 1 2 1 2 2   Tired, decreased energy 1 1 1 1 1   Change in appetite 0 0 1 2 1   Feeling bad or failure about yourself  0 0 0 0 0  Trouble concentrating 0 0 0 1 1  Moving slowly or fidgety/restless 0 0 0 0 0  Suicidal thoughts 0 0 0 0 0  PHQ-9 Score 2 3 5 7 7   Difficult doing work/chores Not difficult at all Not difficult at all Somewhat difficult Not difficult at all Not difficult at all        02/12/2024    8:42 AM  Fall Risk   Falls in the past year? 1  Number falls in past yr: 0  Injury with Fall? 0  Risk for fall due to : No Fall Risks  Follow up Falls evaluation completed    Patient Care Team: Mercy Stall, MD as PCP - General (Family Medicine) Lajuan Pila, MD as Consulting Physician (Gastroenterology) Thurman Flores, MD as Consulting Physician (Obstetrics and Gynecology) Mahlon Schwab., MD as Referring Physician (Cardiology)   Review of Systems  Constitutional:  Negative for chills, fatigue and fever.  HENT:  Negative for congestion, ear pain, rhinorrhea and sore throat.   Respiratory:  Negative for cough and shortness of breath.   Cardiovascular:  Negative for chest pain.  Gastrointestinal:  Negative for abdominal pain, constipation, diarrhea, nausea and vomiting.  Genitourinary:  Negative for dysuria and urgency.  Musculoskeletal:  Negative for back pain and myalgias.  Neurological:  Negative for dizziness, weakness, light-headedness and headaches.  Psychiatric/Behavioral:  Negative for dysphoric mood. The patient is not nervous/anxious.     Current Outpatient Medications on File Prior to Visit  Medication Sig Dispense Refill   aspirin EC 81 MG tablet Take 81 mg by mouth daily. Swallow whole.     Biotin 10 MG CAPS Take 10 mg by mouth daily.     calcium -vitamin D  (OSCAL WITH D) 500-200 MG-UNIT TABS tablet Take by mouth.     clotrimazole-betamethasone (LOTRISONE) cream Apply 1 Application topically 2 (two) times daily as needed.     cyanocobalamin  1000 MCG tablet Take 1,000 mcg by mouth daily.  dicyclomine  (BENTYL ) 10 MG capsule Take 1 capsule (10 mg total) by mouth 3 (three) times daily before meals. As needed 90 capsule 8   ketoconazole  (NIZORAL ) 2 % cream Apply to lower abdominal rash (Patient taking differently: Apply 1 Application topically daily as needed for irritation. Apply to lower abdominal rash) 15 g 0   magnesium hydroxide (MILK OF MAGNESIA) 400 MG/5ML suspension Take 15 mLs by mouth daily as needed for mild constipation.     metoCLOPramide  (REGLAN ) 5 MG tablet Take 1 tablet (5 mg total) by mouth every 8 (eight) hours as needed for nausea or vomiting. 60 tablet 1   Omega-3 Fatty Acids (FISH OIL PO) Take 1 tablet by mouth daily at 6 (six) AM.     pantoprazole  (PROTONIX ) 40 MG tablet Take 1 tablet (40 mg total) by mouth daily. 90 tablet 2   pyridoxine  (B-6) 100 MG tablet Take 100 mg by mouth daily.     Vitamin D , Ergocalciferol , (DRISDOL ) 1.25 MG (50000 UNIT) CAPS capsule Take 50,000 Units by mouth every 7 (seven) days.     No current facility-administered medications on file prior to visit.   Past Medical History:  Diagnosis Date   Allergy    Anemia    Aneurysm of ascending aorta without rupture (HCC)    Anxiety    Aortic atherosclerosis (HCC)    Arthritis    Cataract    Depression    Family history of colon cancer    Fibromyalgia    GERD (gastroesophageal reflux disease)    Hyperlipidemia    IBS (irritable bowel syndrome)    Moderate recurrent major depression (HCC) 08/05/2021   OSA (obstructive sleep apnea)    Rectal prolapse    Stomach ulcer    Tick bite    took antibiotics memorial day weekend 2021 on back   Past Surgical History:  Procedure Laterality Date   APPENDECTOMY     CESAREAN SECTION     CHOLECYSTECTOMY     COLONOSCOPY  04/18/2014   Diverticulosis.    ESOPHAGOGASTRODUODENOSCOPY  04/14/2016   Schatzkis ring. Hiatal hernia. Gastritis.    HERNIA REPAIR  11/07/2021   Hiatal   LIPOMA EXCISION     NISSEN FUNDOPLICATION  11/07/2021   [facility]   thermal ablation     TONSILLECTOMY      Family History  Problem Relation Age of Onset   Colon cancer Mother    Colon cancer Father    Liver cancer Father    CAD Father    Hypertension Sister    Hyperlipidemia Brother    Gout Brother    Cancer Maternal Aunt        "female cancer, not breast"   Cancer Paternal Aunt        "female cancer, not breast"   Diabetes Maternal Grandmother    Diabetes Paternal Grandmother    Heart attack Other    Cancer Other        Leiomyosarcome and Liver   Migraines Other    Colon polyps Neg Hx    Esophageal cancer Neg Hx    Pancreatic cancer Neg Hx    Rectal cancer Neg Hx    Stomach cancer Neg Hx    Social History   Socioeconomic History   Marital status: Widowed    Spouse name: Not  on file   Number of children: 2   Years of education: Not on file   Highest education level: High school graduate  Occupational History   Not on file  Tobacco Use   Smoking status: Never   Smokeless tobacco: Never  Vaping Use   Vaping status: Never Used  Substance and Sexual Activity   Alcohol use: Never   Drug use: Never   Sexual activity: Not Currently  Other Topics Concern   Not on file  Social History Narrative   Not on file   Social Drivers of Health   Financial Resource Strain: Medium Risk (04/10/2023)   Overall Financial Resource Strain (CARDIA)    Difficulty of Paying Living Expenses: Somewhat hard  Food Insecurity: No Food Insecurity (04/10/2023)   Hunger Vital Sign    Worried About Running Out of Food in the Last Year: Never true    Ran Out of Food in the Last Year: Never true  Transportation Needs: No Transportation Needs (04/10/2023)   PRAPARE - Administrator, Civil Service (Medical): No    Lack of Transportation (Non-Medical): No  Physical Activity: Insufficiently Active (04/10/2023)   Exercise Vital Sign    Days of Exercise per Week: 7 days    Minutes of Exercise per Session: 20 min  Stress: No Stress Concern Present (04/10/2023)   [employer] of Occupational Health - Occupational Stress Questionnaire    Feeling of Stress : Only a little  Social Connections: Moderately Integrated (04/10/2023)   Social Connection and Isolation Panel [NHANES]    Frequency of Communication with Friends and Family: Once a week    Frequency of Social Gatherings with Friends and Family: Three times a week    Attends Religious Services: More than 4 times per year    Active Member of Clubs or Organizations: Yes    Attends Banker Meetings: More than 4 times per year    Marital Status: Widowed    Objective:  BP 124/78   Pulse (!) 52   Temp 98.1 F (36.7 C)   Ht 5\' 6"  (1.676 m)   Wt 209 lb (94.8 kg)   SpO2 97%   BMI 33.73 kg/m      02/12/2024     8:47 AM 11/13/2023    8:12 AM 09/07/2023    3:44 PM  BP/Weight  Systolic BP 124 130 112  Diastolic BP 78 70 60  Wt. (Lbs) 209 209   BMI 33.73 kg/m2 33.73 kg/m2     Physical Exam Vitals reviewed.  Constitutional:      Appearance: Normal appearance. She is obese.  Neck:     Vascular: No carotid bruit.  Cardiovascular:     Rate and Rhythm: Normal rate and regular rhythm.     Heart sounds: Normal heart sounds.  Pulmonary:     Effort: Pulmonary effort is normal. No respiratory distress.     Breath sounds: Normal breath sounds.  Abdominal:     General: Abdomen is flat. Bowel sounds are normal.     Palpations: Abdomen is soft.     Tenderness: There is no abdominal tenderness.  Neurological:     Mental Status: She is alert and oriented to person, place, and time.  Psychiatric:        Mood and Affect: Mood normal.        Behavior: Behavior normal.     Diabetic Foot Exam - Simple   No data filed      Lab Results  Component Value Date   WBC 4.4 02/12/2024   HGB 14.1 02/12/2024   HCT 43.6 02/12/2024   PLT 204 02/12/2024  GLUCOSE 89 02/12/2024   CHOL 196 02/12/2024   TRIG 108 02/12/2024   HDL 55 02/12/2024   LDLCALC 122 (H) 02/12/2024   ALT 14 02/12/2024   AST 17 02/12/2024   NA 141 02/12/2024   K 4.5 02/12/2024   CL 103 02/12/2024   CREATININE 0.79 02/12/2024   BUN 12 02/12/2024   CO2 22 02/12/2024   TSH 2.120 02/12/2024   HGBA1C 5.9 (H) 02/12/2024      Assessment & Plan:  Essential hypertension, benign Assessment & Plan: Blood pressure well-controlled.   Not taking medicine.  Orders: -     CBC with Differential/Platelet -     Comprehensive metabolic panel with GFR  Irritable bowel syndrome with constipation Assessment & Plan: Continue bentyl  before meals and before bed.    Prediabetes Assessment & Plan: Stable.  Continue to work on [employer] and exercise.  Orders: -     Hemoglobin A1c  Mixed hyperlipidemia Assessment & Plan: Not at  goal.   Recommended zetia , but patient refused.  Continue to work on eating a healthy diet and exercise.  Labs drawn today.   Orders: -     Lipid panel  SVT (supraventricular tachycardia) (HCC) Assessment & Plan: Continue metoprolol xl 25 mg 1/2 daily. Pulse low 50s and patient feeling fatigued.  Check labs Keep appt 02/28/2024 with cardiology.  Orders: -     T4, free -     TSH -     Magnesium  History of iron deficiency anemia Assessment & Plan: Check labs  Orders: -     Iron, TIBC and Ferritin Panel  BMI 33.0-33.9,adult Assessment & Plan: Recommend continue to work on eating healthy diet and exercise.    Myalgia due to statin Assessment & Plan: Intolerant to statins.   Other orders -     Ondansetron  HCl; Take 1 tablet (4 mg total) by mouth every 8 (eight) hours as needed for nausea or vomiting.  Dispense: 20 tablet; Refill: 4     Meds ordered this encounter  Medications   ondansetron  (ZOFRAN ) 4 MG tablet    Sig: Take 1 tablet (4 mg total) by mouth every 8 (eight) hours as needed for nausea or vomiting.    Dispense:  20 tablet    Refill:  4    Orders Placed This Encounter  Procedures   CBC with Differential/Platelet   Comprehensive metabolic panel with GFR   Hemoglobin A1c   Lipid panel   T4, free   TSH   Magnesium   Iron, TIBC and Ferritin Panel     Follow-up: Return in about 3 months (around 05/14/2024) for chronic follow up.   I,Marla I Leal-Borjas,acting as a scribe for Mercy Stall, MD.,have documented all relevant documentation on the behalf of Mercy Stall, MD,as directed by  Mercy Stall, MD while in the presence of Mercy Stall, MD.   An After Visit Summary was printed and given to the patient.  I attest that I have reviewed this visit and agree with the plan scribed by my staff.   Mercy Stall, MD [facility] [phone]

## 2024-02-12 ENCOUNTER — Ambulatory Visit (INDEPENDENT_AMBULATORY_CARE_PROVIDER_SITE_OTHER): Payer: PPO | Admitting: Family Medicine

## 2024-02-12 ENCOUNTER — Encounter: Payer: Self-pay | Admitting: Family Medicine

## 2024-02-12 VITALS — BP 124/78 | HR 52 | Temp 98.1°F | Ht 66.0 in | Wt 209.0 lb

## 2024-02-12 DIAGNOSIS — Z6833 Body mass index (BMI) 33.0-33.9, adult: Secondary | ICD-10-CM

## 2024-02-12 DIAGNOSIS — I471 Supraventricular tachycardia, unspecified: Secondary | ICD-10-CM | POA: Diagnosis not present

## 2024-02-12 DIAGNOSIS — K581 Irritable bowel syndrome with constipation: Secondary | ICD-10-CM | POA: Diagnosis not present

## 2024-02-12 DIAGNOSIS — Z862 Personal history of diseases of the blood and blood-forming organs and certain disorders involving the immune mechanism: Secondary | ICD-10-CM | POA: Diagnosis not present

## 2024-02-12 DIAGNOSIS — I10 Essential (primary) hypertension: Secondary | ICD-10-CM | POA: Diagnosis not present

## 2024-02-12 DIAGNOSIS — R7303 Prediabetes: Secondary | ICD-10-CM

## 2024-02-12 DIAGNOSIS — M791 Myalgia, unspecified site: Secondary | ICD-10-CM | POA: Diagnosis not present

## 2024-02-12 DIAGNOSIS — T466X5A Adverse effect of antihyperlipidemic and antiarteriosclerotic drugs, initial encounter: Secondary | ICD-10-CM | POA: Diagnosis not present

## 2024-02-12 DIAGNOSIS — E782 Mixed hyperlipidemia: Secondary | ICD-10-CM | POA: Diagnosis not present

## 2024-02-12 MED ORDER — ONDANSETRON HCL 4 MG PO TABS: 4.0000 mg | ORAL_TABLET | Freq: Three times a day (TID) | ORAL | 4 refills | Status: AC | PRN

## 2024-02-12 NOTE — Assessment & Plan Note (Signed)
Continue bentyl before meals and before bed.

## 2024-02-12 NOTE — Assessment & Plan Note (Addendum)
 Not at goal.   Recommended zetia , but patient refused.  Continue to work on eating a healthy diet and exercise.  Labs drawn today.

## 2024-02-12 NOTE — Assessment & Plan Note (Signed)
Stable.  Continue to work on [employer] and exercise.

## 2024-02-12 NOTE — Assessment & Plan Note (Signed)
 Check labs

## 2024-02-12 NOTE — Assessment & Plan Note (Addendum)
 Continue metoprolol xl 25 mg 1/2 daily. Pulse low 50s and patient feeling fatigued.  Check labs Keep appt 02/28/2024 with cardiology.

## 2024-02-12 NOTE — Assessment & Plan Note (Signed)
 Blood pressure well-controlled.   Not taking medicine.

## 2024-02-13 ENCOUNTER — Ambulatory Visit: Payer: Self-pay | Admitting: Family Medicine

## 2024-02-13 LAB — CBC WITH DIFFERENTIAL/PLATELET
Basophils Absolute: 0 10*3/uL (ref 0.0–0.2)
Basos: 1 %
EOS (ABSOLUTE): 0.1 10*3/uL (ref 0.0–0.4)
Eos: 2 %
Hematocrit: 43.6 % (ref 34.0–46.6)
Hemoglobin: 14.1 g/dL (ref 11.1–15.9)
Immature Grans (Abs): 0 10*3/uL (ref 0.0–0.1)
Immature Granulocytes: 0 %
Lymphocytes Absolute: 1.2 10*3/uL (ref 0.7–3.1)
Lymphs: 26 %
MCH: 29.9 pg (ref 26.6–33.0)
MCHC: 32.3 g/dL (ref 31.5–35.7)
MCV: 93 fL (ref 79–97)
Monocytes Absolute: 0.3 10*3/uL (ref 0.1–0.9)
Monocytes: 6 %
Neutrophils Absolute: 2.8 10*3/uL (ref 1.4–7.0)
Neutrophils: 65 %
Platelets: 204 10*3/uL (ref 150–450)
RBC: 4.71 x10E6/uL (ref 3.77–5.28)
RDW: 13.5 % (ref 11.7–15.4)
WBC: 4.4 10*3/uL (ref 3.4–10.8)

## 2024-02-13 LAB — COMPREHENSIVE METABOLIC PANEL WITH GFR
ALT: 14 IU/L (ref 0–32)
AST: 17 IU/L (ref 0–40)
Albumin: 4 g/dL (ref 3.8–4.8)
Alkaline Phosphatase: 97 IU/L (ref 44–121)
BUN/Creatinine Ratio: 15 (ref 12–28)
BUN: 12 mg/dL (ref 8–27)
Bilirubin Total: 0.4 mg/dL (ref 0.0–1.2)
CO2: 22 mmol/L (ref 20–29)
Calcium: 9.3 mg/dL (ref 8.7–10.3)
Chloride: 103 mmol/L (ref 96–106)
Creatinine, Ser: 0.79 mg/dL (ref 0.57–1.00)
Globulin, Total: 2.4 g/dL (ref 1.5–4.5)
Glucose: 89 mg/dL (ref 70–99)
Potassium: 4.5 mmol/L (ref 3.5–5.2)
Sodium: 141 mmol/L (ref 134–144)
Total Protein: 6.4 g/dL (ref 6.0–8.5)
eGFR: 78 mL/min/{1.73_m2} (ref >59)

## 2024-02-13 LAB — LIPID PANEL
Chol/HDL Ratio: 3.6 ratio (ref 0.0–4.4)
Cholesterol, Total: 196 mg/dL (ref 100–199)
HDL: 55 mg/dL (ref >39)
LDL Chol Calc (NIH): 122 mg/dL — ABNORMAL HIGH (ref 0–99)
Triglycerides: 108 mg/dL (ref 0–149)
VLDL Cholesterol Cal: 19 mg/dL (ref 5–40)

## 2024-02-13 LAB — T4, FREE: Free T4: 1.14 ng/dL (ref 0.82–1.77)

## 2024-02-13 LAB — HEMOGLOBIN A1C
Est. average glucose Bld gHb Est-mCnc: 123 mg/dL
Hgb A1c MFr Bld: 5.9 % — ABNORMAL HIGH (ref 4.8–5.6)

## 2024-02-13 LAB — IRON,TIBC AND FERRITIN PANEL
Ferritin: 65 ng/mL (ref 15–150)
Iron Saturation: 22 % (ref 15–55)
Iron: 72 ug/dL (ref 27–139)
Total Iron Binding Capacity: 322 ug/dL (ref 250–450)
UIBC: 250 ug/dL (ref 118–369)

## 2024-02-13 LAB — MAGNESIUM: Magnesium: 2.1 mg/dL (ref 1.6–2.3)

## 2024-02-13 LAB — TSH: TSH: 2.12 u[IU]/mL (ref 0.450–4.500)

## 2024-02-16 NOTE — Assessment & Plan Note (Signed)
 Intolerant to statins.

## 2024-02-16 NOTE — Assessment & Plan Note (Signed)
 Recommend continue to work on eating healthy diet and exercise.

## 2024-02-28 DIAGNOSIS — E6609 Other obesity due to excess calories: Secondary | ICD-10-CM | POA: Diagnosis not present

## 2024-02-28 DIAGNOSIS — E785 Hyperlipidemia, unspecified: Secondary | ICD-10-CM | POA: Diagnosis not present

## 2024-02-28 DIAGNOSIS — I471 Supraventricular tachycardia, unspecified: Secondary | ICD-10-CM | POA: Diagnosis not present

## 2024-02-28 DIAGNOSIS — I10 Essential (primary) hypertension: Secondary | ICD-10-CM | POA: Diagnosis not present

## 2024-02-28 DIAGNOSIS — R001 Bradycardia, unspecified: Secondary | ICD-10-CM | POA: Diagnosis not present

## 2024-02-28 DIAGNOSIS — Z6832 Body mass index (BMI) 32.0-32.9, adult: Secondary | ICD-10-CM | POA: Diagnosis not present

## 2024-02-28 DIAGNOSIS — I7121 Aneurysm of the ascending aorta, without rupture: Secondary | ICD-10-CM | POA: Diagnosis not present

## 2024-02-28 DIAGNOSIS — I700 Atherosclerosis of aorta: Secondary | ICD-10-CM | POA: Diagnosis not present

## 2024-02-28 DIAGNOSIS — R002 Palpitations: Secondary | ICD-10-CM | POA: Diagnosis not present

## 2024-02-28 DIAGNOSIS — I358 Other nonrheumatic aortic valve disorders: Secondary | ICD-10-CM | POA: Diagnosis not present

## 2024-02-28 DIAGNOSIS — E66811 Obesity, class 1: Secondary | ICD-10-CM | POA: Diagnosis not present

## 2024-03-16 DIAGNOSIS — M79651 Pain in right thigh: Secondary | ICD-10-CM | POA: Diagnosis not present

## 2024-03-17 ENCOUNTER — Encounter: Payer: Self-pay | Admitting: Family Medicine

## 2024-03-17 ENCOUNTER — Ambulatory Visit: Admitting: Family Medicine

## 2024-03-17 ENCOUNTER — Ambulatory Visit: Payer: Self-pay | Admitting: *Deleted

## 2024-03-17 VITALS — BP 125/58 | HR 70 | Temp 97.8°F | Resp 18 | Ht 66.0 in | Wt 210.2 lb

## 2024-03-17 DIAGNOSIS — M7989 Other specified soft tissue disorders: Secondary | ICD-10-CM | POA: Diagnosis not present

## 2024-03-17 DIAGNOSIS — M79604 Pain in right leg: Secondary | ICD-10-CM | POA: Diagnosis not present

## 2024-03-17 DIAGNOSIS — M545 Low back pain, unspecified: Secondary | ICD-10-CM

## 2024-03-17 DIAGNOSIS — M5431 Sciatica, right side: Secondary | ICD-10-CM

## 2024-03-17 DIAGNOSIS — Z86718 Personal history of other venous thrombosis and embolism: Secondary | ICD-10-CM | POA: Diagnosis not present

## 2024-03-17 DIAGNOSIS — M79651 Pain in right thigh: Secondary | ICD-10-CM | POA: Diagnosis not present

## 2024-03-17 MED ORDER — PREDNISONE 50 MG PO TABS: 50.0000 mg | ORAL_TABLET | Freq: Every day | ORAL | 0 refills | Status: DC

## 2024-03-17 NOTE — Progress Notes (Signed)
 Subjective:  Patient ID: Patricia Leonard, female    DOB: [birth year]  Age: 73 y.o. MRN: [id number]  Chief Complaint  Patient presents with   Follow-up    Emergency Department    HPI: 73 yo female presented to ED at [facility] for right upper leg pain and it started since Mother's day. She has also had low back pain. Now cramping in back and right leg. Right foot will draw and hurt. Patient was seen to rule out dvt. She did rule out dvt by doppler ultrasound.        02/12/2024    8:42 AM 11/13/2023    8:13 AM 08/01/2023    1:32 PM 04/30/2023    1:29 PM 04/11/2023    3:49 PM  Depression screen PHQ 2/9  Decreased Interest 0  1 1 1   Down, Depressed, Hopeless 0 0 1 0 1  PHQ - 2 Score 0 0 2 1 2   Altered sleeping 1 2 1 2 2   Tired, decreased energy 1 1 1 1 1   Change in appetite 0 0 1 2 1   Feeling bad or failure about yourself  0 0 0 0 0  Trouble concentrating 0 0 0 1 1  Moving slowly or fidgety/restless 0 0 0 0 0  Suicidal thoughts 0 0 0 0 0  PHQ-9 Score 2 3 5 7 7   Difficult doing work/chores Not difficult at all Not difficult at all Somewhat difficult Not difficult at all Not difficult at all        02/12/2024    8:42 AM  Fall Risk   Falls in the past year? 1  Number falls in past yr: 0  Injury with Fall? 0  Risk for fall due to : No Fall Risks  Follow up Falls evaluation completed    Patient Care Team: Sherre Clapper, MD as PCP - General (Family Medicine) Charlanne Groom, MD as Consulting Physician (Gastroenterology) Mat Browning, MD as Consulting Physician (Obstetrics and Gynecology) Raylene Debby MATSU., MD as Referring Physician (Cardiology)   Review of Systems  Constitutional:  Negative for chills, diaphoresis, fatigue and fever.  HENT:  Negative for congestion, ear pain and sinus pain.   Eyes: Negative.   Respiratory:  Negative for cough and shortness of breath.   Cardiovascular:  Negative for chest pain.  Gastrointestinal:  Negative for abdominal pain, constipation, diarrhea,  nausea and vomiting.  Endocrine: Negative.   Genitourinary:  Negative for dysuria, frequency and urgency.  Musculoskeletal:  Negative for arthralgias.  Skin: Negative.   Allergic/Immunologic: Negative.   Neurological:  Negative for weakness and headaches.  Hematological: Negative.   Psychiatric/Behavioral:  Negative for dysphoric mood. The patient is not nervous/anxious.     Current Outpatient Medications on File Prior to Visit  Medication Sig Dispense Refill   aspirin EC 81 MG tablet Take 81 mg by mouth daily. Swallow whole.     Biotin 10 MG CAPS Take 10 mg by mouth daily.     clotrimazole-betamethasone (LOTRISONE) cream Apply 1 Application topically 2 (two) times daily as needed.     cyanocobalamin  1000 MCG tablet Take 1,000 mcg by mouth daily.     cyclobenzaprine  (FLEXERIL ) 5 MG tablet Take 5 mg by mouth.     dicyclomine  (BENTYL ) 10 MG capsule Take 1 capsule (10 mg total) by mouth 3 (three) times daily before meals. As needed (Patient taking differently: Take 10 mg by mouth 3 (three) times daily as needed for spasms. As needed) 90 capsule 8  diltiazem (CARDIZEM CD) 120 MG 24 hr capsule Take 120 mg by mouth daily.     ketoconazole  (NIZORAL ) 2 % cream Apply to lower abdominal rash (Patient taking differently: Apply 1 Application topically daily as needed for irritation. Apply to lower abdominal rash) 15 g 0   magnesium hydroxide (MILK OF MAGNESIA) 400 MG/5ML suspension Take 15 mLs by mouth daily as needed for mild constipation.     metoCLOPramide  (REGLAN ) 5 MG tablet Take 1 tablet (5 mg total) by mouth every 8 (eight) hours as needed for nausea or vomiting. 60 tablet 1   Omega-3 Fatty Acids (FISH OIL PO) Take 1 tablet by mouth daily at 6 (six) AM.     ondansetron  (ZOFRAN ) 4 MG tablet Take 1 tablet (4 mg total) by mouth every 8 (eight) hours as needed for nausea or vomiting. 20 tablet 4   pantoprazole  (PROTONIX ) 40 MG tablet Take 1 tablet (40 mg total) by mouth daily. 90 tablet 2    pyridoxine (B-6) 100 MG tablet Take 100 mg by mouth daily.     calcium -vitamin D  (OSCAL WITH D) 500-200 MG-UNIT TABS tablet Take by mouth. (Patient not taking: Reported on 03/17/2024)     Vitamin D , Ergocalciferol , (DRISDOL ) 1.25 MG (50000 UNIT) CAPS capsule Take 50,000 Units by mouth every 7 (seven) days. (Patient not taking: Reported on 03/17/2024)     No current facility-administered medications on file prior to visit.   Past Medical History:  Diagnosis Date   Allergy    Anemia    Aneurysm of ascending aorta without rupture (HCC)    Anxiety    Aortic atherosclerosis (HCC)    Arthritis    Cataract    Depression    Family history of colon cancer    Fibromyalgia    GERD (gastroesophageal reflux disease)    Hyperlipidemia    IBS (irritable bowel syndrome)    Moderate recurrent major depression (HCC) 08/05/2021   OSA (obstructive sleep apnea)    Rectal prolapse    Stomach ulcer    Tick bite    took antibiotics memorial day weekend 2021 on back   Past Surgical History:  Procedure Laterality Date   APPENDECTOMY     CESAREAN SECTION     CHOLECYSTECTOMY     COLONOSCOPY  04/18/2014   Diverticulosis.    ESOPHAGOGASTRODUODENOSCOPY  04/14/2016   Schatzkis ring. Hiatal hernia. Gastritis.    HERNIA REPAIR  11/07/2021   Hiatal   LIPOMA EXCISION     NISSEN FUNDOPLICATION  11/07/2021   [facility]   thermal ablation     TONSILLECTOMY      Family History  Problem Relation Age of Onset   Colon cancer Mother    Colon cancer Father    Liver cancer Father    CAD Father    Hypertension Sister    Hyperlipidemia Brother    Gout Brother    Cancer Maternal Aunt        female cancer, not breast   Cancer Paternal Aunt        female cancer, not breast   Diabetes Maternal Grandmother    Diabetes Paternal Grandmother    Heart attack Other    Cancer Other        Leiomyosarcome and Liver   Migraines Other    Colon polyps Neg Hx    Esophageal cancer  Neg Hx    Pancreatic cancer Neg Hx    Rectal cancer Neg Hx    Stomach  cancer Neg Hx    Social History   Socioeconomic History   Marital status: Widowed    Spouse name: Not on file   Number of children: 2   Years of education: Not on file   Highest education level: High school graduate  Occupational History   Not on file  Tobacco Use   Smoking status: Never   Smokeless tobacco: Never  Vaping Use   Vaping status: Never Used  Substance and Sexual Activity   Alcohol use: Never   Drug use: Never   Sexual activity: Not Currently  Other Topics Concern   Not on file  Social History Narrative   Not on file   Social Drivers of Health   Financial Resource Strain: Medium Risk (04/10/2023)   Overall Financial Resource Strain (CARDIA)    Difficulty of Paying Living Expenses: Somewhat hard  Food Insecurity: No Food Insecurity (04/10/2023)   Hunger Vital Sign    Worried About Running Out of Food in the Last Year: Never true    Ran Out of Food in the Last Year: Never true  Transportation Needs: No Transportation Needs (04/10/2023)   PRAPARE - Administrator, Civil Service (Medical): No    Lack of Transportation (Non-Medical): No  Physical Activity: Insufficiently Active (04/10/2023)   Exercise Vital Sign    Days of Exercise per Week: 7 days    Minutes of Exercise per Session: 20 min  Stress: No Stress Concern Present (04/10/2023)   [employer] of Occupational Health - Occupational Stress Questionnaire    Feeling of Stress : Only a little  Social Connections: Moderately Integrated (04/10/2023)   Social Connection and Isolation Panel    Frequency of Communication with Friends and Family: Once a week    Frequency of Social Gatherings with Friends and Family: Three times a week    Attends Religious Services: More than 4 times per year    Active Member of Clubs or Organizations: Yes    Attends Banker Meetings: More than 4 times per year    Marital Status:  Widowed    Objective:  BP 102/60   Pulse 70   Temp 97.8 F (36.6 C) (Temporal)   Resp 18   Ht 5' 6 (1.676 m)   Wt 210 lb 3.2 oz (95.3 kg)   SpO2 97%   BMI 33.93 kg/m      03/17/2024    3:41 PM 02/12/2024    8:47 AM 11/13/2023    8:12 AM  BP/Weight  Systolic BP 102 124 130  Diastolic BP 60 78 70  Wt. (Lbs) 210.2 209 209  BMI 33.93 kg/m2 33.73 kg/m2 33.73 kg/m2    Physical Exam      Lab Results  Component Value Date   WBC 4.4 02/12/2024   HGB 14.1 02/12/2024   HCT 43.6 02/12/2024   PLT 204 02/12/2024   GLUCOSE 89 02/12/2024   CHOL 196 02/12/2024   TRIG 108 02/12/2024   HDL 55 02/12/2024   LDLCALC 122 (H) 02/12/2024   ALT 14 02/12/2024   AST 17 02/12/2024   NA 141 02/12/2024   K 4.5 02/12/2024   CL 103 02/12/2024   CREATININE 0.79 02/12/2024   BUN 12 02/12/2024   CO2 22 02/12/2024   TSH 2.120 02/12/2024   HGBA1C 5.9 (H) 02/12/2024      Assessment & Plan:  There are no diagnoses linked to this encounter.   No orders of the defined types were placed in this encounter.  No orders of the defined types were placed in this encounter.    Follow-up: No follow-ups on file.   I,Cortlin Marano,acting as a Neurosurgeon for Abigail Free, MD.,have documented all relevant documentation on the behalf of Abigail Free, MD,as directed by  Abigail Free, MD while in the presence of Abigail Free, MD.   An After Visit Summary was printed and given to the patient.  Abigail Free, MD [facility] [phone]

## 2024-03-17 NOTE — Telephone Encounter (Signed)
     FYI Only or Action Required?: Action required by provider: referral request and clinical question for provider.  Patient was last seen in primary care on 02/12/2024 by Sherre Clapper, MD. Called Nurse Triage reporting Hip Pain. Symptoms began yesterday. Interventions attempted: Prescription medications: requesting if Rx should be taken with other prescribed medications. Symptoms are: unchanged.  Triage Disposition: See HCP Within 4 Hours (Or PCP Triage)  Patient/caregiver understands and will follow disposition?: No, wishes to speak with PCP                    Copied from CRM [id number]. Topic: Clinical - Red Word Triage >> Mar 17, 2024  9:21 AM Patricia Leonard wrote: Red Word that prompted transfer to Nurse Triage: patient is in pain, on muscle relaxer. No hospital f/u appts Reason for Disposition  [1] SEVERE pain (e.g., excruciating, unable to do any normal activities) AND [2] not improved after 2 hours of pain medicine  Answer Assessment - Initial Assessment Questions 1. LOCATION and RADIATION: Where is the pain located?      Right hip pain 2. QUALITY: What does the pain feel like?  (e.g., sharp, dull, aching, burning)     Hurts to but weight on right leg. Toothache in leg.  3. SEVERITY: How bad is the pain? What does it keep you from doing?   (Scale 1-10; or mild, moderate, severe)   -  MILD (1-3): doesn't interfere with normal activities    -  MODERATE (4-7): interferes with normal activities (e.g., work or school) or awakens from sleep, limping    -  SEVERE (8-10): excruciating pain, unable to do any normal activities, unable to walk     6/10. Limping to walk, constant cramping in leg to foot 4. ONSET: When did the pain start? Does it come and go, or is it there all the time?     Yesterday  5. WORK OR EXERCISE: Has there been any recent work or exercise that involved this part of the body?      na 6. CAUSE: What do you think is causing the hip pain?       Not sure  7. AGGRAVATING FACTORS: What makes the hip pain worse? (e.g., walking, climbing stairs, running)     Walking  8. OTHER SYMPTOMS: Do you have any other symptoms? (e.g., back pain, pain shooting down leg,  fever, rash)     Right hip pain shooting down front of leg to foot. Constant cramping , toothache feeling. When pain severe right toes draw. Reports legs feel like bee stings.   Patient seen in ED last night. Requesting PCP call back to confirm if patient should take flexeril  prescribed by ED. Recommended patient call pharmacy for compatibility with other medications. Should patient do recommended US  recommended last night by ED today at [facility] or can PCP schedule US  to be done at [facility]? Requesting if PT referral needed. NT recommended patient to get US  as ordered due to hx blood clot in lung. Please advise.  Pt requesting Call back on home phone  Protocols used: Hip Pain-A-AH

## 2024-03-17 NOTE — Patient Instructions (Signed)
 Prednisone  50 mg once daily.  Continue flexeril   Refer physical therapy.

## 2024-03-17 NOTE — Telephone Encounter (Signed)
 Agree

## 2024-03-19 DIAGNOSIS — M9902 Segmental and somatic dysfunction of thoracic region: Secondary | ICD-10-CM | POA: Diagnosis not present

## 2024-03-19 DIAGNOSIS — M7918 Myalgia, other site: Secondary | ICD-10-CM | POA: Diagnosis not present

## 2024-03-19 DIAGNOSIS — S39012A Strain of muscle, fascia and tendon of lower back, initial encounter: Secondary | ICD-10-CM | POA: Diagnosis not present

## 2024-03-19 DIAGNOSIS — M4726 Other spondylosis with radiculopathy, lumbar region: Secondary | ICD-10-CM | POA: Diagnosis not present

## 2024-03-19 DIAGNOSIS — M9905 Segmental and somatic dysfunction of pelvic region: Secondary | ICD-10-CM | POA: Diagnosis not present

## 2024-03-19 DIAGNOSIS — M9903 Segmental and somatic dysfunction of lumbar region: Secondary | ICD-10-CM | POA: Diagnosis not present

## 2024-03-20 ENCOUNTER — Encounter: Payer: Self-pay | Admitting: Family Medicine

## 2024-03-20 ENCOUNTER — Ambulatory Visit: Payer: Self-pay

## 2024-03-20 ENCOUNTER — Ambulatory Visit (HOSPITAL_BASED_OUTPATIENT_CLINIC_OR_DEPARTMENT_OTHER)
Admission: RE | Admit: 2024-03-20 | Discharge: 2024-03-20 | Disposition: A | Discharge status: Home / Self Care | Source: Ambulatory Visit | Attending: Physician Assistant | Admitting: Physician Assistant

## 2024-03-20 ENCOUNTER — Ambulatory Visit: Admitting: Physician Assistant

## 2024-03-20 ENCOUNTER — Encounter: Payer: Self-pay | Admitting: Physician Assistant

## 2024-03-20 VITALS — BP 102/58 | HR 71 | Temp 97.1°F | Resp 16 | Ht 66.0 in | Wt 212.0 lb

## 2024-03-20 DIAGNOSIS — M8589 Other specified disorders of bone density and structure, multiple sites: Secondary | ICD-10-CM | POA: Diagnosis not present

## 2024-03-20 DIAGNOSIS — F19922 Other psychoactive substance use, unspecified with intoxication with perceptual disturbance: Secondary | ICD-10-CM

## 2024-03-20 DIAGNOSIS — M5416 Radiculopathy, lumbar region: Secondary | ICD-10-CM

## 2024-03-20 DIAGNOSIS — I959 Hypotension, unspecified: Secondary | ICD-10-CM | POA: Insufficient documentation

## 2024-03-20 DIAGNOSIS — G8929 Other chronic pain: Secondary | ICD-10-CM | POA: Diagnosis not present

## 2024-03-20 DIAGNOSIS — R29898 Other symptoms and signs involving the musculoskeletal system: Secondary | ICD-10-CM | POA: Diagnosis not present

## 2024-03-20 DIAGNOSIS — M47816 Spondylosis without myelopathy or radiculopathy, lumbar region: Secondary | ICD-10-CM | POA: Diagnosis not present

## 2024-03-20 DIAGNOSIS — M5431 Sciatica, right side: Secondary | ICD-10-CM | POA: Insufficient documentation

## 2024-03-20 DIAGNOSIS — M545 Low back pain, unspecified: Secondary | ICD-10-CM | POA: Insufficient documentation

## 2024-03-20 DIAGNOSIS — M48061 Spinal stenosis, lumbar region without neurogenic claudication: Secondary | ICD-10-CM | POA: Diagnosis not present

## 2024-03-20 DIAGNOSIS — I952 Hypotension due to drugs: Secondary | ICD-10-CM

## 2024-03-20 MED ORDER — NALOXONE HCL 4 MG/0.1ML NA LIQD: NASAL | 0 refills | Status: DC

## 2024-03-20 MED ORDER — KETOROLAC TROMETHAMINE 60 MG/2ML IM SOLN
60.0000 mg | Freq: Once | INTRAMUSCULAR | Status: AC
  Administered 2024-03-20: 60 mg via INTRAMUSCULAR

## 2024-03-20 NOTE — Assessment & Plan Note (Signed)
 Chronic condition with bulging disc and spinal stenosis, contributing to recurrent sciatica. Previous neurosurgical consultation indicated no surgical intervention was possible. - Order x-rays to evaluate any changes in the spine. - Consider referral to a spine surgeon if x-rays indicate significant changes or if symptoms persist.

## 2024-03-20 NOTE — Assessment & Plan Note (Signed)
 Prednisone  50 mg once daily.  Continue flexeril   Refer physical therapy.

## 2024-03-20 NOTE — Assessment & Plan Note (Addendum)
 Episodes of hypotension potentially exacerbated by hydrocodone use, potentiated by diltiazem. Reports dizziness and feeling loopy, likely due to these medications. - Administer naloxone in the clinic. - Monitor blood pressure after discontinuing hydrocodone. BP Readings from Last 3 Encounters:  03/20/24 96/60  03/17/24 (!) 125/58  02/12/24 124/78

## 2024-03-20 NOTE — Telephone Encounter (Signed)
 Seen today. Dr. Sherre

## 2024-03-20 NOTE — Telephone Encounter (Signed)
 FYI Only or Action Required?: Action required by provider: wants to know if she can get a cortisone shot.  Patient was last seen in primary care on 03/17/2024 by Sherre Clapper, MD. Called Nurse Triage reporting Back Pain. Symptoms began several months ago. Interventions attempted: Prescription medications: prednisone , muscle relaxer. Symptoms are: gradually worsening.  Triage Disposition: See HCP Within 4 Hours (Or PCP Triage)  Patient/caregiver understands and will follow disposition?: Yes Copied from CRM [id number]. Topic: Clinical - Red Word Triage >> Mar 17, 2024  9:21 AM Avram MATSU wrote: Red Word that prompted transfer to Nurse Triage: patient is in pain, on muscle relaxer. No hospital f/u appts >> Mar 20, 2024  8:25 AM Montie POUR wrote: Her sciatic nerve is causing her pain at over a 10. Pain in lower back, hip and going down her leg. She had to sleep on the floor last night due to pain  Reason for Disposition  [1] SEVERE back pain (e.g., excruciating, unable to do any normal activities) AND [2] not improved 2 hours after pain medicine  Answer Assessment - Initial Assessment Questions 1. ONSET: When did the pain begin?      Was seen in office on Monday; pain since Mother's day 2. LOCATION: Where does it hurt? (upper, mid or lower back)     Lower back, hip and going down her leg on right leg 3. SEVERITY: How bad is the pain?  (e.g., Scale 1-10; mild, moderate, or severe)   - MILD (1-3): Doesn't interfere with normal activities.    - MODERATE (4-7): Interferes with normal activities or awakens from sleep.    - SEVERE (8-10): Excruciating pain, unable to do any normal activities.      severe 4. PATTERN: Is the pain constant? (e.g., yes, no; constant, intermittent)      constant 5. RADIATION: Does the pain shoot into your legs or somewhere else?     Down right leg 6. CAUSE:  What do you think is causing the back pain?      Sciatic pain 7. BACK OVERUSE:  Any recent lifting of  heavy objects, strenuous work or exercise?     denies 8. MEDICINES: What have you taken so far for the pain? (e.g., nothing, acetaminophen , NSAIDS)     Prednisone , muscle relaxers 9. NEUROLOGIC SYMPTOMS: Do you have any weakness, numbness, or problems with bowel/bladder control?     Constipation  10. OTHER SYMPTOMS: Do you have any other symptoms? (e.g., fever, abdomen pain, burning with urination, blood in urine)       denies 11. PREGNANCY: Is there any chance you are pregnant? When was your last menstrual period?       na  Protocols used: Back Pain-A-AH

## 2024-03-20 NOTE — Assessment & Plan Note (Signed)
 Worsening sciatica with severe pain radiating from the right buttock to the foot. Bulging disc and spinal stenosis may contribute to nerve compression. Unable to take NSAIDs due to ulcers, and oxycodone has caused dizziness and hypotension. Discussed Toradol  injection benefits, which bypasses the stomach and does not interact with diltiazem. - Administer Toradol  injection in the office. - Order x-rays of the right hip and low back. - Refer to physical therapy. - Discontinue hydrocodone due to interaction with diltiazem and adverse effects.

## 2024-03-20 NOTE — Progress Notes (Signed)
 Subjective:  Patient ID: Patricia Leonard, female    DOB: [birth year]  Age: 73 y.o. MRN: [id number]  Chief Complaint  Patient presents with   Follow-up    Emergency Department    HPI: 73 yo female presented to ED at [facility] for right upper leg pain and it started since Mother's day. She has also had low back pain. Now cramping in back and right leg. Right foot will draw and hurt. Patient was seen to rule out dvt. She did rule out dvt by doppler ultrasound this morning.      02/12/2024    8:42 AM 11/13/2023    8:13 AM 08/01/2023    1:32 PM 04/30/2023    1:29 PM 04/11/2023    3:49 PM  Depression screen PHQ 2/9  Decreased Interest 0  1 1 1   Down, Depressed, Hopeless 0 0 1 0 1  PHQ - 2 Score 0 0 2 1 2   Altered sleeping 1 2 1 2 2   Tired, decreased energy 1 1 1 1 1   Change in appetite 0 0 1 2 1   Feeling bad or failure about yourself  0 0 0 0 0  Trouble concentrating 0 0 0 1 1  Moving slowly or fidgety/restless 0 0 0 0 0  Suicidal thoughts 0 0 0 0 0  PHQ-9 Score 2 3 5 7 7   Difficult doing work/chores Not difficult at all Not difficult at all Somewhat difficult Not difficult at all Not difficult at all        02/12/2024    8:42 AM  Fall Risk   Falls in the past year? 1  Number falls in past yr: 0  Injury with Fall? 0  Risk for fall due to : No Fall Risks  Follow up Falls evaluation completed    Patient Care Team: Sherre Clapper, MD as PCP - General (Family Medicine) Charlanne Groom, MD as Consulting Physician (Gastroenterology) Mat Browning, MD as Consulting Physician (Obstetrics and Gynecology) Raylene Debby MATSU., MD as Referring Physician (Cardiology)   Review of Systems  Constitutional:  Negative for chills, diaphoresis, fatigue and fever.  HENT:  Negative for congestion, ear pain and sinus pain.   Eyes: Negative.   Respiratory:  Negative for cough and shortness of breath.   Cardiovascular:  Negative for chest pain.  Gastrointestinal:  Negative for abdominal pain, constipation,  diarrhea, nausea and vomiting.  Endocrine: Negative.   Genitourinary:  Negative for dysuria, frequency and urgency.  Musculoskeletal:  Positive for back pain. Negative for arthralgias.  Skin: Negative.   Allergic/Immunologic: Negative.   Neurological:  Negative for weakness and headaches.  Hematological: Negative.   Psychiatric/Behavioral:  Negative for dysphoric mood. The patient is not nervous/anxious.     Current Outpatient Medications on File Prior to Visit  Medication Sig Dispense Refill   aspirin EC 81 MG tablet Take 81 mg by mouth daily. Swallow whole.     Biotin 10 MG CAPS Take 10 mg by mouth daily.     clotrimazole-betamethasone (LOTRISONE) cream Apply 1 Application topically 2 (two) times daily as needed.     cyanocobalamin  1000 MCG tablet Take 1,000 mcg by mouth daily.     cyclobenzaprine  (FLEXERIL ) 5 MG tablet Take 5 mg by mouth.     dicyclomine  (BENTYL ) 10 MG capsule Take 1 capsule (10 mg total) by mouth 3 (three) times daily before meals. As needed (Patient taking differently: Take 10 mg by mouth 3 (three) times daily as needed for spasms. As needed) 90  capsule 8   diltiazem (CARDIZEM CD) 120 MG 24 hr capsule Take 120 mg by mouth daily.     ketoconazole  (NIZORAL ) 2 % cream Apply to lower abdominal rash (Patient taking differently: Apply 1 Application topically daily as needed for irritation. Apply to lower abdominal rash) 15 g 0   magnesium hydroxide (MILK OF MAGNESIA) 400 MG/5ML suspension Take 15 mLs by mouth daily as needed for mild constipation.     metoCLOPramide  (REGLAN ) 5 MG tablet Take 1 tablet (5 mg total) by mouth every 8 (eight) hours as needed for nausea or vomiting. 60 tablet 1   Omega-3 Fatty Acids (FISH OIL PO) Take 1 tablet by mouth daily at 6 (six) AM.     ondansetron  (ZOFRAN ) 4 MG tablet Take 1 tablet (4 mg total) by mouth every 8 (eight) hours as needed for nausea or vomiting. 20 tablet 4   pantoprazole  (PROTONIX ) 40 MG tablet Take 1 tablet (40 mg total) by  mouth daily. 90 tablet 2   pyridoxine (B-6) 100 MG tablet Take 100 mg by mouth daily.     calcium -vitamin D  (OSCAL WITH D) 500-200 MG-UNIT TABS tablet Take by mouth.     Vitamin D , Ergocalciferol , (DRISDOL ) 1.25 MG (50000 UNIT) CAPS capsule Take 50,000 Units by mouth every 7 (seven) days.     No current facility-administered medications on file prior to visit.   Past Medical History:  Diagnosis Date   Allergy    Anemia    Aneurysm of ascending aorta without rupture (HCC)    Anxiety    Aortic atherosclerosis (HCC)    Arthritis    Cataract    Depression    Family history of colon cancer    Fibromyalgia    GERD (gastroesophageal reflux disease)    Hyperlipidemia    IBS (irritable bowel syndrome)    Moderate recurrent major depression (HCC) 08/05/2021   OSA (obstructive sleep apnea)    Rectal prolapse    Stomach ulcer    Tick bite    took antibiotics memorial day weekend 2021 on back   Past Surgical History:  Procedure Laterality Date   APPENDECTOMY     CESAREAN SECTION     CHOLECYSTECTOMY     COLONOSCOPY  04/18/2014   Diverticulosis.    ESOPHAGOGASTRODUODENOSCOPY  04/14/2016   Schatzkis ring. Hiatal hernia. Gastritis.    HERNIA REPAIR  11/07/2021   Hiatal   LIPOMA EXCISION     NISSEN FUNDOPLICATION  11/07/2021   [facility]   thermal ablation     TONSILLECTOMY      Family History  Problem Relation Age of Onset   Colon cancer Mother    Colon cancer Father    Liver cancer Father    CAD Father    Hypertension Sister    Hyperlipidemia Brother    Gout Brother    Cancer Maternal Aunt        female cancer, not breast   Cancer Paternal Aunt        female cancer, not breast   Diabetes Maternal Grandmother    Diabetes Paternal Grandmother    Heart attack Other    Cancer Other        Leiomyosarcome and Liver   Migraines Other    Colon polyps Neg Hx    Esophageal cancer Neg Hx    Pancreatic cancer Neg Hx    Rectal cancer Neg Hx     Stomach cancer Neg Hx    Social History  Socioeconomic History   Marital status: Widowed    Spouse name: Not on file   Number of children: 2   Years of education: Not on file   Highest education level: High school graduate  Occupational History   Not on file  Tobacco Use   Smoking status: Never   Smokeless tobacco: Never  Vaping Use   Vaping status: Never Used  Substance and Sexual Activity   Alcohol use: Never   Drug use: Never   Sexual activity: Not Currently  Other Topics Concern   Not on file  Social History Narrative   Not on file   Social Drivers of Health   Financial Resource Strain: Medium Risk (04/10/2023)   Overall Financial Resource Strain (CARDIA)    Difficulty of Paying Living Expenses: Somewhat hard  Food Insecurity: No Food Insecurity (04/10/2023)   Hunger Vital Sign    Worried About Running Out of Food in the Last Year: Never true    Ran Out of Food in the Last Year: Never true  Transportation Needs: No Transportation Needs (04/10/2023)   PRAPARE - Administrator, Civil Service (Medical): No    Lack of Transportation (Non-Medical): No  Physical Activity: Insufficiently Active (04/10/2023)   Exercise Vital Sign    Days of Exercise per Week: 7 days    Minutes of Exercise per Session: 20 min  Stress: No Stress Concern Present (04/10/2023)   [employer] of Occupational Health - Occupational Stress Questionnaire    Feeling of Stress : Only a little  Social Connections: Moderately Integrated (04/10/2023)   Social Connection and Isolation Panel    Frequency of Communication with Friends and Family: Once a week    Frequency of Social Gatherings with Friends and Family: Three times a week    Attends Religious Services: More than 4 times per year    Active Member of Clubs or Organizations: Yes    Attends Banker Meetings: More than 4 times per year    Marital Status: Widowed    Objective:  BP (!) 125/58   Pulse 70   Temp  97.8 F (36.6 C) (Temporal)   Resp 18   Ht 5' 6 (1.676 m)   Wt 210 lb 3.2 oz (95.3 kg)   SpO2 97%   BMI 33.93 kg/m      03/20/2024   10:57 AM 03/20/2024    9:27 AM 03/17/2024    4:34 PM  BP/Weight  Systolic BP 102 96 125  Diastolic BP 58 60 58  Wt. (Lbs)  212   BMI  34.22 kg/m2     Physical Exam Vitals reviewed.  Constitutional:      Appearance: Normal appearance.   Cardiovascular:     Rate and Rhythm: Normal rate and regular rhythm.     Heart sounds: Normal heart sounds.  Pulmonary:     Effort: Pulmonary effort is normal.     Breath sounds: Normal breath sounds.   Musculoskeletal:     Comments: Discomfort with flexion of right hip.  Symmetrical very mild tenderness over trochanteric bursae.  Tenderness over right lumbar paraspinal muscles and buttock.  Mild right straight leg raise.  Negative left straight leg raise   Neurological:     Mental Status: She is alert.         Lab Results  Component Value Date   WBC 4.4 02/12/2024   HGB 14.1 02/12/2024   HCT 43.6 02/12/2024   PLT 204 02/12/2024   GLUCOSE 89 02/12/2024  CHOL 196 02/12/2024   TRIG 108 02/12/2024   HDL 55 02/12/2024   LDLCALC 122 (H) 02/12/2024   ALT 14 02/12/2024   AST 17 02/12/2024   NA 141 02/12/2024   K 4.5 02/12/2024   CL 103 02/12/2024   CREATININE 0.79 02/12/2024   BUN 12 02/12/2024   CO2 22 02/12/2024   TSH 2.120 02/12/2024   HGBA1C 5.9 (H) 02/12/2024      Assessment & Plan:  Lumbar back pain Assessment & Plan: Prednisone  50 mg once daily.  Continue flexeril   Refer physical therapy.   Orders: -     predniSONE ; Take 1 tablet (50 mg total) by mouth daily with breakfast.  Dispense: 5 tablet; Refill: 0 -     Ambulatory referral to Physical Therapy  Sciatica of right side Assessment & Plan: Prednisone  50 mg once daily.  Continue flexeril   Refer physical therapy.   Orders: -     predniSONE ; Take 1 tablet (50 mg total) by mouth daily with breakfast.  Dispense: 5 tablet;  Refill: 0 -     Ambulatory referral to Physical Therapy     Meds ordered this encounter  Medications   predniSONE  (DELTASONE ) 50 MG tablet    Sig: Take 1 tablet (50 mg total) by mouth daily with breakfast.    Dispense:  5 tablet    Refill:  0    Orders Placed This Encounter  Procedures   Ambulatory referral to Physical Therapy     Follow-up: Return if symptoms worsen or fail to improve.   I,Angela Taylor,acting as a Neurosurgeon for Abigail Free, MD.,have documented all relevant documentation on the behalf of Abigail Free, MD,as directed by  Abigail Free, MD while in the presence of Abigail Free, MD.   An After Visit Summary was printed and given to the patient.  I attest that I have reviewed this visit and agree with the plan scribed by my staff.  Abigail Free, MD [facility] [phone]

## 2024-03-20 NOTE — Progress Notes (Signed)
 Acute Office Visit  Subjective:    Patient ID: Patricia Leonard, female    DOB: 1951-07-06, 73 y.o.   MRN: [id number]  Chief Complaint  Patient presents with   Sciatic pain    HPI: Patient is in today for right hip pain with radiation sciatic pain since one week ago. She was seen on 03/17/24 by Dr Sherre and she is taking flexeril  5 mg TID PRN, prednisone  50 mg daily, and oxycodone that she had in home for previous problem. However, it did not works anything.  Discussed the use of AI scribe software for clinical note transcription with the patient, who gave verbal consent to proceed.  History of Present Illness   Patricia Leonard is a 73 year old female with spinal stenosis and bulging discs who presents with worsening sciatica.  She experiences severe sciatica, described as a 'toothache or earache' sensation, radiating from the right buttock down to the foot, causing her toes to draw. The pain has worsened despite recent chiropractic treatment, and she has resorted to sleeping on the floor for relief.  Her history includes spinal stenosis and bulging discs, with previous episodes managed conservatively for five years. A neurosurgeon previously advised that surgery was not an option due to the nature of her spinal issues.  She has been using oxycodone from a previous surgery for pain relief, but its effectiveness has diminished. She cannot take NSAIDs due to a history of ulcers and stomach issues. She is currently on a course of oral steroids, having taken the third dose out of five today.  She recently started on diltiazem, a calcium  channel blocker, and reports dizziness and low blood pressure. She is concerned about potential interactions with other medications and supplements.  She has been referred to physical therapy but has not yet started sessions.       Past Medical History:  Diagnosis Date   Allergy    Anemia    Aneurysm of ascending aorta without rupture (HCC)    Anxiety     Aortic atherosclerosis (HCC)    Arthritis    Cataract    Depression    Family history of colon cancer    Fibromyalgia    GERD (gastroesophageal reflux disease)    Hyperlipidemia    IBS (irritable bowel syndrome)    Moderate recurrent major depression (HCC) 08/05/2021   OSA (obstructive sleep apnea)    Rectal prolapse    Stomach ulcer    Tick bite    took antibiotics memorial day weekend 2021 on back    Past Surgical History:  Procedure Laterality Date   APPENDECTOMY     CESAREAN SECTION     CHOLECYSTECTOMY     COLONOSCOPY  04/18/2014   Diverticulosis.    ESOPHAGOGASTRODUODENOSCOPY  04/14/2016   Schatzkis ring. Hiatal hernia. Gastritis.    HERNIA REPAIR  11/07/2021   Hiatal   LIPOMA EXCISION     NISSEN FUNDOPLICATION  11/07/2021   [facility]   thermal ablation     TONSILLECTOMY      Family History  Problem Relation Age of Onset   Colon cancer Mother    Colon cancer Father    Liver cancer Father    CAD Father    Hypertension Sister    Hyperlipidemia Brother    Gout Brother    Cancer Maternal Aunt        female cancer, not breast   Cancer Paternal Aunt  female cancer, not breast   Diabetes Maternal Grandmother    Diabetes Paternal Grandmother    Heart attack Other    Cancer Other        Leiomyosarcome and Liver   Migraines Other    Colon polyps Neg Hx    Esophageal cancer Neg Hx    Pancreatic cancer Neg Hx    Rectal cancer Neg Hx    Stomach cancer Neg Hx     Social History   Socioeconomic History   Marital status: Widowed    Spouse name: Not on file   Number of children: 2   Years of education: Not on file   Highest education level: High school graduate  Occupational History   Not on file  Tobacco Use   Smoking status: Never   Smokeless tobacco: Never  Vaping Use   Vaping status: Never Used  Substance and Sexual Activity   Alcohol use: Never   Drug use: Never   Sexual activity: Not Currently   Other Topics Concern   Not on file  Social History Narrative   Not on file   Social Drivers of Health   Financial Resource Strain: Medium Risk (04/10/2023)   Overall Financial Resource Strain (CARDIA)    Difficulty of Paying Living Expenses: Somewhat hard  Food Insecurity: No Food Insecurity (04/10/2023)   Hunger Vital Sign    Worried About Running Out of Food in the Last Year: Never true    Ran Out of Food in the Last Year: Never true  Transportation Needs: No Transportation Needs (04/10/2023)   PRAPARE - Administrator, Civil Service (Medical): No    Lack of Transportation (Non-Medical): No  Physical Activity: Insufficiently Active (04/10/2023)   Exercise Vital Sign    Days of Exercise per Week: 7 days    Minutes of Exercise per Session: 20 min  Stress: No Stress Concern Present (04/10/2023)   [employer] of Occupational Health - Occupational Stress Questionnaire    Feeling of Stress : Only a little  Social Connections: Moderately Integrated (04/10/2023)   Social Connection and Isolation Panel    Frequency of Communication with Friends and Family: Once a week    Frequency of Social Gatherings with Friends and Family: Three times a week    Attends Religious Services: More than 4 times per year    Active Member of Clubs or Organizations: Yes    Attends Banker Meetings: More than 4 times per year    Marital Status: Widowed  Intimate Partner Violence: Not At Risk (04/30/2023)   Humiliation, Afraid, Rape, and Kick questionnaire    Fear of Current or Ex-Partner: No    Emotionally Abused: No    Physically Abused: No    Sexually Abused: No    Outpatient Medications Prior to Visit  Medication Sig Dispense Refill   aspirin EC 81 MG tablet Take 81 mg by mouth daily. Swallow whole.     Biotin 10 MG CAPS Take 10 mg by mouth daily.     calcium -vitamin D  (OSCAL WITH D) 500-200 MG-UNIT TABS tablet Take by mouth.     clotrimazole-betamethasone (LOTRISONE) cream  Apply 1 Application topically 2 (two) times daily as needed.     cyanocobalamin  1000 MCG tablet Take 1,000 mcg by mouth daily.     cyclobenzaprine  (FLEXERIL ) 5 MG tablet Take 5 mg by mouth.     dicyclomine  (BENTYL ) 10 MG capsule Take 1 capsule (10 mg total) by mouth 3 (three) times daily before  meals. As needed (Patient taking differently: Take 10 mg by mouth 3 (three) times daily as needed for spasms. As needed) 90 capsule 8   diltiazem (CARDIZEM CD) 120 MG 24 hr capsule Take 120 mg by mouth daily.     ketoconazole  (NIZORAL ) 2 % cream Apply to lower abdominal rash (Patient taking differently: Apply 1 Application topically daily as needed for irritation. Apply to lower abdominal rash) 15 g 0   magnesium hydroxide (MILK OF MAGNESIA) 400 MG/5ML suspension Take 15 mLs by mouth daily as needed for mild constipation.     metoCLOPramide  (REGLAN ) 5 MG tablet Take 1 tablet (5 mg total) by mouth every 8 (eight) hours as needed for nausea or vomiting. 60 tablet 1   Omega-3 Fatty Acids (FISH OIL PO) Take 1 tablet by mouth daily at 6 (six) AM.     ondansetron  (ZOFRAN ) 4 MG tablet Take 1 tablet (4 mg total) by mouth every 8 (eight) hours as needed for nausea or vomiting. 20 tablet 4   pantoprazole  (PROTONIX ) 40 MG tablet Take 1 tablet (40 mg total) by mouth daily. 90 tablet 2   predniSONE  (DELTASONE ) 50 MG tablet Take 1 tablet (50 mg total) by mouth daily with breakfast. 5 tablet 0   pyridoxine (B-6) 100 MG tablet Take 100 mg by mouth daily.     Vitamin D , Ergocalciferol , (DRISDOL ) 1.25 MG (50000 UNIT) CAPS capsule Take 50,000 Units by mouth every 7 (seven) days.     No facility-administered medications prior to visit.    Allergies  Allergen Reactions   Amoxicillin Other (See Comments)   Clavulanic Acid Other (See Comments)   Statins     Muscle pain   Amoxicillin-Pot Clavulanate Nausea Only and Other (See Comments)    Stomach pain  Augmentin   Gadolinium Derivatives Rash   Iodinated Contrast Media  Other (See Comments) and Rash     Desc: HAD FACIAL RASH WITH CT CONTRAST IN 2006 HAD FACIAL RASH WITH CT CONTRAST IN 2006  Desc: HAD FACIAL RASH WITH CT CONTRAST IN 2006    Iohexol Rash and Other (See Comments)    HAD FACIAL RASH WITH CT CONTRAST IN 2006  iohexol    Review of Systems  Constitutional:  Negative for chills, fatigue and fever.  HENT:  Negative for congestion, ear pain and sore throat.   Respiratory:  Negative for cough and shortness of breath.   Cardiovascular:  Negative for chest pain and palpitations.  Gastrointestinal:  Negative for abdominal pain, constipation, diarrhea, nausea and vomiting.  Endocrine: Negative for polydipsia, polyphagia and polyuria.  Genitourinary:  Negative for difficulty urinating and dysuria.  Musculoskeletal:  Positive for arthralgias (Right hip pain). Negative for back pain and myalgias.  Skin:  Negative for rash.  Neurological:  Negative for headaches.  Psychiatric/Behavioral:  Negative for dysphoric mood. The patient is not nervous/anxious.        Objective:        03/20/2024    9:27 AM 03/17/2024    4:34 PM 03/17/2024    3:41 PM  Vitals with BMI  Height 5' 6  5' 6  Weight 212 lbs  210 lbs 3 oz  BMI 34.23  33.94  Systolic 96 125 102  Diastolic 60 58 60  Pulse 71  70    No data found.   Physical Exam Vitals reviewed.  Constitutional:      Appearance: Normal appearance.   Cardiovascular:     Rate and Rhythm: Normal rate and regular rhythm.  Heart sounds: Normal heart sounds.  Pulmonary:     Effort: Pulmonary effort is normal.     Breath sounds: Normal breath sounds.  Abdominal:     General: Bowel sounds are normal.     Palpations: Abdomen is soft.     Tenderness: There is no abdominal tenderness.   Neurological:     Mental Status: She is lethargic and disoriented.     Motor: Weakness present.   Psychiatric:        Mood and Affect: Mood normal.        Behavior: Behavior normal.     Health Maintenance  Due  Topic Date Due   DTaP/Tdap/Td (1 - Tdap) Never done   Zoster Vaccines- Shingrix (1 of 2) Never done   Pneumococcal Vaccine: 50+ Years (2 of 2 - PCV) 10/12/2019   Medicare Annual Wellness (AWV)  04/10/2024    There are no preventive care reminders to display for this patient.   Lab Results  Component Value Date   TSH 2.120 02/12/2024   Lab Results  Component Value Date   WBC 4.4 02/12/2024   HGB 14.1 02/12/2024   HCT 43.6 02/12/2024   MCV 93 02/12/2024   PLT 204 02/12/2024   Lab Results  Component Value Date   NA 141 02/12/2024   K 4.5 02/12/2024   CO2 22 02/12/2024   GLUCOSE 89 02/12/2024   BUN 12 02/12/2024   CREATININE 0.79 02/12/2024   BILITOT 0.4 02/12/2024   ALKPHOS 97 02/12/2024   AST 17 02/12/2024   ALT 14 02/12/2024   PROT 6.4 02/12/2024   ALBUMIN 4.0 02/12/2024   CALCIUM  9.3 02/12/2024   EGFR 78 02/12/2024   GFR 65.95 01/19/2020   Lab Results  Component Value Date   CHOL 196 02/12/2024   Lab Results  Component Value Date   HDL 55 02/12/2024   Lab Results  Component Value Date   LDLCALC 122 (H) 02/12/2024   Lab Results  Component Value Date   TRIG 108 02/12/2024   Lab Results  Component Value Date   CHOLHDL 3.6 02/12/2024   Lab Results  Component Value Date   HGBA1C 5.9 (H) 02/12/2024       Assessment & Plan:  Lumbar radiculopathy Assessment & Plan: Worsening sciatica with severe pain radiating from the right buttock to the foot. Bulging disc and spinal stenosis may contribute to nerve compression. Unable to take NSAIDs due to ulcers, and oxycodone has caused dizziness and hypotension. Discussed Toradol  injection benefits, which bypasses the stomach and does not interact with diltiazem. - Administer Toradol  injection in the office. - Order x-rays of the right hip and low back. - Refer to physical therapy. - Discontinue hydrocodone due to interaction with diltiazem and adverse effects.  Orders: -     Ketorolac  Tromethamine  -      DG HIP UNILAT W OR W/O PELVIS 2-3 VIEWS RIGHT; Future -     DG Lumbar Spine Complete; Future  Osteopenia of multiple sites Assessment & Plan: Sent for DEXA scan Will adjust treatment depending on results Admits to previous history of osteopenia.   Orders: -     HM DEXA SCAN -     DG Bone Density; Future  Drug intoxication with perceptual disturbance (HCC) Assessment & Plan: Narcan given in clinic Admitted to dramatic improvement BP improved as well.   Orders: -     Naloxone HCl; Given during visit  Dispense: 1 each; Refill: 0  Spinal stenosis of lumbar region, unspecified  whether neurogenic claudication present Assessment & Plan: Chronic condition with bulging disc and spinal stenosis, contributing to recurrent sciatica. Previous neurosurgical consultation indicated no surgical intervention was possible. - Order x-rays to evaluate any changes in the spine. - Consider referral to a spine surgeon if x-rays indicate significant changes or if symptoms persist.   Hypotension due to drugs Assessment & Plan: Episodes of hypotension potentially exacerbated by hydrocodone use, potentiated by diltiazem. Reports dizziness and feeling loopy, likely due to these medications. - Administer naloxone in the clinic. - Monitor blood pressure after discontinuing hydrocodone. BP Readings from Last 3 Encounters:  03/20/24 96/60  03/17/24 (!) 125/58  02/12/24 124/78        General Health Maintenance On diltiazem for cardiac condition with concerns about medication interactions. - Continue diltiazem as prescribed.  Follow-up Ongoing management of sciatica and related symptoms requires follow-up to assess treatment efficacy and adjust plans as needed. - Follow up after x-rays and initial physical therapy sessions.          Meds ordered this encounter  Medications   ketorolac  (TORADOL ) injection 60 mg   naloxone (NARCAN) nasal spray 4 mg/0.1 mL    Sig: Given during visit    Dispense:   1 each    Refill:  0    Orders Placed This Encounter  Procedures   DG Hip Unilat W OR W/O Pelvis 2-3 Views Right   HM DEXA SCAN   DG Lumbar Spine Complete   DG Bone Density     Follow-up: No follow-ups on file.  An After Visit Summary was printed and given to the patient.  Nola Angles, [place] [facility] [phone]

## 2024-03-20 NOTE — Assessment & Plan Note (Signed)
 Sent for DEXA scan Will adjust treatment depending on results Admits to previous history of osteopenia.

## 2024-03-20 NOTE — Assessment & Plan Note (Signed)
 Narcan given in clinic Admitted to dramatic improvement BP improved as well.

## 2024-03-21 ENCOUNTER — Ambulatory Visit: Admitting: Family Medicine

## 2024-03-23 ENCOUNTER — Ambulatory Visit: Payer: Self-pay | Admitting: Physician Assistant

## 2024-03-23 DIAGNOSIS — M51362 Other intervertebral disc degeneration, lumbar region with discogenic back pain and lower extremity pain: Secondary | ICD-10-CM

## 2024-03-24 DIAGNOSIS — M545 Low back pain, unspecified: Secondary | ICD-10-CM | POA: Diagnosis not present

## 2024-03-25 ENCOUNTER — Telehealth: Payer: Self-pay | Admitting: Family Medicine

## 2024-03-25 NOTE — Telephone Encounter (Signed)
 Proptosis Plan of Care

## 2024-03-26 DIAGNOSIS — M545 Low back pain, unspecified: Secondary | ICD-10-CM | POA: Diagnosis not present

## 2024-03-31 DIAGNOSIS — M545 Low back pain, unspecified: Secondary | ICD-10-CM | POA: Diagnosis not present

## 2024-03-31 DIAGNOSIS — M5459 Other low back pain: Secondary | ICD-10-CM | POA: Diagnosis not present

## 2024-04-03 DIAGNOSIS — M545 Low back pain, unspecified: Secondary | ICD-10-CM | POA: Diagnosis not present

## 2024-04-05 DIAGNOSIS — M5459 Other low back pain: Secondary | ICD-10-CM | POA: Diagnosis not present

## 2024-04-07 DIAGNOSIS — M545 Low back pain, unspecified: Secondary | ICD-10-CM | POA: Diagnosis not present

## 2024-04-09 DIAGNOSIS — M545 Low back pain, unspecified: Secondary | ICD-10-CM | POA: Diagnosis not present

## 2024-04-10 DIAGNOSIS — G4733 Obstructive sleep apnea (adult) (pediatric): Secondary | ICD-10-CM | POA: Diagnosis not present

## 2024-04-10 DIAGNOSIS — E785 Hyperlipidemia, unspecified: Secondary | ICD-10-CM | POA: Diagnosis not present

## 2024-04-10 DIAGNOSIS — E6609 Other obesity due to excess calories: Secondary | ICD-10-CM | POA: Diagnosis not present

## 2024-04-10 DIAGNOSIS — I7121 Aneurysm of the ascending aorta, without rupture: Secondary | ICD-10-CM | POA: Diagnosis not present

## 2024-04-10 DIAGNOSIS — I358 Other nonrheumatic aortic valve disorders: Secondary | ICD-10-CM | POA: Diagnosis not present

## 2024-04-10 DIAGNOSIS — E66811 Obesity, class 1: Secondary | ICD-10-CM | POA: Diagnosis not present

## 2024-04-10 DIAGNOSIS — Z6832 Body mass index (BMI) 32.0-32.9, adult: Secondary | ICD-10-CM | POA: Diagnosis not present

## 2024-04-10 DIAGNOSIS — R001 Bradycardia, unspecified: Secondary | ICD-10-CM | POA: Diagnosis not present

## 2024-04-10 DIAGNOSIS — I700 Atherosclerosis of aorta: Secondary | ICD-10-CM | POA: Diagnosis not present

## 2024-04-10 DIAGNOSIS — I10 Essential (primary) hypertension: Secondary | ICD-10-CM | POA: Diagnosis not present

## 2024-04-11 DIAGNOSIS — M545 Low back pain, unspecified: Secondary | ICD-10-CM | POA: Diagnosis not present

## 2024-04-14 ENCOUNTER — Telehealth: Payer: Self-pay

## 2024-04-14 ENCOUNTER — Ambulatory Visit (INDEPENDENT_AMBULATORY_CARE_PROVIDER_SITE_OTHER)
Admission: RE | Admit: 2024-04-14 | Discharge: 2024-04-14 | Disposition: A | Discharge status: Home / Self Care | Source: Ambulatory Visit | Attending: Physician Assistant | Admitting: Physician Assistant

## 2024-04-14 DIAGNOSIS — Z780 Asymptomatic menopausal state: Secondary | ICD-10-CM | POA: Diagnosis not present

## 2024-04-14 DIAGNOSIS — M8589 Other specified disorders of bone density and structure, multiple sites: Secondary | ICD-10-CM

## 2024-04-14 NOTE — Telephone Encounter (Signed)
 Copied from CRM [id number]. Topic: General - Call Back - No Documentation >> Apr 14, 2024  2:40 PM Sasha H wrote: Reason for CRM: Pt states [facility] can't get her in until the middle of August and she can't wait that long. She is requesting a call back >> Apr 14, 2024  3:42 PM Rosaria A wrote: Pt stated she was seen at [facility] on 7/16 and they scheduled her for a spinal injection mid August. She stated she needs some relief now and would like to get the injection done at [facility]. She is calling to get MRI faxed to office.

## 2024-04-15 DIAGNOSIS — M545 Low back pain, unspecified: Secondary | ICD-10-CM | POA: Diagnosis not present

## 2024-04-16 ENCOUNTER — Ambulatory Visit (INDEPENDENT_AMBULATORY_CARE_PROVIDER_SITE_OTHER): Admitting: Family Medicine

## 2024-04-16 ENCOUNTER — Other Ambulatory Visit: Payer: Self-pay | Admitting: Orthopedic Surgery

## 2024-04-16 ENCOUNTER — Encounter: Payer: Self-pay | Admitting: Family Medicine

## 2024-04-16 VITALS — BP 124/68 | HR 87 | Temp 98.2°F | Ht 66.0 in | Wt 213.0 lb

## 2024-04-16 DIAGNOSIS — K581 Irritable bowel syndrome with constipation: Secondary | ICD-10-CM | POA: Diagnosis not present

## 2024-04-16 DIAGNOSIS — M545 Low back pain, unspecified: Secondary | ICD-10-CM

## 2024-04-16 DIAGNOSIS — M48062 Spinal stenosis, lumbar region with neurogenic claudication: Secondary | ICD-10-CM | POA: Diagnosis not present

## 2024-04-16 DIAGNOSIS — I10 Essential (primary) hypertension: Secondary | ICD-10-CM | POA: Diagnosis not present

## 2024-04-16 MED ORDER — GABAPENTIN 100 MG PO CAPS: 100.0000 mg | ORAL_CAPSULE | Freq: Three times a day (TID) | ORAL | 0 refills | Status: DC

## 2024-04-16 NOTE — Patient Instructions (Signed)
 Gabapentin  100 mg one three times a day.

## 2024-04-16 NOTE — Progress Notes (Unsigned)
 Acute Office Visit  Subjective:    Patient ID: Patricia Leonard, female    DOB: [birth year], 73 y.o.   MRN: [id number]  Chief Complaint  Patient presents with   Back Pain    HPI: Patient is in today for back pain rates pain 8/10. Seen orthopedics 7/16 and they scheduled her for a spinal injection mid August. She stated she needs some relief now and would like to get the injection done at [facility].  Failed on flexeril , prednisone , tylenol . Oxycodone made her very sedated and we gave narcan  in the office and it brought her back to improved alertness.    Discussed the use of AI scribe software for clinical note transcription with the patient, who gave verbal consent to proceed.  History of Present Illness  Patricia Leonard is a 73 year old female who presents with severe pain and nerve pain following physical therapy.  Severe musculoskeletal and neuropathic pain - Severe pain rated 8/10, worsened by weight-bearing on the leg, making ambulation difficult - Pain increased following physical therapy, including pool therapy - New onset of nerve pain described as 'nerve jabbing' in the big toe and fingertips - History of toes previously drawing - Limited relief from Tylenol , ibuprofen, and oxycodone - Significant constipation with oxycodone use - Symptoms significantly impact daily activities  Constipation and hemorrhoidal symptoms - Severe, large external hemorrhoids; aware of internal hemorrhoid from prior colonoscopy - Uses enemas and suppositories to facilitate bowel movements - Recently started Miralax once daily for constipation - Wishes to avoid long-term pain medication due to constipation  Hypertension - History of episodic elevated blood pressure, particularly during pain exacerbations - Currently taking diltiazem for blood pressure management  Neuropathic pain management - Previous use of gabapentin  for nerve pain with beneficial effect - Has leftover  gabapentin  from 2022 and is considering use despite expiration  Chronic back pain - History of back pain with bulging discs - Prior successful response to steroid injections for back pain      Past Medical History:  Diagnosis Date   Allergy    Anemia    Aneurysm of ascending aorta without rupture (HCC)    Anxiety    Aortic atherosclerosis (HCC)    Arthritis    Cataract    Depression    Family history of colon cancer    Fibromyalgia    GERD (gastroesophageal reflux disease)    Hyperlipidemia    IBS (irritable bowel syndrome)    Moderate recurrent major depression (HCC) 08/05/2021   OSA (obstructive sleep apnea)    Rectal prolapse    Stomach ulcer    Tick bite    took antibiotics memorial day weekend 2021 on back    Past Surgical History:  Procedure Laterality Date   APPENDECTOMY     CESAREAN SECTION     CHOLECYSTECTOMY     COLONOSCOPY  04/18/2014   Diverticulosis.    ESOPHAGOGASTRODUODENOSCOPY  04/14/2016   Schatzkis ring. Hiatal hernia. Gastritis.    HERNIA REPAIR  11/07/2021   Hiatal   LIPOMA EXCISION     NISSEN FUNDOPLICATION  11/07/2021   [facility]   thermal ablation     TONSILLECTOMY      Family History  Problem Relation Age of Onset   Colon cancer Mother    Colon cancer Father    Liver cancer Father    CAD Father    Hypertension Sister    Hyperlipidemia Brother    Gout  Brother    Cancer Maternal Aunt        female cancer, not breast   Cancer Paternal Aunt        female cancer, not breast   Diabetes Maternal Grandmother    Diabetes Paternal Grandmother    Heart attack Other    Cancer Other        Leiomyosarcome and Liver   Migraines Other    Colon polyps Neg Hx    Esophageal cancer Neg Hx    Pancreatic cancer Neg Hx    Rectal cancer Neg Hx    Stomach cancer Neg Hx     Social History   Socioeconomic History   Marital status: Widowed    Spouse name: Not on file   Number of children: 2   Years of  education: Not on file   Highest education level: High school graduate  Occupational History   Not on file  Tobacco Use   Smoking status: Never   Smokeless tobacco: Never  Vaping Use   Vaping status: Never Used  Substance and Sexual Activity   Alcohol use: Never   Drug use: Never   Sexual activity: Not Currently  Other Topics Concern   Not on file  Social History Narrative   Not on file   Social Drivers of Health   Financial Resource Strain: Medium Risk (04/10/2023)   Overall Financial Resource Strain (CARDIA)    Difficulty of Paying Living Expenses: Somewhat hard  Food Insecurity: No Food Insecurity (04/16/2024)   Hunger Vital Sign    Worried About Running Out of Food in the Last Year: Never true    Ran Out of Food in the Last Year: Never true  Transportation Needs: No Transportation Needs (04/16/2024)   PRAPARE - Administrator, Civil Service (Medical): No    Lack of Transportation (Non-Medical): No  Physical Activity: Insufficiently Active (04/10/2023)   Exercise Vital Sign    Days of Exercise per Week: 7 days    Minutes of Exercise per Session: 20 min  Stress: No Stress Concern Present (04/10/2023)   [employer] of Occupational Health - Occupational Stress Questionnaire    Feeling of Stress : Only a little  Social Connections: Moderately Integrated (04/10/2023)   Social Connection and Isolation Panel    Frequency of Communication with Friends and Family: Once a week    Frequency of Social Gatherings with Friends and Family: Three times a week    Attends Religious Services: More than 4 times per year    Active Member of Clubs or Organizations: Yes    Attends Banker Meetings: More than 4 times per year    Marital Status: Widowed  Intimate Partner Violence: Not At Risk (04/16/2024)   Humiliation, Afraid, Rape, and Kick questionnaire    Fear of Current or Ex-Partner: No    Emotionally Abused: No    Physically Abused: No    Sexually Abused: No     Outpatient Medications Prior to Visit  Medication Sig Dispense Refill   aspirin EC 81 MG tablet Take 81 mg by mouth daily. Swallow whole.     Biotin 10 MG CAPS Take 10 mg by mouth daily.     calcium -vitamin D  (OSCAL WITH D) 500-200 MG-UNIT TABS tablet Take by mouth.     clotrimazole-betamethasone (LOTRISONE) cream Apply 1 Application topically 2 (two) times daily as needed.     cyanocobalamin  1000 MCG tablet Take 1,000 mcg by mouth daily.     dicyclomine  (BENTYL )  10 MG capsule Take 1 capsule (10 mg total) by mouth 3 (three) times daily before meals. As needed (Patient taking differently: Take 10 mg by mouth 3 (three) times daily as needed for spasms. As needed) 90 capsule 8   diltiazem (CARDIZEM CD) 120 MG 24 hr capsule Take 120 mg by mouth daily.     ketoconazole  (NIZORAL ) 2 % cream Apply to lower abdominal rash (Patient taking differently: Apply 1 Application topically daily as needed for irritation. Apply to lower abdominal rash) 15 g 0   magnesium hydroxide (MILK OF MAGNESIA) 400 MG/5ML suspension Take 15 mLs by mouth daily as needed for mild constipation.     metoCLOPramide  (REGLAN ) 5 MG tablet Take 1 tablet (5 mg total) by mouth every 8 (eight) hours as needed for nausea or vomiting. 60 tablet 1   Omega-3 Fatty Acids (FISH OIL PO) Take 1 tablet by mouth daily at 6 (six) AM.     ondansetron  (ZOFRAN ) 4 MG tablet Take 1 tablet (4 mg total) by mouth every 8 (eight) hours as needed for nausea or vomiting. 20 tablet 4   pantoprazole  (PROTONIX ) 40 MG tablet Take 1 tablet (40 mg total) by mouth daily. 90 tablet 2   pyridoxine (B-6) 100 MG tablet Take 100 mg by mouth daily.     Vitamin D , Ergocalciferol , (DRISDOL ) 1.25 MG (50000 UNIT) CAPS capsule Take 50,000 Units by mouth every 7 (seven) days.     cyclobenzaprine  (FLEXERIL ) 5 MG tablet Take 5 mg by mouth.     naloxone  (NARCAN ) nasal spray 4 mg/0.1 mL Given during visit 1 each 0   predniSONE  (DELTASONE ) 50 MG tablet Take 1 tablet (50 mg total)  by mouth daily with breakfast. 5 tablet 0   No facility-administered medications prior to visit.    Allergies  Allergen Reactions   Amoxicillin Other (See Comments)   Clavulanic Acid Other (See Comments)   Statins     Muscle pain   Amoxicillin-Pot Clavulanate Nausea Only and Other (See Comments)    Stomach pain  Augmentin   Gadolinium Derivatives Rash   Iodinated Contrast Media Other (See Comments) and Rash     Desc: HAD FACIAL RASH WITH CT CONTRAST IN 2006 HAD FACIAL RASH WITH CT CONTRAST IN 2006  Desc: HAD FACIAL RASH WITH CT CONTRAST IN 2006    Iohexol Rash and Other (See Comments)    HAD FACIAL RASH WITH CT CONTRAST IN 2006  iohexol    Review of Systems  Constitutional:  Negative for chills, fatigue and fever.  HENT:  Negative for congestion, ear pain, rhinorrhea and sore throat.   Respiratory:  Negative for cough and shortness of breath.   Cardiovascular:  Negative for chest pain.  Gastrointestinal:  Negative for abdominal pain, constipation, diarrhea, nausea and vomiting.  Genitourinary:  Negative for dysuria and urgency.  Musculoskeletal:  Positive for back pain. Negative for myalgias.  Neurological:  Negative for dizziness, weakness, light-headedness and headaches.  Psychiatric/Behavioral:  Negative for dysphoric mood. The patient is not nervous/anxious.        Objective:        04/16/2024    1:37 PM 03/20/2024   10:57 AM 03/20/2024    9:27 AM  Vitals with BMI  Height 5' 6  5' 6  Weight 213 lbs  212 lbs  BMI 34.4  34.23  Systolic 124 102 96  Diastolic 68 58 60  Pulse 87  71    No data found.   Physical Exam Vitals reviewed.  Constitutional:      Appearance: Normal appearance.  Cardiovascular:     Rate and Rhythm: Normal rate and regular rhythm.     Heart sounds: Normal heart sounds.  Pulmonary:     Effort: Pulmonary effort is normal. No respiratory distress.     Breath sounds: Normal breath sounds.  Abdominal:     General: Abdomen is flat.  Bowel sounds are normal.     Palpations: Abdomen is soft.     Tenderness: There is no abdominal tenderness.  Musculoskeletal:     Comments: Right SLR.   Neurological:     Mental Status: She is alert and oriented to person, place, and time.  Psychiatric:        Mood and Affect: Mood normal.        Behavior: Behavior normal.     Health Maintenance Due  Topic Date Due   DTaP/Tdap/Td (1 - Tdap) Never done   Zoster Vaccines- Shingrix (1 of 2) Never done   Pneumococcal Vaccine: 50+ Years (2 of 2 - PCV) 10/12/2019   Medicare Annual Wellness (AWV)  04/10/2024    There are no preventive care reminders to display for this patient.   Lab Results  Component Value Date   TSH 2.120 02/12/2024   Lab Results  Component Value Date   WBC 4.4 02/12/2024   HGB 14.1 02/12/2024   HCT 43.6 02/12/2024   MCV 93 02/12/2024   PLT 204 02/12/2024   Lab Results  Component Value Date   NA 141 02/12/2024   K 4.5 02/12/2024   CO2 22 02/12/2024   GLUCOSE 89 02/12/2024   BUN 12 02/12/2024   CREATININE 0.79 02/12/2024   BILITOT 0.4 02/12/2024   ALKPHOS 97 02/12/2024   AST 17 02/12/2024   ALT 14 02/12/2024   PROT 6.4 02/12/2024   ALBUMIN 4.0 02/12/2024   CALCIUM  9.3 02/12/2024   EGFR 78 02/12/2024   GFR 65.95 01/19/2020   Lab Results  Component Value Date   CHOL 196 02/12/2024   Lab Results  Component Value Date   HDL 55 02/12/2024   Lab Results  Component Value Date   LDLCALC 122 (H) 02/12/2024   Lab Results  Component Value Date   TRIG 108 02/12/2024   Lab Results  Component Value Date   CHOLHDL 3.6 02/12/2024   Lab Results  Component Value Date   HGBA1C 5.9 (H) 02/12/2024       Assessment & Plan:  Spinal stenosis of lumbar region with neurogenic claudication Assessment & Plan: Severe pain with neuropathic symptoms in big toe and fingertips. Previous bulging discs responded to steroid injections. Limited success with physical therapy, Tylenol , and muscle relaxants.  Oxycodone effective but caused sedation and constipation. Gabapentin  considered suitable for neuropathic pain. - Prescribe gabapentin , starting at 100 mg once daily at night, with option to increase to three times daily if tolerated. - Monitor for side effects such as sedation and dizziness. - Contact clinic if pain management is inadequate or if side effects occur. - Discuss potential for steroid injections and need for orthopedic follow-up. - Contact clinic by Friday if no update on pain management consultation. - Inquire if upcoming appointment will include discussion or administration of steroid injection.  Orders: -     Gabapentin ; Take 1 capsule (100 mg total) by mouth 3 (three) times daily.  Dispense: 90 capsule; Refill: 0  Irritable bowel syndrome with constipation Assessment & Plan: Constipation likely exacerbated by opioid use. Using enemas, suppositories, and Miralax once  daily. - Increase Miralax to twice daily if needed. - Stay close to home after taking Miralax due to potential for sudden bowel movements.   Benign essential hypertension Assessment & Plan: Episodes of elevated blood pressure during severe pain. Blood pressure normal during visit. Currently taking diltiazem. - Continue diltiazem regimen. - Monitor blood pressure during pain episodes and report significant changes.      Meds ordered this encounter  Medications   gabapentin  (NEURONTIN ) 100 MG capsule    Sig: Take 1 capsule (100 mg total) by mouth 3 (three) times daily.    Dispense:  90 capsule    Refill:  0    No orders of the defined types were placed in this encounter.    Follow-up: No follow-ups on file.  An After Visit Summary was printed and given to the patient.   LILLETTE Kato I Leal-Borjas,acting as a scribe for Abigail Free, MD.,have documented all relevant documentation on the behalf of Abigail Free, MD,as directed by  Abigail Free, MD while in the presence of Abigail Free, MD.   Abigail Free,  MD [facility] [phone]

## 2024-04-16 NOTE — Telephone Encounter (Signed)
 Done

## 2024-04-16 NOTE — Telephone Encounter (Signed)
 I spoke with Amber, PAC. She is working on moving up her back injection either through [facility] or through [facility]. Dr. Sherre

## 2024-04-17 ENCOUNTER — Ambulatory Visit

## 2024-04-19 NOTE — Assessment & Plan Note (Signed)
 Severe pain with neuropathic symptoms in big toe and fingertips. Previous bulging discs responded to steroid injections. Limited success with physical therapy, Tylenol , and muscle relaxants. Oxycodone effective but caused sedation and constipation. Gabapentin  considered suitable for neuropathic pain. - Prescribe gabapentin , starting at 100 mg once daily at night, with option to increase to three times daily if tolerated. - Monitor for side effects such as sedation and dizziness. - Contact clinic if pain management is inadequate or if side effects occur. - Discuss potential for steroid injections and need for orthopedic follow-up. - Contact clinic by Friday if no update on pain management consultation. - Inquire if upcoming appointment will include discussion or administration of steroid injection.

## 2024-04-19 NOTE — Assessment & Plan Note (Signed)
 Constipation likely exacerbated by opioid use. Using enemas, suppositories, and Miralax once daily. - Increase Miralax to twice daily if needed. - Stay close to home after taking Miralax due to potential for sudden bowel movements.

## 2024-04-19 NOTE — Assessment & Plan Note (Signed)
 Episodes of elevated blood pressure during severe pain. Blood pressure normal during visit. Currently taking diltiazem. - Continue diltiazem regimen. - Monitor blood pressure during pain episodes and report significant changes.

## 2024-04-28 DIAGNOSIS — H40053 Ocular hypertension, bilateral: Secondary | ICD-10-CM | POA: Diagnosis not present

## 2024-04-28 DIAGNOSIS — H52223 Regular astigmatism, bilateral: Secondary | ICD-10-CM | POA: Diagnosis not present

## 2024-04-28 DIAGNOSIS — H524 Presbyopia: Secondary | ICD-10-CM | POA: Diagnosis not present

## 2024-04-28 DIAGNOSIS — H2513 Age-related nuclear cataract, bilateral: Secondary | ICD-10-CM | POA: Diagnosis not present

## 2024-04-30 ENCOUNTER — Telehealth: Payer: Self-pay

## 2024-04-30 ENCOUNTER — Other Ambulatory Visit

## 2024-04-30 NOTE — Telephone Encounter (Signed)
 Phone call to patient to review instructions for 13 hr prep for Epi Injection w/ CT contrast on 05/05/24 at 11:30. There is a note from 04/29/21 stating Dr. Derrill ok with doing injection without premedicating with Prednisone , using Benadryl  only.    Pt to take Benadryl  50 mg PO on 05/05/24 @10 :30AM. Pt aware.

## 2024-05-02 NOTE — Discharge Instructions (Addendum)
 Post Procedure Spinal Discharge Instruction Sheet  You may resume a regular diet and any medications that you routinely take (including pain medications) unless otherwise noted by MD.  No driving day of procedure.  Light activity throughout the rest of the day.  Do not do any strenuous work, exercise, bending or lifting.  The day following the procedure, you can resume normal physical activity but you should refrain from exercising or physical therapy for at least three days thereafter.  You may apply ice to the injection site, 20 minutes on, 20 minutes off, as needed. Do not apply ice directly to skin.    Common Side Effects:  Headaches- take your usual medications as directed by your physician.  Increase your fluid intake.  Caffeinated beverages may be helpful.  Lie flat in bed until your headache resolves.  Restlessness or inability to sleep- you may have trouble sleeping for the next few days.  Ask your referring physician if you need any medication for sleep.  Facial flushing or redness- should subside within a few days.  Increased pain- a temporary increase in pain a day or two following your procedure is not unusual.  Take your pain medication as prescribed by your referring physician.  Leg cramps  Please contact our office at [phone] for the following symptoms: Fever greater than 100 degrees. Headaches unresolved with medication after 2-3 days. Increased swelling, pain, or redness at injection site.  May Resume Aspirin    Thank you for visiting [facility] today.

## 2024-05-05 ENCOUNTER — Ambulatory Visit
Admission: RE | Admit: 2024-05-05 | Discharge: 2024-05-05 | Disposition: A | Discharge status: Home / Self Care | Source: Ambulatory Visit | Attending: Orthopedic Surgery | Admitting: Orthopedic Surgery

## 2024-05-05 DIAGNOSIS — M545 Low back pain, unspecified: Secondary | ICD-10-CM

## 2024-05-05 DIAGNOSIS — M4726 Other spondylosis with radiculopathy, lumbar region: Secondary | ICD-10-CM | POA: Diagnosis not present

## 2024-05-05 MED ORDER — IOPAMIDOL (ISOVUE-M 200) INJECTION 41%
1.0000 mL | Freq: Once | INTRAMUSCULAR | Status: AC
  Administered 2024-05-05 (×2): 1 mL via EPIDURAL

## 2024-05-05 MED ORDER — METHYLPREDNISOLONE ACETATE 40 MG/ML INJ SUSP (RADIOLOG
80.0000 mg | Freq: Once | INTRAMUSCULAR | Status: AC
  Administered 2024-05-05 (×2): 80 mg via EPIDURAL

## 2024-05-20 DIAGNOSIS — M4726 Other spondylosis with radiculopathy, lumbar region: Secondary | ICD-10-CM | POA: Diagnosis not present

## 2024-05-20 DIAGNOSIS — M461 Sacroiliitis, not elsewhere classified: Secondary | ICD-10-CM | POA: Diagnosis not present

## 2024-05-20 DIAGNOSIS — M9903 Segmental and somatic dysfunction of lumbar region: Secondary | ICD-10-CM | POA: Diagnosis not present

## 2024-05-20 DIAGNOSIS — M7918 Myalgia, other site: Secondary | ICD-10-CM | POA: Diagnosis not present

## 2024-05-20 DIAGNOSIS — M9902 Segmental and somatic dysfunction of thoracic region: Secondary | ICD-10-CM | POA: Diagnosis not present

## 2024-05-20 DIAGNOSIS — M9905 Segmental and somatic dysfunction of pelvic region: Secondary | ICD-10-CM | POA: Diagnosis not present

## 2024-05-22 ENCOUNTER — Encounter: Payer: Self-pay | Admitting: Family Medicine

## 2024-05-22 ENCOUNTER — Ambulatory Visit: Admitting: Family Medicine

## 2024-05-22 VITALS — BP 130/76 | HR 62 | Temp 98.0°F | Ht 66.0 in | Wt 211.0 lb

## 2024-05-22 DIAGNOSIS — M79644 Pain in right finger(s): Secondary | ICD-10-CM

## 2024-05-22 DIAGNOSIS — F411 Generalized anxiety disorder: Secondary | ICD-10-CM | POA: Diagnosis not present

## 2024-05-22 DIAGNOSIS — R0789 Other chest pain: Secondary | ICD-10-CM | POA: Diagnosis not present

## 2024-05-22 DIAGNOSIS — E782 Mixed hyperlipidemia: Secondary | ICD-10-CM

## 2024-05-22 DIAGNOSIS — R7303 Prediabetes: Secondary | ICD-10-CM | POA: Diagnosis not present

## 2024-05-22 DIAGNOSIS — M5431 Sciatica, right side: Secondary | ICD-10-CM

## 2024-05-22 DIAGNOSIS — K2100 Gastro-esophageal reflux disease with esophagitis, without bleeding: Secondary | ICD-10-CM | POA: Diagnosis not present

## 2024-05-22 DIAGNOSIS — N39498 Other specified urinary incontinence: Secondary | ICD-10-CM

## 2024-05-22 DIAGNOSIS — H6122 Impacted cerumen, left ear: Secondary | ICD-10-CM

## 2024-05-22 DIAGNOSIS — J0100 Acute maxillary sinusitis, unspecified: Secondary | ICD-10-CM

## 2024-05-22 LAB — POCT URINALYSIS DIP (CLINITEK)
Bilirubin, UA: NEGATIVE
Blood, UA: NEGATIVE
Glucose, UA: NEGATIVE mg/dL
Ketones, POC UA: NEGATIVE mg/dL
Nitrite, UA: NEGATIVE
POC PROTEIN,UA: NEGATIVE
Spec Grav, UA: 1.015 (ref 1.010–1.025)
Urobilinogen, UA: 0.2 U/dL
pH, UA: 7 (ref 5.0–8.0)

## 2024-05-22 MED ORDER — ESOMEPRAZOLE MAGNESIUM 40 MG PO CPDR: 40.0000 mg | DELAYED_RELEASE_CAPSULE | Freq: Every day | ORAL | 1 refills | Status: DC

## 2024-05-22 MED ORDER — BUSPIRONE HCL 7.5 MG PO TABS: 7.5000 mg | ORAL_TABLET | Freq: Two times a day (BID) | ORAL | 2 refills | Status: DC

## 2024-05-22 MED ORDER — DOXYCYCLINE HYCLATE 100 MG PO TABS: 100.0000 mg | ORAL_TABLET | Freq: Two times a day (BID) | ORAL | 0 refills | Status: DC

## 2024-05-22 MED ORDER — DILTIAZEM HCL ER COATED BEADS 120 MG PO CP24: 120.0000 mg | ORAL_CAPSULE | Freq: Every day | ORAL | 1 refills | Status: AC

## 2024-05-22 NOTE — Patient Instructions (Signed)
  VISIT SUMMARY: Today, we addressed several of your health concerns, including nerve sensations, sciatica, gastrointestinal issues, ear discomfort, and more. We have made some changes to your medications and planned further tests to better understand your symptoms.  YOUR PLAN: CHEST PAIN (LEFT UPPER BACK RADIATING TO LEFT AXILLA):  - EKG mildly low heart rate  SCIATICA (LUMBAR RADICULOPATHY): You have chronic sciatica with pain radiating down your leg. Previous nerve block injections have helped. -Start taking gabapentin  100 mg three times a day . -Discuss with Dr. Burnetta.   GASTROESOPHAGEAL REFLUX DISEASE WITH GASTRIC ULCER: You have GERD and a history of stomach ulcers. Protonix  is causing diarrhea. -Switch from Protonix  to Nexium  40 mg daily   HYPERLIPIDEMIA (ELEVATED LDL): Your LDL cholesterol levels are mildly elevated. -We will reassess your LDL cholesterol in three months.  PREDIABETES: Your A1c level is at 5.9%, indicating prediabetes. -Continue monitoring your blood sugar levels and maintain a healthy diet and exercise routine.  ANXIETY: You have intermittent numbness and tingling in your arms and around your mouth, likely related to anxiety. -Practice relaxation techniques and consider speaking with a mental health professional if symptoms persist. - Start on buspirone  7.5 mg twice daily.   RIGHT FOURTH FINGER DISTAL PHALANX PAIN: You have pain and redness at the tip of your right fourth finger. The cause is unclear but not dangerous. -Monitor the pain and redness. If it worsens, please let us  know. - recommend diclofenac (voltaren) gel otc.  RIGHT EAR DISCOMFORT AND PRURITUS: You have intermittent discomfort and itching in your right ear. Your left ear is full of wax. -Stop using sweet oil in your right ear, but use in left ear to help with wax.   Sinusitis: Antibiotic: Doxycycline  100 mg twice daily x 10 days sent.  GENERAL HEALTH MAINTENANCE: You are due for a  pneumonia vaccination. -We will administer the pneumonia vaccination today.                      Contains text generated by [employer].                                 Contains text generated by [employer].

## 2024-05-22 NOTE — Progress Notes (Unsigned)
 Subjective:  Patient ID: Patricia Leonard, female    DOB: 06-04-1951  Age: 73 y.o. MRN: [id number]  Chief Complaint  Patient presents with  . Medical Management of Chronic Issues    HPI: Discussed the use of AI scribe software for clinical note transcription with the patient, who gave verbal consent to proceed.  History of Present Illness  Patricia Leonard is a 73 year old female who presents with nerve sensations in her arms and lips.  Paresthesias and neuropathic sensations - Intermittent nerve sensations described as 'nerve ending feelings' in arms and lips - Sensations associated with anxiety - Similar sensations have previously occurred in legs  Lumbosacral radiculopathy and sciatica - History of sciatica with pain radiating down the back of the leg and lower back pain - Received two injections for back pain with good effect - Most recent ESI did not help. - Current pain extends to lower leg and foot - Not taking pain medication recently, prefers to use ice for relief - Has not filled prescription for gabapentin  - Intends to call Dr. Burnetta to get follow up.   Digital pain and erythema - Pain in right fourth finger, particularly at the tip - Tip of finger is red and sore - Uncertain etiology, does not appear related to underlying arthritis  Gastrointestinal symptoms and medication intolerance - History of stomach ulcers, unable to take NSAIDs - Prescribed Protonix , but experiences diarrhea as a side effect - Previously used omeprazole  and acid fix - Considering trial of Nexium  - Takes Bentyl  and Reglan  for gastroparesis  Otolaryngologic symptoms - Ear discomfort, particularly in right ear, described as itchy and with sensation of something needing to come out - Uses sweet oil in right ear - Sinus pressure, especially behind right eye - Experiencing drainage and coughing  Musculoskeletal chest and back pain - Episode of upper back pain under left shoulder blade  after cleaning garage - Pain associated with burping - Pain resolved after a few days  Palpitations - Intermittent palpitations - Uncertain if related to indigestion  History of pulmonary embolism - History of blood clot in lungs following surgery two years ago - Concerned about risk of recurrence with future surgeries    Urinary incontinence: Sometime no awareness that she has to go and is incontinence. Sometime has the urge to go and she cannot get there.        02/12/2024    8:42 AM 11/13/2023    8:13 AM 08/01/2023    1:32 PM 04/30/2023    1:29 PM 04/11/2023    3:49 PM  Depression screen PHQ 2/9  Decreased Interest 0  1 1 1   Down, Depressed, Hopeless 0 0 1 0 1  PHQ - 2 Score 0 0 2 1 2   Altered sleeping 1 2 1 2 2   Tired, decreased energy 1 1 1 1 1   Change in appetite 0 0 1 2 1   Feeling bad or failure about yourself  0 0 0 0 0  Trouble concentrating 0 0 0 1 1  Moving slowly or fidgety/restless 0 0 0 0 0  Suicidal thoughts 0 0 0 0 0  PHQ-9 Score 2 3 5 7 7   Difficult doing work/chores Not difficult at all Not difficult at all Somewhat difficult Not difficult at all Not difficult at all        02/12/2024    8:42 AM  Fall Risk   Falls in the past year? 1  Number falls in past yr:  0  Injury with Fall? 0  Risk for fall due to : No Fall Risks  Follow up Falls evaluation completed    Patient Care Team: Sherre Clapper, MD as PCP - General (Family Medicine) Charlanne Groom, MD as Consulting Physician (Gastroenterology) Mat Browning, MD as Consulting Physician (Obstetrics and Gynecology) Raylene Debby MATSU., MD as Referring Physician (Cardiology)   Review of Systems  Current Outpatient Medications on File Prior to Visit  Medication Sig Dispense Refill  . aspirin EC 81 MG tablet Take 81 mg by mouth daily. Swallow whole.    . Biotin 10 MG CAPS Take 10 mg by mouth daily.    . calcium -vitamin D  (OSCAL WITH D) 500-200 MG-UNIT TABS tablet Take by mouth.    .  clotrimazole-betamethasone (LOTRISONE) cream Apply 1 Application topically 2 (two) times daily as needed.    . cyanocobalamin  1000 MCG tablet Take 1,000 mcg by mouth daily.    . dicyclomine  (BENTYL ) 10 MG capsule Take 1 capsule (10 mg total) by mouth 3 (three) times daily before meals. As needed (Patient taking differently: Take 10 mg by mouth 3 (three) times daily as needed for spasms. As needed) 90 capsule 8  . ketoconazole  (NIZORAL ) 2 % cream Apply to lower abdominal rash (Patient taking differently: Apply 1 Application topically daily as needed for irritation. Apply to lower abdominal rash) 15 g 0  . magnesium  hydroxide (MILK OF MAGNESIA) 400 MG/5ML suspension Take 15 mLs by mouth daily as needed for mild constipation.    . metoCLOPramide  (REGLAN ) 5 MG tablet Take 1 tablet (5 mg total) by mouth every 8 (eight) hours as needed for nausea or vomiting. 60 tablet 1  . Omega-3 Fatty Acids (FISH OIL PO) Take 1 tablet by mouth daily at 6 (six) AM.    . ondansetron  (ZOFRAN ) 4 MG tablet Take 1 tablet (4 mg total) by mouth every 8 (eight) hours as needed for nausea or vomiting. 20 tablet 4  . pyridoxine (B-6) 100 MG tablet Take 100 mg by mouth daily.    . Vitamin D , Ergocalciferol , (DRISDOL ) 1.25 MG (50000 UNIT) CAPS capsule Take 50,000 Units by mouth every 7 (seven) days.     No current facility-administered medications on file prior to visit.   Past Medical History:  Diagnosis Date  . Allergy   . Anemia   . Aneurysm of ascending aorta without rupture (HCC)   . Anxiety   . Aortic atherosclerosis (HCC)   . Arthritis   . Cataract   . Depression   . Family history of colon cancer   . Fibromyalgia   . GERD (gastroesophageal reflux disease)   . Hyperlipidemia   . IBS (irritable bowel syndrome)   . Moderate recurrent major depression (HCC) 08/05/2021  . OSA (obstructive sleep apnea)   . Rectal prolapse   . Stomach ulcer   . Tick bite    took antibiotics memorial day weekend 2021 on back    Past Surgical History:  Procedure Laterality Date  . APPENDECTOMY    . CESAREAN SECTION    . CHOLECYSTECTOMY    . COLONOSCOPY  04/18/2014   Diverticulosis.   SABRA ESOPHAGOGASTRODUODENOSCOPY  04/14/2016   Schatzkis ring. Hiatal hernia. Gastritis.   SABRA HERNIA REPAIR  11/07/2021   Hiatal  . LIPOMA EXCISION    . NISSEN FUNDOPLICATION  11/07/2021   [facility]  . thermal ablation    . TONSILLECTOMY      Family History  Problem Relation Age of  Onset  . Colon cancer Mother   . Colon cancer Father   . Liver cancer Father   . CAD Father   . Hypertension Sister   . Hyperlipidemia Brother   . Gout Brother   . Cancer Maternal Aunt        female cancer, not breast  . Cancer Paternal Aunt        female cancer, not breast  . Diabetes Maternal Grandmother   . Diabetes Paternal Grandmother   . Heart attack Other   . Cancer Other        Leiomyosarcome and Liver  . Migraines Other   . Colon polyps Neg Hx   . Esophageal cancer Neg Hx   . Pancreatic cancer Neg Hx   . Rectal cancer Neg Hx   . Stomach cancer Neg Hx    Social History   Socioeconomic History  . Marital status: Widowed    Spouse name: Not on file  . Number of children: 2  . Years of education: Not on file  . Highest education level: 12th grade  Occupational History  . Not on file  Tobacco Use  . Smoking status: Never  . Smokeless tobacco: Never  Vaping Use  . Vaping status: Never Used  Substance and Sexual Activity  . Alcohol use: Never  . Drug use: Never  . Sexual activity: Not Currently  Other Topics Concern  . Not on file  Social History Narrative  . Not on file   Social Drivers of Health   Financial Resource Strain: Low Risk  (05/21/2024)   Overall Financial Resource Strain (CARDIA)   . Difficulty of Paying Living Expenses: Not hard at all  Food Insecurity: No Food Insecurity (05/21/2024)   Hunger Vital Sign   . Worried About Programme researcher, broadcasting/film/video in the Last Year:  Never true   . Ran Out of Food in the Last Year: Never true  Transportation Needs: No Transportation Needs (05/21/2024)   PRAPARE - Transportation   . Lack of Transportation (Medical): No   . Lack of Transportation (Non-Medical): No  Physical Activity: Inactive (05/21/2024)   Exercise Vital Sign   . Days of Exercise per Week: 0 days   . Minutes of Exercise per Session: Not on file  Stress: No Stress Concern Present (05/21/2024)   [employer] of Occupational Health - Occupational Stress Questionnaire   . Feeling of Stress: Not at all  Social Connections: Moderately Integrated (05/21/2024)   Social Connection and Isolation Panel   . Frequency of Communication with Friends and Family: Three times a week   . Frequency of Social Gatherings with Friends and Family: Three times a week   . Attends Religious Services: More than 4 times per year   . Active Member of Clubs or Organizations: Yes   . Attends Banker Meetings: More than 4 times per year   . Marital Status: Widowed    Objective:  BP 130/76   Pulse 62   Temp 98 F (36.7 C)   Ht 5' 6 (1.676 m)   Wt 211 lb (95.7 kg)   SpO2 100%   BMI 34.06 kg/m      05/22/2024    9:12 AM 05/05/2024   11:57 AM 04/16/2024    1:37 PM  BP/Weight  Systolic BP 130 172 124  Diastolic BP 76 80 68  Wt. (Lbs) 211  213  BMI 34.06 kg/m2  34.38 kg/m2    Physical Exam  {Perform Simple Foot Exam  Perform Detailed exam:1} {Insert foot Exam (Optional):30965}   Lab Results  Component Value Date   WBC 4.4 02/12/2024   HGB 14.1 02/12/2024   HCT 43.6 02/12/2024   PLT 204 02/12/2024   GLUCOSE 89 02/12/2024   CHOL 196 02/12/2024   TRIG 108 02/12/2024   HDL 55 02/12/2024   LDLCALC 122 (H) 02/12/2024   ALT 14 02/12/2024   AST 17 02/12/2024   NA 141 02/12/2024   K 4.5 02/12/2024   CL 103 02/12/2024   CREATININE 0.79 02/12/2024   BUN 12 02/12/2024   CO2 22 02/12/2024   TSH 2.120 02/12/2024   HGBA1C 5.9 (H) 02/12/2024       Assessment & Plan:  Other chest pain Assessment & Plan: Intermittent pain under left shoulder blade radiating to left axilla, associated with burping, post-exertion. Differential includes musculoskeletal pain versus cardiac etiology. - Order EKG to rule out cardiac etiology.  Orders: -     EKG 12-Lead  GAD (generalized anxiety disorder) Assessment & Plan: Intermittent numbness and tingling in arms and around mouth, likely related to anxiety and hyperventilation.  Orders: -     busPIRone  HCl; Take 1 tablet (7.5 mg total) by mouth 2 (two) times daily.  Dispense: 60 tablet; Refill: 2  Acute non-recurrent maxillary sinusitis Assessment & Plan: - Ear discomfort, particularly in right ear, described as itchy and with sensation of something needing to come out - Uses sweet oil in right ear - Sinus pressure, especially behind right eye - Experiencing drainage and coughing Sent Doxycycline .   Hearing loss of left ear due to cerumen impaction Assessment & Plan: Intermittent right ear discomfort and itching. No infection, but ear appears irritated. Left ear full of wax. - Cease use of sweet oil in right ear. - Clean wax from left ear.  Orders: -     Ear Lavage  Other urinary incontinence Assessment & Plan: Check UA Order urine culture.  Orders: -     Doxycycline  Hyclate; Take 1 tablet (100 mg total) by mouth 2 (two) times daily.  Dispense: 180 tablet; Refill: 0 -     POCT URINALYSIS DIP (CLINITEK) -     Urine Culture  Sciatica of right side Assessment & Plan: Chronic sciatica with pain radiating down the back of the leg. Previous nerve block injections provided relief. Gabapentin  prescription available but not yet initiated. - Initiate gabapentin  therapy. - Schedule second nerve block injection.   Right fourth finger distal phalanx pain Assessment & Plan: - Pain in right fourth finger, particularly at the tip - Tip of finger is red and sore - Uncertain etiology,  does not appear related to underlying arthritis   Gastroesophageal reflux disease with esophagitis without hemorrhage Assessment & Plan: GERD symptoms with history of gastric ulcer. Protonix  causing diarrhea. Insurance issues with acid fix. - Switch from Protonix  to Nexium .   Prediabetes Assessment & Plan: Stable.  Continue to work on [employer] and exercise. A1c at 5.9%.    Mixed hyperlipidemia Assessment & Plan: Elevated LDL cholesterol. Previous recommendation for Zetia  declined in favor of natural supplements. - Reassess LDL cholesterol in three months.   Other orders -     dilTIAZem  HCl ER Coated Beads; Take 1 capsule (120 mg total) by mouth daily.  Dispense: 90 capsule; Refill: 1 -     Esomeprazole  Magnesium ; Take 1 capsule (40 mg total) by mouth daily at 12 noon.  Dispense: 90 capsule; Refill: 1      Meds ordered this encounter  Medications  . diltiazem  (CARDIZEM  CD) 120 MG 24 hr capsule    Sig: Take 1 capsule (120 mg total) by mouth daily.    Dispense:  90 capsule    Refill:  1  . esomeprazole  (NEXIUM ) 40 MG capsule    Sig: Take 1 capsule (40 mg total) by mouth daily at 12 noon.    Dispense:  90 capsule    Refill:  1  . doxycycline  (VIBRA -TABS) 100 MG tablet    Sig: Take 1 tablet (100 mg total) by mouth 2 (two) times daily.    Dispense:  180 tablet    Refill:  0  . busPIRone  (BUSPAR ) 7.5 MG tablet    Sig: Take 1 tablet (7.5 mg total) by mouth 2 (two) times daily.    Dispense:  60 tablet    Refill:  2    Orders Placed This Encounter  Procedures  . Urine Culture  . POCT URINALYSIS DIP (CLINITEK)  . EKG 12-Lead  . Ear Lavage     Follow-up: Return in about 4 weeks (around 06/19/2024) for awv with brady, 40 minutes please, chronic follow up (anxiety follow up).   I,Avian Greenawalt,acting as a Neurosurgeon for Abigail Free, MD.,have documented all relevant documentation on the behalf of Abigail Free, MD,as directed by  Abigail Free, MD while in the presence of  Abigail Free, MD.   LILLETTE Kato I Leal-Borjas,acting as a scribe for Abigail Free, MD.,have documented all relevant documentation on the behalf of Abigail Free, MD,as directed by  Abigail Free, MD while in the presence of Abigail Free, MD.    An After Visit Summary was printed and given to the patient.  Abigail Free, MD [facility] [phone]

## 2024-05-24 ENCOUNTER — Telehealth: Payer: Self-pay

## 2024-05-24 DIAGNOSIS — M79644 Pain in right finger(s): Secondary | ICD-10-CM | POA: Insufficient documentation

## 2024-05-24 DIAGNOSIS — R32 Unspecified urinary incontinence: Secondary | ICD-10-CM | POA: Insufficient documentation

## 2024-05-24 DIAGNOSIS — F411 Generalized anxiety disorder: Secondary | ICD-10-CM | POA: Insufficient documentation

## 2024-05-24 DIAGNOSIS — H6122 Impacted cerumen, left ear: Secondary | ICD-10-CM | POA: Insufficient documentation

## 2024-05-24 LAB — URINE CULTURE

## 2024-05-24 NOTE — Assessment & Plan Note (Addendum)
 Sent Doxycycline .

## 2024-05-24 NOTE — Assessment & Plan Note (Deleted)
-   History of stomach ulcers, unable to take NSAIDs - Prescribed Protonix , but experiences diarrhea as a side effect - Previously used omeprazole  and acid fix - Considering trial of Nexium  - Takes Bentyl  and Reglan  for gastroparesis

## 2024-05-24 NOTE — Assessment & Plan Note (Signed)
 Intermittent pain under left shoulder blade radiating to left axilla, associated with burping, post-exertion. Differential includes musculoskeletal pain versus cardiac etiology. - Order EKG to rule out cardiac etiology.

## 2024-05-24 NOTE — Assessment & Plan Note (Signed)
 Stable.  Continue to work on [employer] and exercise. A1c at 5.9%.

## 2024-05-24 NOTE — Assessment & Plan Note (Addendum)
 Intermittent right ear discomfort and itching. No infection, but ear appears irritated. Left ear full of wax. - partial wax removal with irrigation.

## 2024-05-24 NOTE — Assessment & Plan Note (Signed)
 Chronic sciatica with pain radiating down the back of the leg. Previous nerve block injections provided relief. Gabapentin  prescription available but not yet initiated. - Initiate gabapentin  therapy. - Schedule second nerve block injection.

## 2024-05-24 NOTE — Assessment & Plan Note (Signed)
 Intermittent numbness and tingling in arms and around mouth, likely related to anxiety and hyperventilation. Start on buspirone  on 7.5 mg twice daily.

## 2024-05-24 NOTE — Assessment & Plan Note (Signed)
 GERD symptoms with history of gastric ulcer. Protonix  causing diarrhea. Insurance issues with acid fix. - Switch from Protonix  to Nexium .

## 2024-05-24 NOTE — Assessment & Plan Note (Signed)
 Elevated LDL cholesterol. Previous recommendation for Zetia  declined in favor of natural supplements. - Reassess LDL cholesterol in three months.

## 2024-05-24 NOTE — Assessment & Plan Note (Signed)
-   Pain in right fourth finger, particularly at the tip - Tip of finger is red and sore - Uncertain etiology, does not appear related to underlying arthritis

## 2024-05-24 NOTE — Telephone Encounter (Signed)
 Copied from CRM [id number]. Topic: Clinical - Prescription Issue >> May 23, 2024  2:24 PM Patricia Leonard wrote: Pt received a 90 Day supply of doxycycline  (VIBRA -TABS) 100 MG tablet and wanted to clarify the instructions for this medication as she believed she only had to take them for 7-10 business days. [facility] is closed for the day

## 2024-05-24 NOTE — Assessment & Plan Note (Signed)
 Check UA Order urine culture

## 2024-05-27 NOTE — Telephone Encounter (Signed)
 Patient was informed to take medication for 10 days. She verbalized to understand.

## 2024-05-28 ENCOUNTER — Ambulatory Visit: Payer: Self-pay | Admitting: Family Medicine

## 2024-06-03 ENCOUNTER — Other Ambulatory Visit: Payer: Self-pay

## 2024-06-03 ENCOUNTER — Emergency Department (HOSPITAL_BASED_OUTPATIENT_CLINIC_OR_DEPARTMENT_OTHER)
Admission: EM | Admit: 2024-06-03 | Discharge: 2024-06-03 | Disposition: A | Discharge status: Home / Self Care | Attending: Emergency Medicine | Admitting: Emergency Medicine

## 2024-06-03 ENCOUNTER — Emergency Department (HOSPITAL_BASED_OUTPATIENT_CLINIC_OR_DEPARTMENT_OTHER)

## 2024-06-03 ENCOUNTER — Encounter: Payer: Self-pay | Admitting: Family Medicine

## 2024-06-03 ENCOUNTER — Ambulatory Visit (HOSPITAL_BASED_OUTPATIENT_CLINIC_OR_DEPARTMENT_OTHER)
Admission: RE | Admit: 2024-06-03 | Discharge: 2024-06-03 | Disposition: A | Discharge status: Home / Self Care | Source: Ambulatory Visit | Attending: Family Medicine | Admitting: Family Medicine

## 2024-06-03 ENCOUNTER — Ambulatory Visit: Payer: Self-pay

## 2024-06-03 ENCOUNTER — Ambulatory Visit (INDEPENDENT_AMBULATORY_CARE_PROVIDER_SITE_OTHER): Admitting: Family Medicine

## 2024-06-03 ENCOUNTER — Encounter (HOSPITAL_BASED_OUTPATIENT_CLINIC_OR_DEPARTMENT_OTHER): Payer: Self-pay

## 2024-06-03 VITALS — BP 136/80 | HR 87 | Temp 98.4°F | Ht 66.0 in | Wt 209.0 lb

## 2024-06-03 DIAGNOSIS — R079 Chest pain, unspecified: Secondary | ICD-10-CM

## 2024-06-03 DIAGNOSIS — R21 Rash and other nonspecific skin eruption: Secondary | ICD-10-CM | POA: Diagnosis not present

## 2024-06-03 DIAGNOSIS — M546 Pain in thoracic spine: Secondary | ICD-10-CM | POA: Diagnosis not present

## 2024-06-03 DIAGNOSIS — Z7901 Long term (current) use of anticoagulants: Secondary | ICD-10-CM | POA: Insufficient documentation

## 2024-06-03 DIAGNOSIS — R011 Cardiac murmur, unspecified: Secondary | ICD-10-CM | POA: Diagnosis not present

## 2024-06-03 DIAGNOSIS — M549 Dorsalgia, unspecified: Secondary | ICD-10-CM | POA: Diagnosis not present

## 2024-06-03 DIAGNOSIS — Z7982 Long term (current) use of aspirin: Secondary | ICD-10-CM | POA: Diagnosis not present

## 2024-06-03 DIAGNOSIS — Z86711 Personal history of pulmonary embolism: Secondary | ICD-10-CM | POA: Diagnosis not present

## 2024-06-03 DIAGNOSIS — R0781 Pleurodynia: Secondary | ICD-10-CM | POA: Diagnosis not present

## 2024-06-03 DIAGNOSIS — R071 Chest pain on breathing: Secondary | ICD-10-CM | POA: Diagnosis not present

## 2024-06-03 DIAGNOSIS — R0789 Other chest pain: Secondary | ICD-10-CM | POA: Insufficient documentation

## 2024-06-03 DIAGNOSIS — R0602 Shortness of breath: Secondary | ICD-10-CM

## 2024-06-03 DIAGNOSIS — K449 Diaphragmatic hernia without obstruction or gangrene: Secondary | ICD-10-CM | POA: Diagnosis not present

## 2024-06-03 LAB — CBC
HCT: 44.7 % (ref 36.0–46.0)
Hemoglobin: 15.6 g/dL — ABNORMAL HIGH (ref 12.0–15.0)
MCH: 30.7 pg (ref 26.0–34.0)
MCHC: 34.9 g/dL (ref 30.0–36.0)
MCV: 88 fL (ref 80.0–100.0)
Platelets: 240 K/uL (ref 150–400)
RBC: 5.08 MIL/uL (ref 3.87–5.11)
RDW: 11.9 % (ref 11.5–15.5)
WBC: 6.1 K/uL (ref 4.0–10.5)
nRBC: 0 % (ref 0.0–0.2)

## 2024-06-03 LAB — COMPREHENSIVE METABOLIC PANEL WITH GFR
ALT: 16 U/L (ref 0–44)
AST: 23 U/L (ref 15–41)
Albumin: 4.5 g/dL (ref 3.5–5.0)
Alkaline Phosphatase: 106 U/L (ref 38–126)
Anion gap: 14 (ref 5–15)
BUN: 15 mg/dL (ref 8–23)
CO2: 22 mmol/L (ref 22–32)
Calcium: 9.8 mg/dL (ref 8.9–10.3)
Chloride: 101 mmol/L (ref 98–111)
Creatinine, Ser: 0.8 mg/dL (ref 0.44–1.00)
GFR, Estimated: >60 mL/min (ref >60)
Glucose, Bld: 124 mg/dL — ABNORMAL HIGH (ref 70–99)
Potassium: 4.1 mmol/L (ref 3.5–5.1)
Sodium: 138 mmol/L (ref 135–145)
Total Bilirubin: 0.5 mg/dL (ref 0.0–1.2)
Total Protein: 7.5 g/dL (ref 6.5–8.1)

## 2024-06-03 LAB — TROPONIN T, HIGH SENSITIVITY
Troponin T High Sensitivity: <15 ng/L (ref 0–19)
Troponin T High Sensitivity: <15 ng/L (ref 0–19)

## 2024-06-03 MED ORDER — DIPHENHYDRAMINE HCL 50 MG/ML IJ SOLN
50.0000 mg | Freq: Once | INTRAMUSCULAR | Status: DC
Start: 2024-06-03 — End: 2024-06-03

## 2024-06-03 MED ORDER — PREDNISONE 50 MG PO TABS: 50.0000 mg | ORAL_TABLET | Freq: Four times a day (QID) | ORAL | Status: DC

## 2024-06-03 MED ORDER — METHYLPREDNISOLONE SODIUM SUCC 40 MG IJ SOLR
40.0000 mg | Freq: Once | INTRAMUSCULAR | Status: AC
  Administered 2024-06-03: 40 mg via INTRAVENOUS
  Filled 2024-06-03: qty 1

## 2024-06-03 MED ORDER — DIPHENHYDRAMINE HCL 25 MG PO CAPS: 50.0000 mg | ORAL_CAPSULE | Freq: Once | ORAL | Status: AC

## 2024-06-03 MED ORDER — DIPHENHYDRAMINE HCL 25 MG PO CAPS: 50.0000 mg | ORAL_CAPSULE | Freq: Once | ORAL | Status: DC

## 2024-06-03 MED ORDER — DIPHENHYDRAMINE HCL 50 MG/ML IJ SOLN
50.0000 mg | Freq: Once | INTRAMUSCULAR | Status: AC
  Administered 2024-06-03: 50 mg via INTRAVENOUS
  Filled 2024-06-03: qty 1

## 2024-06-03 MED ORDER — IOHEXOL 350 MG/ML SOLN
75.0000 mL | Freq: Once | INTRAVENOUS | Status: AC | PRN
  Administered 2024-06-03: 75 mL via INTRAVENOUS

## 2024-06-03 NOTE — ED Triage Notes (Addendum)
 Was here for a scheduled CT scan of chest for PE, hx of contrast allergy. Was sent here for premedication and emergent CT scan of chest. Has been having right sided rib/chest pain radiating to back, pain worse with deep inspiration. Also c/o right leg pain. Shortness of breath worse with talking.

## 2024-06-03 NOTE — Patient Instructions (Signed)
  VISIT SUMMARY: Today, you were seen for right-sided chest and back pain, occasional cough, fatigue, and gastrointestinal symptoms. We discussed your symptoms and planned further tests to determine the cause of your pain and fatigue.  YOUR PLAN: RIGHT-SIDED CHEST AND BACK PAIN: You have pain in your right side and back that worsens with burping, coughing, or sneezing. This could be due to pleurisy, a blood clot, or pneumonia. -We will order a CT scan of your chest immediately to investigate further. -We will also order a complete blood count to check for any signs of infection or other issues.  FATIGUE: You are experiencing persistent tiredness, which may be related to your heart medications. -We will monitor your fatigue and consider adjusting your heart medications if necessary.

## 2024-06-03 NOTE — Telephone Encounter (Signed)
 FYI Only or Action Required?: FYI only for provider. See note below.  Patient was last seen in primary care on 05/22/2024 by Sherre Clapper, MD.  Called Nurse Triage reporting Advice Only and Pain.  Symptoms began Issues are ongoing .  Interventions attempted: Other: seen by pcp last week.  Symptoms are: unchanged.  Triage Disposition: See HCP Within 4 Hours (Or PCP Triage)  Patient/caregiver understands and will follow disposition?: Yes                    Copied from CRM #[id number]. Topic: Clinical - Red Word Triage >> Jun 03, 2024  9:48 AM Patricia Leonard wrote: [facility] that prompted transfer to Nurse Triage: Pt is experiencing pain in her R side and back going her her spine. Pt said it hurts when she burps, cough, and sneeze. Pt had pain similar to this before and it turned out to be a blood clot and pneumonia. Warm transfer to nurse triage Reason for Disposition . [1] SEVERE back pain (e.g., excruciating, unable to do any normal activities) AND [2] not improved 2 hours after pain medicine  Answer Assessment - Initial Assessment Questions 1. REASON FOR CALL: What is the main reason for your call? or How can I best help you?     Pt is experiencing right side and back pain. 2. SYMPTOMS : Do you have any symptoms?      Right side pain - back pain. Questions about recent medications  Answer Assessment - Initial Assessment Questions Call from pt regarding back/side pain. As in note from [facility]. Pt thinks that pain may be from recently prescribed medications or could be a blood clot or pneumonia. Pt would like provider to write orders for testing so that pt will not have to spend all day at ED for care. Pt is medically complex. Called [facility] and was transferred to Dr. Stacy nurse. Pt will be seen in office this afternoon and will go to ED if condition worsens.         1. ONSET: When did the pain begin? (e.g., minutes, hours, days)     Ongoing  2. LOCATION: Where does it  hurt? (upper, mid or lower back)     Right side and back 3. SEVERITY: How bad is the pain?  (e.g., Scale 1-10; mild, moderate, or severe)     moderate 4. PATTERN: Is the pain constant? (e.g., yes, no; constant, intermittent)      yes 5. RADIATION: Does the pain shoot into your legs or somewhere else?     no 6. CAUSE:  What do you think is causing the back pain?      Unsure  8. MEDICINES: What have you taken so far for the pain? (e.g., nothing, acetaminophen , NSAIDS)     Yes  Protocols used: Information Only Call - No Triage-A-AH, Back Pain-A-AH

## 2024-06-03 NOTE — Discharge Instructions (Addendum)
 Your chest pain is likely musculoskeletal in nature, you did not have any evidence of a pulmonary embolism on your CT scan in the emergency department this evening.  Continue Tylenol  and lidocaine  patches as needed for chest wall pain.  You had findings on imaging consistent with acid reflux, please discuss this with your gastroenterologist at your upcoming appointment to determine your best course of action for management of acid reflux.  Return to the emergency department if your symptoms worsen.  Follow-up with your primary care provider.

## 2024-06-03 NOTE — Progress Notes (Signed)
 Subjective:  Patient ID: Patricia Leonard, female    DOB: [birth year]  Age: 73 y.o. MRN: [id number]  Chief Complaint  Patient presents with   Back Pain     Per triage note: Pt is experiencing pain in her R side and back going her her spine. Pt said it hurts when she burps, cough, and sneeze. Pt had pain similar to this before and it turned out to be a blood clot and pneumonia.    HPI: Discussed the use of AI scribe software for clinical note transcription with the patient, who gave verbal consent to proceed.  History of Present Illness Patricia Leonard is a 73 year old female with a history of blood clots and pneumonia who presents with right-sided and back pain.  Pleuritic chest and back pain - Right-sided and mid-back pain radiating into the spine - Pain onset approximately 4-5 days ago, initially associated with burping - Pain worsens with burping, coughing, sneezing, and deep inspiration - Pain began after burping, with more pronounced pain in the last 2-3 days, especially with deep breaths or coughing - Pain is similar to prior episodes associated with blood clots - Upper back pain under the left shoulder blade is present but distinct from current pain, which is localized to the middle of the back  Respiratory symptoms - Occasional cough, described as needing to clear a 'furball' from the throat - No significant shortness of breath, but discomfort with deep inspiration - No significant amounts of green sputum, but a small amount of green discharge present  Fatigue - Tiredness and lack of energy - Attributes fatigue to heart medications, specifically metoprolol and diltiazem   Gastrointestinal symptoms - Occasional burping, which preceded onset of pain - History of gastroparesis, with recent exacerbation possibly related to doxycycline  use - Discontinued doxycycline  after 5 days due to worsening burping and concern for gastroparesis exacerbation  Medication use and adverse  effects - Occasional use of ibuprofen 200 mg for sciatica       02/12/2024    8:42 AM 11/13/2023    8:13 AM 08/01/2023    1:32 PM 04/30/2023    1:29 PM 04/11/2023    3:49 PM  Depression screen PHQ 2/9  Decreased Interest 0  1 1 1   Down, Depressed, Hopeless 0 0 1 0 1  PHQ - 2 Score 0 0 2 1 2   Altered sleeping 1 2 1 2 2   Tired, decreased energy 1 1 1 1 1   Change in appetite 0 0 1 2 1   Feeling bad or failure about yourself  0 0 0 0 0  Trouble concentrating 0 0 0 1 1  Moving slowly or fidgety/restless 0 0 0 0 0  Suicidal thoughts 0 0 0 0 0  PHQ-9 Score 2 3 5 7 7   Difficult doing work/chores Not difficult at all Not difficult at all Somewhat difficult Not difficult at all Not difficult at all        02/12/2024    8:42 AM  Fall Risk   Falls in the past year? 1  Number falls in past yr: 0  Injury with Fall? 0  Risk for fall due to : No Fall Risks  Follow up Falls evaluation completed    Patient Care Team: Sherre Clapper, MD as PCP - General (Family Medicine) Charlanne Groom, MD as Consulting Physician (Gastroenterology) Mat Browning, MD as Consulting Physician (Obstetrics and Gynecology) Raylene Debby MATSU., MD as Referring Physician (Cardiology)   Review of Systems  Constitutional:  Negative for chills, fatigue and fever.  HENT:  Negative for congestion, ear pain and sore throat.   Respiratory:  Positive for cough (very mild) and shortness of breath.   Cardiovascular:  Positive for chest pain.  Gastrointestinal:  Negative for abdominal pain, constipation, diarrhea, nausea and vomiting.  Skin:  Negative for rash.  Psychiatric/Behavioral:  Negative for dysphoric mood. The patient is not nervous/anxious.     Current Outpatient Medications on File Prior to Visit  Medication Sig Dispense Refill   aspirin EC 81 MG tablet Take 81 mg by mouth daily. Swallow whole.     Biotin 10 MG CAPS Take 10 mg by mouth daily.     busPIRone  (BUSPAR ) 7.5 MG tablet Take 1 tablet (7.5 mg total) by mouth  2 (two) times daily. 60 tablet 2   calcium -vitamin D  (OSCAL WITH D) 500-200 MG-UNIT TABS tablet Take by mouth.     clotrimazole-betamethasone (LOTRISONE) cream Apply 1 Application topically 2 (two) times daily as needed.     cyanocobalamin  1000 MCG tablet Take 1,000 mcg by mouth daily.     dicyclomine  (BENTYL ) 10 MG capsule Take 1 capsule (10 mg total) by mouth 3 (three) times daily before meals. As needed (Patient taking differently: Take 10 mg by mouth 3 (three) times daily as needed for spasms. As needed) 90 capsule 8   diltiazem  (CARDIZEM  CD) 120 MG 24 hr capsule Take 1 capsule (120 mg total) by mouth daily. 90 capsule 1   esomeprazole  (NEXIUM ) 40 MG capsule Take 1 capsule (40 mg total) by mouth daily at 12 noon. 90 capsule 1   ketoconazole  (NIZORAL ) 2 % cream Apply to lower abdominal rash (Patient taking differently: Apply 1 Application topically daily as needed for irritation. Apply to lower abdominal rash) 15 g 0   magnesium  hydroxide (MILK OF MAGNESIA) 400 MG/5ML suspension Take 15 mLs by mouth daily as needed for mild constipation.     metoCLOPramide  (REGLAN ) 5 MG tablet Take 1 tablet (5 mg total) by mouth every 8 (eight) hours as needed for nausea or vomiting. 60 tablet 1   Omega-3 Fatty Acids (FISH OIL PO) Take 1 tablet by mouth daily at 6 (six) AM.     ondansetron  (ZOFRAN ) 4 MG tablet Take 1 tablet (4 mg total) by mouth every 8 (eight) hours as needed for nausea or vomiting. 20 tablet 4   pyridoxine (B-6) 100 MG tablet Take 100 mg by mouth daily.     Vitamin D , Ergocalciferol , (DRISDOL ) 1.25 MG (50000 UNIT) CAPS capsule Take 50,000 Units by mouth every 7 (seven) days.     No current facility-administered medications on file prior to visit.   Past Medical History:  Diagnosis Date   Allergy    Anemia    Aneurysm of ascending aorta without rupture (HCC)    Anxiety    Aortic atherosclerosis (HCC)    Arthritis    Cataract    Depression    Family history of colon cancer     Fibromyalgia    GERD (gastroesophageal reflux disease)    Hyperlipidemia    IBS (irritable bowel syndrome)    Moderate recurrent major depression (HCC) 08/05/2021   OSA (obstructive sleep apnea)    Rectal prolapse    Stomach ulcer    Tick bite    took antibiotics memorial day weekend 2021 on back   Past Surgical History:  Procedure Laterality Date   APPENDECTOMY     CESAREAN SECTION     CHOLECYSTECTOMY  COLONOSCOPY  04/18/2014   Diverticulosis.    ESOPHAGOGASTRODUODENOSCOPY  04/14/2016   Schatzkis ring. Hiatal hernia. Gastritis.    HERNIA REPAIR  11/07/2021   Hiatal   LIPOMA EXCISION     NISSEN FUNDOPLICATION  11/07/2021   [facility]   thermal ablation     TONSILLECTOMY      Family History  Problem Relation Age of Onset   Colon cancer Mother    Colon cancer Father    Liver cancer Father    CAD Father    Hypertension Sister    Hyperlipidemia Brother    Gout Brother    Cancer Maternal Aunt        female cancer, not breast   Cancer Paternal Aunt        female cancer, not breast   Diabetes Maternal Grandmother    Diabetes Paternal Grandmother    Heart attack Other    Cancer Other        Leiomyosarcome and Liver   Migraines Other    Colon polyps Neg Hx    Esophageal cancer Neg Hx    Pancreatic cancer Neg Hx    Rectal cancer Neg Hx    Stomach cancer Neg Hx    Social History   Socioeconomic History   Marital status: Widowed    Spouse name: Not on file   Number of children: 2   Years of education: Not on file   Highest education level: 12th grade  Occupational History   Not on file  Tobacco Use   Smoking status: Never   Smokeless tobacco: Never  Vaping Use   Vaping status: Never Used  Substance and Sexual Activity   Alcohol use: Never   Drug use: Never   Sexual activity: Not Currently  Other Topics Concern   Not on file  Social History Narrative   Not on file   Social Drivers of Health   Financial Resource  Strain: Low Risk  (05/21/2024)   Overall Financial Resource Strain (CARDIA)    Difficulty of Paying Living Expenses: Not hard at all  Food Insecurity: No Food Insecurity (05/21/2024)   Hunger Vital Sign    Worried About Running Out of Food in the Last Year: Never true    Ran Out of Food in the Last Year: Never true  Transportation Needs: No Transportation Needs (05/21/2024)   PRAPARE - Administrator, Civil Service (Medical): No    Lack of Transportation (Non-Medical): No  Physical Activity: Inactive (05/21/2024)   Exercise Vital Sign    Days of Exercise per Week: 0 days    Minutes of Exercise per Session: Not on file  Stress: No Stress Concern Present (05/21/2024)   [employer] of Occupational Health - Occupational Stress Questionnaire    Feeling of Stress: Not at all  Social Connections: Moderately Integrated (05/21/2024)   Social Connection and Isolation Panel    Frequency of Communication with Friends and Family: Three times a week    Frequency of Social Gatherings with Friends and Family: Three times a week    Attends Religious Services: More than 4 times per year    Active Member of Clubs or Organizations: Yes    Attends Banker Meetings: More than 4 times per year    Marital Status: Widowed    Objective:  BP 136/80   Pulse 87   Temp 98.4 F (36.9 C)   Ht 5' 6 (1.676 m)   Wt 209 lb (  94.8 kg)   SpO2 98%   BMI 33.73 kg/m      06/03/2024    1:21 PM 05/22/2024    9:12 AM 05/05/2024   11:57 AM  BP/Weight  Systolic BP 136 130 172  Diastolic BP 80 76 80  Wt. (Lbs) 209 211   BMI 33.73 kg/m2 34.06 kg/m2     Physical Exam Vitals reviewed.  Constitutional:      Appearance: Normal appearance.  Cardiovascular:     Rate and Rhythm: Normal rate and regular rhythm.     Heart sounds: Normal heart sounds.  Pulmonary:     Effort: Pulmonary effort is normal. No respiratory distress.     Breath sounds: Normal breath sounds.     Comments: Pleuritic  chest pain. Chest:     Chest wall: Tenderness present.    Abdominal:     Palpations: Abdomen is soft.     Tenderness: There is no abdominal tenderness.  Musculoskeletal:     Thoracic back: Tenderness present.       Back:  Skin:    Findings: No rash.  Neurological:     Mental Status: She is alert and oriented to person, place, and time.  Psychiatric:        Mood and Affect: Mood normal.        Behavior: Behavior normal.         Lab Results  Component Value Date   WBC 4.4 02/12/2024   HGB 14.1 02/12/2024   HCT 43.6 02/12/2024   PLT 204 02/12/2024   GLUCOSE 89 02/12/2024   CHOL 196 02/12/2024   TRIG 108 02/12/2024   HDL 55 02/12/2024   LDLCALC 122 (H) 02/12/2024   ALT 14 02/12/2024   AST 17 02/12/2024   NA 141 02/12/2024   K 4.5 02/12/2024   CL 103 02/12/2024   CREATININE 0.79 02/12/2024   BUN 12 02/12/2024   CO2 22 02/12/2024   TSH 2.120 02/12/2024   HGBA1C 5.9 (H) 02/12/2024      Assessment & Plan:  Right-sided chest pain -     CT Angio Chest Pulmonary Embolism (PE) W or WO Contrast; Future  Acute right-sided thoracic back pain -     CT Angio Chest Pulmonary Embolism (PE) W or WO Contrast; Future  Shortness of breath -     CT Angio Chest Pulmonary Embolism (PE) W or WO Contrast; Future  History of pulmonary embolism -     CT Angio Chest Pulmonary Embolism (PE) W or WO Contrast; Future     Assessment and Plan Assessment & Plan Right-sided chest and back pain with pleuritic features. History of PE. Pain in the right side and back, exacerbated by burping, coughing, or sneezing. Differential includes pleurisy, blood clot, or pneumonia. Lungs sound clear, suggesting non-infectious etiology. - Order CT scan of the chest stat.  Fatigue Persistent fatigue possibly related to heart medications such as metoprolol.      No orders of the defined types were placed in this encounter.   Orders Placed This Encounter  Procedures   CT Angio Chest W/Cm  &/Or Wo Cm     Follow-up: Return if symptoms worsen or fail to improve.   I,Isiaha Greenup,acting as a Neurosurgeon for Abigail Free, MD.,have documented all relevant documentation on the behalf of Abigail Free, MD,as directed by  Abigail Free, MD while in the presence of Abigail Free, MD.   An After Visit Summary was printed and given to the patient.  Abigail Free, MD [facility] [phone]

## 2024-06-03 NOTE — ED Provider Notes (Signed)
 Patricia [facility] AT [facility] Provider Note   CSN: [id number] Arrival date & time: 06/03/24  1621     Patient presents with: Chest Pain   Patricia Leonard is a 73 y.o. female.   73 year old female presenting with pleuritic chest pain sent by her primary care provider for a CT PE study.  Patient describes several days of not feeling like myself, notes that she is becoming more worn out doing normal tasks she would typically do around the house, she notes some sharp pains under her right breast that is exacerbated by burping/coughing/taking a deep breath.  She was seen by her PCP today who recommended a CT PE study, she does have a history of a pulmonary embolism in 2022, she was on Eliquis  for a period of time however this was discontinued, as it was suspected that her pulmonary embolism was likely a postoperative complication following an emergent hiatal hernia repair.  She denies lower extremity edema, does have chronic right anterior shin pain from a prior injury.  She also endorses back pain, but she attributes this to her chronic degenerative disc disease.  History of allergy to contrast, states that once after receiving contrast media she developed a mild rash that was limited to her face.   Chest Pain      Prior to Admission medications   Medication Sig Start Date End Date Taking? Authorizing Provider  aspirin EC 81 MG tablet Take 81 mg by mouth daily. Swallow whole.    [provider]  Biotin 10 MG CAPS Take 10 mg by mouth daily.    [provider]  busPIRone  (BUSPAR ) 7.5 MG tablet Take 1 tablet (7.5 mg total) by mouth 2 (two) times daily. 05/22/24   CoxAbigail, MD  calcium -vitamin D  (OSCAL WITH D) 500-200 MG-UNIT TABS tablet Take by mouth.    [provider]  clotrimazole-betamethasone (LOTRISONE) cream Apply 1 Application topically 2 (two) times daily as needed. 07/28/20   [provider]  cyanocobalamin  1000 MCG  tablet Take 1,000 mcg by mouth daily.    [provider]  dicyclomine  (BENTYL ) 10 MG capsule Take 1 capsule (10 mg total) by mouth 3 (three) times daily before meals. As needed Patient taking differently: Take 10 mg by mouth 3 (three) times daily as needed for spasms. As needed 01/19/20   Esterwood, Amy S, PA-C  diltiazem  (CARDIZEM  CD) 120 MG 24 hr capsule Take 1 capsule (120 mg total) by mouth daily. 05/22/24 05/22/25  Sherre Abigail, MD  esomeprazole  (NEXIUM ) 40 MG capsule Take 1 capsule (40 mg total) by mouth daily at 12 noon. 05/22/24   CoxAbigail, MD  ketoconazole  (NIZORAL ) 2 % cream Apply to lower abdominal rash Patient taking differently: Apply 1 Application topically daily as needed for irritation. Apply to lower abdominal rash 06/27/21   Charlene Clotilda PARAS, NP  magnesium  hydroxide (MILK OF MAGNESIA) 400 MG/5ML suspension Take 15 mLs by mouth daily as needed for mild constipation.    [provider]  metoCLOPramide  (REGLAN ) 5 MG tablet Take 1 tablet (5 mg total) by mouth every 8 (eight) hours as needed for nausea or vomiting. 10/30/22   Craig Alan SAUNDERS, PA-C  Omega-3 Fatty Acids (FISH OIL PO) Take 1 tablet by mouth daily at 6 (six) AM.    [provider]  ondansetron  (ZOFRAN ) 4 MG tablet Take 1 tablet (4 mg total) by mouth every 8 (eight) hours as needed for nausea or vomiting. 02/12/24   Sherre Abigail,  MD  pyridoxine (B-6) 100 MG tablet Take 100 mg by mouth daily.    [provider]  Vitamin D , Ergocalciferol , (DRISDOL ) 1.25 MG (50000 UNIT) CAPS capsule Take 50,000 Units by mouth every 7 (seven) days.    [provider]    Allergies: Amoxicillin, Clavulanic acid, Pantoprazole , Statins, Amoxicillin-pot clavulanate, Gadolinium derivatives, Iodinated contrast media, and Iohexol     Review of Systems  Cardiovascular:  Positive for chest pain.    Updated Vital Signs  Vitals:   06/03/24 1930 06/03/24 2000 06/03/24 2030 06/03/24 2047  BP: 121/79 (!)  125/94 115/84   Pulse: 63 60 60   Resp: 18 (!) 22 16   Temp:    98.5 F (36.9 C)  TempSrc:    Oral  SpO2: 95% 95% 95%      Physical Exam Vitals and nursing note reviewed.  HENT:     Head: Normocephalic.  Eyes:     Extraocular Movements: Extraocular movements intact.  Cardiovascular:     Rate and Rhythm: Normal rate and regular rhythm.     Pulses: Normal pulses.     Heart sounds: Murmur heard.     Comments: TTP of chest wall in subxiphoid area and below R breast Pulmonary:     Effort: Pulmonary effort is normal.     Breath sounds: Normal breath sounds.     Comments: Pain elicited with deep breathing Abdominal:     Palpations: Abdomen is soft.     Tenderness: There is no abdominal tenderness. There is no guarding.  Musculoskeletal:     Cervical back: Normal range of motion.     Right lower leg: No edema.     Left lower leg: No edema.     Comments: Moves all extremities spontaneously without difficulty  Skin:    General: Skin is warm and dry.  Neurological:     Mental Status: She is alert and oriented to person, place, and time.     (all labs ordered are listed, but only abnormal results are displayed) Labs Reviewed  CBC - Abnormal; Notable for the following components:      Result Value   Hemoglobin 15.6 (*)    All other components within normal limits  COMPREHENSIVE METABOLIC PANEL WITH GFR - Abnormal; Notable for the following components:   Glucose, Bld 124 (*)    All other components within normal limits  TROPONIN T, HIGH SENSITIVITY  TROPONIN T, HIGH SENSITIVITY    EKG: None  Radiology: CT Angio Chest Pulmonary Embolism (PE) W or WO Contrast Result Date: 06/03/2024 EXAM: CTA of the Chest with contrast for PE 06/03/2024 10:09:23 PM TECHNIQUE: CTA of the chest was performed after the administration of intravenous contrast. Multiplanar reformatted images are provided for review. MIP images are provided for review. Automated exposure control, iterative  reconstruction, and/or weight based adjustment of the mA/kV was utilized to reduce the radiation dose to as low as reasonably achievable. COMPARISON: Chest radiograph and CTA chest 03/03/2023 negative for pulmonary embolism. CLINICAL HISTORY: Chest pain with pleuritic features, history of PE. 73 year old female with right-sided rib/chest pain radiating to back, pain worse with deep inspiration. Also complains of right leg pain. History of prior PE. Patient has contrast allergy. Given 4-hour emergent pre-medication protocol. No initial reaction post contrast. RN asked to observe closely for any delayed reaction. FINDINGS: PULMONARY ARTERIES: Pulmonary arteries are adequately opacified for evaluation. No pulmonary embolism. Main pulmonary artery is normal in caliber. MEDIASTINUM: The heart and pericardium demonstrate no acute abnormality. There  is no acute abnormality of the thoracic aorta. LYMPH NODES: No mediastinal, hilar or axillary lymphadenopathy. LUNGS AND PLEURA: The lungs are without acute process. No focal consolidation or pulmonary edema. No pleural effusion or pneumothorax. UPPER ABDOMEN: Small hiatal hernia. Wall thickening about the distal esophagus, which can be seen from the inflammation from reflux. SOFT TISSUES AND BONES: Enlarged heterogeneous thyroid  gland previously evaluated with ultrasound 11/20/2022. No acute bone or soft tissue abnormality. IMPRESSION: 1. No pulmonary embolism. 2. Wall thickening about the distal esophagus, possibly due to inflammation from reflux. Electronically signed by: Norman Gatlin MD 06/03/2024 10:20 PM EDT RP Workstation: [id number]   DG Chest 2 View Result Date: 06/03/2024 CLINICAL DATA:  Chest pain, pleuritic pain radiating to back, worse with inspiration EXAM: CHEST - 2 VIEW COMPARISON:  06/19/2022 FINDINGS: Frontal and lateral views of the chest demonstrate an unremarkable cardiac silhouette. No acute airspace disease, effusion, or pneumothorax. No acute bony  abnormalities. IMPRESSION: 1. No acute intrathoracic process. Electronically Signed   By: Ozell Daring M.D.   On: 06/03/2024 17:46     Procedures   Medications Ordered in the ED  methylPREDNISolone  sodium succinate (SOLU-MEDROL ) 40 mg/mL injection 40 mg (40 mg Intravenous Given 06/03/24 1745)  diphenhydrAMINE  (BENADRYL ) capsule 50 mg ( Oral See Alternative 06/03/24 2050)    Or  diphenhydrAMINE  (BENADRYL ) injection 50 mg (50 mg Intravenous Given 06/03/24 2050)  iohexol  (OMNIPAQUE ) 350 MG/ML injection 75 mL (75 mLs Intravenous Contrast Given 06/03/24 2211)    Clinical Course as of 06/03/24 1749  Tue Jun 03, 2024  1748 Pleuritic lower costal margin discomfort, hx of provoked PE not on thinners  [LS]    Clinical Course User Index [LS] Rogelia Jerilynn RAMAN, MD                                 Medical Decision Making This patient presents to the ED for concern of pleuritic chest pain, this involves an extensive number of treatment options, and is a complaint that carries with it a high risk of complications and morbidity.  The differential diagnosis includes pulmonary embolism, musculoskeletal pain/strain/sprain, costochondritis   Co morbidities that complicate the patient evaluation  History of pulmonary embolism in 2022   Additional history obtained:  Additional history obtained from record review External records from outside source obtained and reviewed including PCP note   Lab Tests:  I Ordered, and personally interpreted labs.  The pertinent results include: CBC notable for mildly elevated hemoglobin at 15.6, otherwise stable from previous.  CMP unremarkable.  Initial troponin <15, repeat remains unchanged.   Imaging Studies ordered:  I ordered imaging studies including CXR, CT PE study  I independently visualized and interpreted imaging which showed  - CXR: 1. No acute intrathoracic process. - CT PE study: 1. No pulmonary embolism. 2. Wall thickening about the distal  esophagus, possibly due to inflammation from reflux.  I agree with the radiologist interpretation   Cardiac Monitoring: / EKG:  The patient was maintained on a cardiac monitor.  I personally viewed and interpreted the cardiac monitored which showed an underlying rhythm of: NSR   Problem List / ED Course / Critical interventions / Medication management  I ordered medication including Benadryl  and prednisone  for premedication due to history of contrast allergy I have reviewed the patients home medicines and have made adjustments as needed   Test / Admission - Considered:  Physical exam is notable as above,  patient was recommended to have a CTA PE study by her primary care provider, as she has chest pain that is pleuritic in nature and does have a history of pulmonary embolism several years ago, she is not currently anticoagulated.  I feel that this is a reasonable plan, unfortunately patient does have a history of contrast allergy resulting in a facial rash, therefore she will be premedicated with Solu-Medrol  and Benadryl  prior to her imaging today.  I examined the patient after administration of contrast media, she does not have any evidence of rash or oropharyngeal swelling, she feels in her normal state of health after receiving contrast in the emergency department this evening.  Workup in the emergency department today is reassuring, see above for lab/imaging interpretation. I suspect that her chest wall pain is likely musculoskeletal in origin, I encouraged her to continue Tylenol /lidocaine  patches as needed for relief of her symptoms.  She does have findings on CT consistent with acid reflux, patient does have a history of gastroparesis and was advised to take Nexium  in the past, however when researching this medication she found that it was contraindicated for use in patients with gastroparesis, therefore she is not taking it.  She does have an appointment with her gastroenterologist in the  coming weeks, I advised her to discuss initiation of possible PPI at that time given findings consistent with reflux as well as increased belching.  She voiced understanding and is in agreement with this plan.  Return precautions discussed.  She is appropriate for discharge at this time.  Staffed with Dr. Rogelia  Amount and/or Complexity of Data Reviewed Labs: ordered. Radiology: ordered.  Risk Prescription drug management.        Final diagnoses:  Chest wall pain    ED Discharge Orders     None          Patricia Leonard 06/03/24 2304    Rogelia Jerilynn RAMAN, MD 06/06/24 [phone]

## 2024-06-16 ENCOUNTER — Other Ambulatory Visit: Payer: Self-pay | Admitting: Family Medicine

## 2024-06-16 DIAGNOSIS — F411 Generalized anxiety disorder: Secondary | ICD-10-CM

## 2024-06-18 DIAGNOSIS — K3184 Gastroparesis: Secondary | ICD-10-CM | POA: Diagnosis not present

## 2024-06-18 DIAGNOSIS — R001 Bradycardia, unspecified: Secondary | ICD-10-CM | POA: Diagnosis not present

## 2024-06-18 DIAGNOSIS — K219 Gastro-esophageal reflux disease without esophagitis: Secondary | ICD-10-CM | POA: Diagnosis not present

## 2024-06-18 DIAGNOSIS — I10 Essential (primary) hypertension: Secondary | ICD-10-CM | POA: Diagnosis not present

## 2024-06-18 DIAGNOSIS — I7121 Aneurysm of the ascending aorta, without rupture: Secondary | ICD-10-CM | POA: Diagnosis not present

## 2024-06-18 DIAGNOSIS — G4733 Obstructive sleep apnea (adult) (pediatric): Secondary | ICD-10-CM | POA: Diagnosis not present

## 2024-06-18 DIAGNOSIS — E041 Nontoxic single thyroid nodule: Secondary | ICD-10-CM | POA: Diagnosis not present

## 2024-06-18 DIAGNOSIS — R002 Palpitations: Secondary | ICD-10-CM | POA: Diagnosis not present

## 2024-06-18 DIAGNOSIS — I4719 Other supraventricular tachycardia: Secondary | ICD-10-CM | POA: Diagnosis not present

## 2024-06-18 DIAGNOSIS — Z86711 Personal history of pulmonary embolism: Secondary | ICD-10-CM | POA: Diagnosis not present

## 2024-06-19 ENCOUNTER — Ambulatory Visit

## 2024-06-19 ENCOUNTER — Ambulatory Visit (INDEPENDENT_AMBULATORY_CARE_PROVIDER_SITE_OTHER): Admitting: Physician Assistant

## 2024-06-19 DIAGNOSIS — R899 Unspecified abnormal finding in specimens from other organs, systems and tissues: Secondary | ICD-10-CM

## 2024-06-19 DIAGNOSIS — Z0000 Encounter for general adult medical examination without abnormal findings: Secondary | ICD-10-CM

## 2024-06-19 DIAGNOSIS — Z1231 Encounter for screening mammogram for malignant neoplasm of breast: Secondary | ICD-10-CM | POA: Diagnosis not present

## 2024-06-19 DIAGNOSIS — R001 Bradycardia, unspecified: Secondary | ICD-10-CM | POA: Diagnosis not present

## 2024-06-19 DIAGNOSIS — E559 Vitamin D deficiency, unspecified: Secondary | ICD-10-CM | POA: Diagnosis not present

## 2024-06-19 MED ORDER — VITAMIN D (ERGOCALCIFEROL) 1.25 MG (50000 UNIT) PO CAPS: 50000.0000 [IU] | ORAL_CAPSULE | ORAL | 0 refills | Status: DC

## 2024-06-19 NOTE — Patient Instructions (Addendum)
 STOP B12 and B6 Until provider gets lab results back.  Appointment tomorrow for flu shot, and labs.

## 2024-06-19 NOTE — Progress Notes (Signed)
 Subjective:   Patricia Leonard is a 73 y.o. female who presents for Medicare Annual (Subsequent) preventive examination.  Visit Complete: Virtual I connected with  Niels CHRISTELLA Sierras on 06/19/24 by a audio enabled telemedicine application and verified that I am speaking with the correct person using two identifiers.  Patient Location: Home  Provider Location: Office/Clinic  I discussed the limitations of evaluation and management by telemedicine. The patient expressed understanding and agreed to proceed.  Vital Signs: Because this visit was a virtual/telehealth visit, some criteria may be missing or patient reported. Any vitals not documented were not able to be obtained and vitals that have been documented are patient reported.  Patient Medicare AWV questionnaire was completed by the patient on 06/19/24; I have confirmed that all information answered by patient is correct and no changes since this date.  Cardiac Risk Factors include: advanced age (>71men, >44 women)     Objective:    Today's Vitals   06/19/24 1055  PainSc: 2    There is no height or weight on file to calculate BMI.     06/19/2024   11:02 AM 06/03/2024    4:41 PM 04/11/2023    3:02 PM 01/19/2021    3:59 PM 01/27/2020    4:19 PM 04/03/2018    1:14 PM  Advanced Directives  Does Patient Have a Medical Advance Directive? Yes Yes Yes Yes Yes Yes   Type of Advance Directive Living will;Healthcare Power of Attorney Living will;Healthcare Power of [employer] Power of [place];Living will Healthcare Power of [place];Living will Living will  Does patient want to make changes to medical advance directive? No - Patient declined  No - Patient declined No - Patient declined    Copy of Healthcare Power of Attorney in Chart?    No - copy requested       Data saved with a previous flowsheet row definition    Current Medications (verified) Outpatient Encounter Medications as of 06/19/2024  Medication Sig   aspirin EC  81 MG tablet Take 81 mg by mouth daily. Swallow whole.   Biotin 10 MG CAPS Take 10 mg by mouth daily.   calcium -vitamin D  (OSCAL WITH D) 500-200 MG-UNIT TABS tablet Take by mouth.   clotrimazole-betamethasone (LOTRISONE) cream Apply 1 Application topically 2 (two) times daily as needed.   cyanocobalamin  1000 MCG tablet Take 1,000 mcg by mouth daily.   dicyclomine  (BENTYL ) 10 MG capsule Take 1 capsule (10 mg total) by mouth 3 (three) times daily before meals. As needed (Patient taking differently: Take 10 mg by mouth 3 (three) times daily as needed for spasms. As needed)   diltiazem  (CARDIZEM  CD) 120 MG 24 hr capsule Take 1 capsule (120 mg total) by mouth daily.   esomeprazole  (NEXIUM ) 40 MG capsule Take 1 capsule (40 mg total) by mouth daily at 12 noon.   ketoconazole  (NIZORAL ) 2 % cream Apply to lower abdominal rash (Patient taking differently: Apply 1 Application topically daily as needed for irritation. Apply to lower abdominal rash)   magnesium  hydroxide (MILK OF MAGNESIA) 400 MG/5ML suspension Take 15 mLs by mouth daily as needed for mild constipation.   metoCLOPramide  (REGLAN ) 5 MG tablet Take 1 tablet (5 mg total) by mouth every 8 (eight) hours as needed for nausea or vomiting.   Omega-3 Fatty Acids (FISH OIL PO) Take 1 tablet by mouth daily at 6 (six) AM.   ondansetron  (ZOFRAN ) 4 MG tablet Take 1 tablet (4 mg total) by mouth every 8 (  eight) hours as needed for nausea or vomiting.   pyridoxine (B-6) 100 MG tablet Take 100 mg by mouth daily.   Vitamin D , Ergocalciferol , (DRISDOL ) 1.25 MG (50000 UNIT) CAPS capsule Take 50,000 Units by mouth every 7 (seven) days.   busPIRone  (BUSPAR ) 7.5 MG tablet TAKE 1 TABLET BY MOUTH 2 TIMES DAILY. (Patient not taking: Reported on 06/19/2024)   No facility-administered encounter medications on file as of 06/19/2024.    Allergies (verified) Amoxicillin, Clavulanic acid, Pantoprazole , Statins, Amoxicillin-pot clavulanate, Gadolinium derivatives, Iodinated  contrast media, and Iohexol    History: Past Medical History:  Diagnosis Date   Allergy    Anemia    Aneurysm of ascending aorta without rupture    Anxiety    Aortic atherosclerosis    Arthritis    Cataract    Depression    Family history of colon cancer    Fibromyalgia    GERD (gastroesophageal reflux disease)    Hyperlipidemia    IBS (irritable bowel syndrome)    Moderate recurrent major depression (HCC) 08/05/2021   OSA (obstructive sleep apnea)    Rectal prolapse    Stomach ulcer    Tick bite    took antibiotics memorial day weekend 2021 on back   Past Surgical History:  Procedure Laterality Date   APPENDECTOMY     CESAREAN SECTION     CHOLECYSTECTOMY     COLONOSCOPY  04/18/2014   Diverticulosis.    ESOPHAGOGASTRODUODENOSCOPY  04/14/2016   Schatzkis ring. Hiatal hernia. Gastritis.    HERNIA REPAIR  11/07/2021   Hiatal   LIPOMA EXCISION     NISSEN FUNDOPLICATION  11/07/2021   [facility]   thermal ablation     TONSILLECTOMY     Family History  Problem Relation Age of Onset   Colon cancer Mother    Colon cancer Father    Liver cancer Father    CAD Father    Hypertension Sister    Hyperlipidemia Brother    Gout Brother    Cancer Maternal Aunt        female cancer, not breast   Cancer Paternal Aunt        female cancer, not breast   Diabetes Maternal Grandmother    Diabetes Paternal Grandmother    Heart attack Other    Cancer Other        Leiomyosarcome and Liver   Migraines Other    Colon polyps Neg Hx    Esophageal cancer Neg Hx    Pancreatic cancer Neg Hx    Rectal cancer Neg Hx    Stomach cancer Neg Hx    Social History   Socioeconomic History   Marital status: Widowed    Spouse name: Not on file   Number of children: 2   Years of education: Not on file   Highest education level: 12th grade  Occupational History   Not on file  Tobacco Use   Smoking status: Never   Smokeless tobacco: Never  Vaping  Use   Vaping status: Never Used  Substance and Sexual Activity   Alcohol use: Never   Drug use: Never   Sexual activity: Not Currently  Other Topics Concern   Not on file  Social History Narrative   Not on file   Social Drivers of Health   Financial Resource Strain: Low Risk  (06/19/2024)   Overall Financial Resource Strain (CARDIA)    Difficulty of Paying Living Expenses: Not hard at all  Food Insecurity:  No Food Insecurity (06/19/2024)   Hunger Vital Sign    Worried About Running Out of Food in the Last Year: Never true    Ran Out of Food in the Last Year: Never true  Transportation Needs: No Transportation Needs (06/19/2024)   PRAPARE - Administrator, Civil Service (Medical): No    Lack of Transportation (Non-Medical): No  Physical Activity: Inactive (06/19/2024)   Exercise Vital Sign    Days of Exercise per Week: 0 days    Minutes of Exercise per Session: 0 min  Stress: No Stress Concern Present (06/19/2024)   [employer] of Occupational Health - Occupational Stress Questionnaire    Feeling of Stress: Not at all  Social Connections: Moderately Integrated (06/19/2024)   Social Connection and Isolation Panel    Frequency of Communication with Friends and Family: Three times a week    Frequency of Social Gatherings with Friends and Family: Three times a week    Attends Religious Services: More than 4 times per year    Active Member of Clubs or Organizations: Yes    Attends Banker Meetings: More than 4 times per year    Marital Status: Widowed    Tobacco Counseling Counseling given: Not Answered   Clinical Intake:     Pain : No/denies pain Pain Score: 2  Pain Type: Chronic pain Pain Location: Abdomen Pain Orientation: Right Pain Descriptors / Indicators: Cramping     BMI - recorded: 33.75 Nutritional Status: BMI 25 -29 Overweight Nutritional Risks: None  How often do you need to have someone help you when you read instructions,  pamphlets, or other written materials from your doctor or pharmacy?: 1 - Never  Interpreter Needed?: No      Activities of Daily Living    06/19/2024   11:00 AM  In your present state of health, do you have any difficulty performing the following activities:  Hearing? 0  Vision? 1  Comment Patient is having pressure in her eyes, but has appointment with eye doctor  Difficulty concentrating or making decisions? 0  Walking or climbing stairs? 1  Comment having some back pain  Dressing or bathing? 0  Doing errands, shopping? 0  Preparing Food and eating ? N  Using the Toilet? N  In the past six months, have you accidently leaked urine? Y  Do you have problems with loss of bowel control? N  Managing your Medications? N  Managing your Finances? N  Housekeeping or managing your Housekeeping? N    Patient Care Team: Sherre Clapper, MD as PCP - General (Family Medicine) Charlanne Groom, MD as Consulting Physician (Gastroenterology) Mat Browning, MD as Consulting Physician (Obstetrics and Gynecology) Raylene Debby MATSU., MD as Referring Physician (Cardiology)  Indicate any recent Medical Services you may have received from other than [facility] providers in the past year (date may be approximate).     Assessment:   This is a routine wellness examination for Luanne.  Hearing/Vision screen No results found.   Goals Addressed   None    Depression Screen    06/19/2024   10:58 AM 02/12/2024    8:42 AM 11/13/2023    8:13 AM 08/01/2023    1:32 PM 04/30/2023    1:29 PM 04/11/2023    3:49 PM 03/19/2023    8:51 AM  PHQ 2/9 Scores  PHQ - 2 Score 0 0 0 2 1 2 2   PHQ- 9 Score  2 3 5 7 7  8  Fall Risk    06/19/2024   10:59 AM 02/12/2024    8:42 AM 04/30/2023    1:29 PM 04/10/2023    5:26 PM 03/19/2023    8:51 AM  Fall Risk   Falls in the past year? 0 1 1 1  0  Number falls in past yr: 0 0 0 0 0  Injury with Fall? 0 0 1 1 0  Risk for fall due to : No Fall Risks;History of fall(s) No Fall Risks  No Fall Risks No Fall Risks No Fall Risks  Follow up Falls evaluation completed Falls evaluation completed Falls evaluation completed Falls evaluation completed;Education provided Falls evaluation completed    MEDICARE RISK AT HOME: Medicare Risk at Home Any stairs in or around the home?: Yes If so, are there any without handrails?: Yes Home free of loose throw rugs in walkways, pet beds, electrical cords, etc?: No Adequate lighting in your home to reduce risk of falls?: Yes Life alert?: No Use of a cane, walker or w/c?: No Grab bars in the bathroom?: Yes Shower chair or bench in shower?: Yes Elevated toilet seat or a handicapped toilet?: No  TIMED UP AND GO:  Was the test performed?  No    Cognitive Function:        04/11/2023    3:06 PM 01/19/2021    4:06 PM  6CIT Screen  What Year? 0 points 0 points  What month? 0 points 0 points  What time? 0 points 0 points  Count back from 20 0 points 0 points  Months in reverse 0 points 0 points  Repeat phrase 0 points 0 points  Total Score 0 points 0 points    Immunizations Immunization History  Administered Date(s) Administered   Fluad Quad(high Dose 65+) 08/05/2021, 09/27/2022   Fluad Trivalent(High Dose 65+) 08/01/2023   Pneumococcal Polysaccharide-23 10/11/2018    TDAP status: Due, Education has been provided regarding the importance of this vaccine. Advised may receive this vaccine at local pharmacy or Health Dept. Aware to provide a copy of the vaccination record if obtained from local pharmacy or Health Dept. Verbalized acceptance and understanding.  Flu Vaccine status: Due, Education has been provided regarding the importance of this vaccine. Advised may receive this vaccine at local pharmacy or Health Dept. Aware to provide a copy of the vaccination record if obtained from local pharmacy or Health Dept. Verbalized acceptance and understanding.  Pneumococcal vaccine status: Due, Education has been provided regarding the  importance of this vaccine. Advised may receive this vaccine at local pharmacy or Health Dept. Aware to provide a copy of the vaccination record if obtained from local pharmacy or Health Dept. Verbalized acceptance and understanding.  Covid-19 vaccine status: Declined, Education has been provided regarding the importance of this vaccine but patient still declined. Advised may receive this vaccine at local pharmacy or Health Dept.or vaccine clinic. Aware to provide a copy of the vaccination record if obtained from local pharmacy or Health Dept. Verbalized acceptance and understanding.  Qualifies for Shingles Vaccine? Yes   Zostavax completed Yes   Shingrix Completed?: No.    Education has been provided regarding the importance of this vaccine. Patient has been advised to call insurance company to determine out of pocket expense if they have not yet received this vaccine. Advised may also receive vaccine at local pharmacy or Health Dept. Verbalized acceptance and understanding.  Screening Tests Health Maintenance  Topic Date Due   DTaP/Tdap/Td (1 - Tdap) Never done   Zoster  Vaccines- Shingrix (1 of 2) Never done   Pneumococcal Vaccine: 50+ Years (2 of 2 - PCV) 10/12/2019   Influenza Vaccine  07/24/2024 (Originally 04/25/2024)   Medicare Annual Wellness (AWV)  06/19/2025   Colonoscopy  09/06/2028   DEXA SCAN  Completed   Hepatitis C Screening  Completed   HPV VACCINES  Aged Out   Meningococcal B Vaccine  Aged Out   Mammogram  Discontinued   COVID-19 Vaccine  Discontinued    Health Maintenance  Health Maintenance Due  Topic Date Due   DTaP/Tdap/Td (1 - Tdap) Never done   Zoster Vaccines- Shingrix (1 of 2) Never done   Pneumococcal Vaccine: 50+ Years (2 of 2 - PCV) 10/12/2019    Colorectal cancer screening: No longer required.   Mammogram status: Ordered 06/19/24. Pt provided with contact info and advised to call to schedule appt.   Completed Dexa Scan om 04/14/24  Lung Cancer  Screening: (Low Dose CT Chest recommended if Age 27-80 years, 20 pack-year currently smoking OR have quit w/in 15years.) does not qualify.   Lung Cancer Screening Referral: N/A  Additional Screening:  Hepatitis C Screening: does qualify; Completed 08/01/23  Vision Screening: Recommended annual ophthalmology exams for early detection of glaucoma and other disorders of the eye. Is the patient up to date with their annual eye exam?  No  Who is the provider or what is the name of the office in which the patient attends annual eye exams? [facility] If pt is not established with a provider, would they like to be referred to a provider to establish care? No .   Dental Screening: Recommended annual dental exams for proper oral hygiene    Community Resource Referral / Chronic Care Management: CRR required this visit?  No   CCM required this visit?  No     Plan:     I have personally reviewed and noted the following in the patient's chart:   Medical and social history Use of alcohol, tobacco or illicit drugs  Current medications and supplements including opioid prescriptions. Patient is not currently taking opioid prescriptions. Functional ability and status Nutritional status Physical activity Advanced directives List of other physicians Hospitalizations, surgeries, and ER visits in previous 12 months Vitals Screenings to include cognitive, depression, and falls Referrals and appointments  In addition, I have reviewed and discussed with patient certain preventive protocols, quality metrics, and best practice recommendations. A written personalized care plan for preventive services as well as general preventive health recommendations were provided to patient.     Laney LITTIE Ferries, CMA   06/19/2024   After Visit Summary: (MyChart) Due to this being a telephonic visit, the after visit summary with patients personalized plan was offered to patient via MyChart   Nurse  Notes: Patient stated that she went to hospital on 06/03/24 for right side pain, stated she is seeing her PCP for this and has been referral to GI, but in the hospital her CBC showed that her Hemoglobin was high and wanted to recheck it. Patient has appointment with GI next week. Patient also wanted her Vitamin d  check because she has been on 50,000 units of vitamin d  but has  not had it check in a year.  Provider made aware of patient concerns and recommend patient come in office for labs, b12 and folate, cbc, and vitamin d , and stop b12 and b6 until her gets labs back. Also ordered mammogram that patient is due for and refilled patient vitamin  d.

## 2024-06-20 ENCOUNTER — Other Ambulatory Visit

## 2024-06-20 ENCOUNTER — Ambulatory Visit (INDEPENDENT_AMBULATORY_CARE_PROVIDER_SITE_OTHER)

## 2024-06-20 DIAGNOSIS — Z23 Encounter for immunization: Secondary | ICD-10-CM | POA: Diagnosis not present

## 2024-06-20 DIAGNOSIS — R899 Unspecified abnormal finding in specimens from other organs, systems and tissues: Secondary | ICD-10-CM | POA: Diagnosis not present

## 2024-06-20 DIAGNOSIS — E559 Vitamin D deficiency, unspecified: Secondary | ICD-10-CM

## 2024-06-21 LAB — CBC WITH DIFFERENTIAL/PLATELET
Basophils Absolute: 0.1 x10E3/uL (ref 0.0–0.2)
Basos: 1 %
EOS (ABSOLUTE): 0.1 x10E3/uL (ref 0.0–0.4)
Eos: 2 %
Hematocrit: 45.1 % (ref 34.0–46.6)
Hemoglobin: 14.9 g/dL (ref 11.1–15.9)
Immature Grans (Abs): 0 x10E3/uL (ref 0.0–0.1)
Immature Granulocytes: 0 %
Lymphocytes Absolute: 1.3 x10E3/uL (ref 0.7–3.1)
Lymphs: 26 %
MCH: 30.8 pg (ref 26.6–33.0)
MCHC: 33 g/dL (ref 31.5–35.7)
MCV: 93 fL (ref 79–97)
Monocytes Absolute: 0.3 x10E3/uL (ref 0.1–0.9)
Monocytes: 7 %
Neutrophils Absolute: 3.2 x10E3/uL (ref 1.4–7.0)
Neutrophils: 64 %
Platelets: 245 x10E3/uL (ref 150–450)
RBC: 4.84 x10E6/uL (ref 3.77–5.28)
RDW: 12.5 % (ref 11.7–15.4)
WBC: 5 x10E3/uL (ref 3.4–10.8)

## 2024-06-21 LAB — B12 AND FOLATE PANEL
Folate: 8.3 ng/mL (ref >3.0)
Vitamin B-12: 393 pg/mL (ref 232–1245)

## 2024-06-21 LAB — VITAMIN D 25 HYDROXY (VIT D DEFICIENCY, FRACTURES): Vit D, 25-Hydroxy: 22.1 ng/mL — ABNORMAL LOW (ref 30.0–100.0)

## 2024-06-23 NOTE — Progress Notes (Unsigned)
 06/23/2024 Patricia Leonard [id number] [birth year]  Referring provider: Sherre Clapper, MD Primary GI doctor: Dr. Charlanne  ASSESSMENT AND PLAN:  Right flank pain x months More with movement, appears more MSK Try salon pas patches and follow up with PCP  Abnormal CT with esophageal thickening, worsening symptoms in setting of sparse NSAIDS and doxycycline  Gastroparesis secondary to nissan fundoplication 2023 gastric emptying study showed delayed emptying CT AB and pelvis 03/25 showed intact hiatal hernia repair and was normal 09/07/2023 EGD LA grade a reflux esophagitis, 1 cm HH, gastritis status post intact Nissan fundoplication 06/03/2024 CTA negative for PE but did show wall thickening of the distal esophagus Has been having right flank pain, reflux of acid into her throat, increase eructations Recent ABX for sinus infection, was on doxycycline  for 5 days but stopped due to worsening UGI symptoms She is on nexium  40 mg once a day ( switched from pantoprazole  40 mg once a day on 08/28) - increase nexium  to BID for 2 months, then go back to once a day - Consider SIBO testing or empiric treatment -Consider Motegrity  samples - consider repeat EGD if worsening symptoms or no improvement  Acute pulmonary embolism April 2023 After Nissan fundoplication  History of colonic polyps with Family history of colon cancer 09/07/2023 colonoscopy Dr. Charlanne TA 2 polyps 3 to 4 mm, moderate left colonic diverticulosis nonbleeding internal hemorrhoids No recall secondary to age  Irritable bowel syndrome with constipation Has a lot of gas, has BM with everytime she goes to the bathroom.  SIBO empiric treatment, has been a month since last ABX Consider Motegrity  samples Consider referral to pelvic floor physical therapy   History of Present Illness:  73 y.o. female  with a past medical history of hyperlipidemia, GERD with history of peptic ulcer disease, diverticulosis, internal hemorrhoids, status  post cholecystectomy and others listed below, returns to clinic today for follow-up   Patient last seen in the office 08/16/2022 by myself for reflux and gastroparesis.  Discussed the use of AI scribe software for clinical note transcription with the patient, who gave verbal consent to proceed.  History of Present Illness   [name] is a 73 year old female with gastroparesis and hiatal hernia who presents with right flank pain and gastrointestinal symptoms.  She experiences right flank pain that worsens with sneezing, deep breathing, and stretching. The pain is reminiscent of her previous pulmonary embolism after hiatal hernia repair. It has persisted for several months, and she is uncertain if it is related to her gastroparesis. She uses Tylenol  and occasionally low-dose ibuprofen for back pain, but the flank pain predates this medication use.  She has a history of gastroparesis and reports increased burping and acid reflux, particularly when bending over. A recent endoscopy in December 2024 showed mild esophagitis, a small hiatal hernia, and stomach inflammation. She was previously on Protonix  but switched to Nexium  once daily on August 28th. She is unsure about the timing of her Nexium  dose and has been inconsistent with taking it.  A few weeks ago, she experienced a sinus infection and was prescribed doxycycline , which she took for five days before discontinuing due to concerns about exacerbating her gastroparesis symptoms. She reports increased burping and discomfort after starting the medication.  She has a history of sciatica and experiences constipation with occasional diarrhea. She reports incomplete bowel movements and uses Salonpas patches for pain relief. She was evaluated for rectocele by a surgeon, who informed her that she does not  have it.    Past GI procedures: -UGI 02/2020: Large hiatal hernia, with approximately 60% of the stomach positioned in the chest. No acute  complication. -EGD 03/2018: 3 cm HH, mild gastritis.  Negative biopsies for HP.  04/14/2016-3 cm HH.  Neg SB Bx for celiac.  EGD 11/30/2014: Gastric ulcers, hiatal hernia, neg Bx -Colonoscopy 03/2018: Normal cecum.  6 mm polyp s/p polypectomy (TA), moderate left colonic diverticulosis, internal hemorrhoids, negative random colonic biopsies for microscopic colitis.  Repeat in 5 years.  04/20/2014; mild diverticulosis, previous hemorrhoidectomy.  Otherwise normal colonoscopy -CT AP without IV contrast 01/29/2020: Large HH, soft tissue fullness in the cecum, pelvic floor laxity, constipation.  03/26/2014: Small adrenal adenoma, otherwise normal. 12/15/2021- CT AB and pelvis without contrast for flank pain showed normal pancreatitis, spleen, normal stomach, hiatal hernia interval repair.   12/29/2021 pneumonia with bilateral PE  07/12/2022 gastric emptying study showed delayed gastric emptying with 36% emptied at 4 hours.  Current Medications:    Current Outpatient Medications (Cardiovascular):    diltiazem  (CARDIZEM  CD) 120 MG 24 hr capsule, Take 1 capsule (120 mg total) by mouth daily.   Current Outpatient Medications (Analgesics):    aspirin EC 81 MG tablet, Take 81 mg by mouth daily. Swallow whole.  Current Outpatient Medications (Hematological):    cyanocobalamin  1000 MCG tablet, Take 1,000 mcg by mouth daily.  Current Outpatient Medications (Other):    Biotin 10 MG CAPS, Take 10 mg by mouth daily.   busPIRone  (BUSPAR ) 7.5 MG tablet, TAKE 1 TABLET BY MOUTH 2 TIMES DAILY. (Patient not taking: Reported on 06/19/2024)   calcium -vitamin D  (OSCAL WITH D) 500-200 MG-UNIT TABS tablet, Take by mouth.   clotrimazole-betamethasone (LOTRISONE) cream, Apply 1 Application topically 2 (two) times daily as needed.   dicyclomine  (BENTYL ) 10 MG capsule, Take 1 capsule (10 mg total) by mouth 3 (three) times daily before meals. As needed (Patient taking differently: Take 10 mg by mouth 3 (three) times daily as needed  for spasms. As needed)   esomeprazole  (NEXIUM ) 40 MG capsule, Take 1 capsule (40 mg total) by mouth daily at 12 noon.   ketoconazole  (NIZORAL ) 2 % cream, Apply to lower abdominal rash (Patient taking differently: Apply 1 Application topically daily as needed for irritation. Apply to lower abdominal rash)   magnesium  hydroxide (MILK OF MAGNESIA) 400 MG/5ML suspension, Take 15 mLs by mouth daily as needed for mild constipation.   metoCLOPramide  (REGLAN ) 5 MG tablet, Take 1 tablet (5 mg total) by mouth every 8 (eight) hours as needed for nausea or vomiting.   Omega-3 Fatty Acids (FISH OIL PO), Take 1 tablet by mouth daily at 6 (six) AM.   ondansetron  (ZOFRAN ) 4 MG tablet, Take 1 tablet (4 mg total) by mouth every 8 (eight) hours as needed for nausea or vomiting.   pyridoxine (B-6) 100 MG tablet, Take 100 mg by mouth daily.   Vitamin D , Ergocalciferol , (DRISDOL ) 1.25 MG (50000 UNIT) CAPS capsule, Take 1 capsule (50,000 Units total) by mouth every 7 (seven) days.  Surgical History:  She  has a past surgical history that includes Tonsillectomy; Appendectomy; Cholecystectomy; Cesarean section; Colonoscopy (04/18/2014); Esophagogastroduodenoscopy (04/14/2016); Lipoma excision; thermal ablation; Hernia repair (11/07/2021); and Nissen fundoplication (11/07/2021). Family History:  Her family history includes CAD in her father; Cancer in her maternal aunt, paternal aunt, and another family member; Colon cancer in her father and mother; Diabetes in her maternal grandmother and paternal grandmother; Gout in her brother; Heart attack in an other family member; Hyperlipidemia  in her brother; Hypertension in her sister; Liver cancer in her father; Migraines in an other family member. Social History:   reports that she has never smoked. She has never used smokeless tobacco. She reports that she does not drink alcohol and does not use drugs.  Current Medications, Allergies, Past Medical History, Past Surgical History,  Family History and Social History were reviewed in [employer] record.  Physical Exam: There were no vitals taken for this visit. General:   Pleasant, well developed female in no acute distress Heart:  Regular rate and rhythm; no murmurs Pulm: Clear anteriorly; decreased breath sounds right lower, some mild costochondral tenderness left anterior, no rash Abdomen:  Soft, Obese AB, Active bowel sounds. mild tenderness in the RUQ, epigastric. Without guarding and Without rebound, No organomegaly appreciated. Extremities:  without  edema. Neurologic:  Alert and  oriented x4;  No focal deficits.  Psych:  Cooperative. Normal mood and affect.   Alan JONELLE Coombs, PA-C 06/23/24

## 2024-06-24 ENCOUNTER — Ambulatory Visit (INDEPENDENT_AMBULATORY_CARE_PROVIDER_SITE_OTHER): Admitting: Physician Assistant

## 2024-06-24 ENCOUNTER — Ambulatory Visit: Payer: Self-pay | Admitting: Physician Assistant

## 2024-06-24 ENCOUNTER — Encounter: Payer: Self-pay | Admitting: Physician Assistant

## 2024-06-24 VITALS — BP 124/78 | HR 64 | Ht 66.0 in | Wt 210.0 lb

## 2024-06-24 DIAGNOSIS — R109 Unspecified abdominal pain: Secondary | ICD-10-CM

## 2024-06-24 DIAGNOSIS — Z860100 Personal history of colon polyps, unspecified: Secondary | ICD-10-CM | POA: Diagnosis not present

## 2024-06-24 DIAGNOSIS — Z800 Family history of malignant neoplasm of digestive organs: Secondary | ICD-10-CM

## 2024-06-24 DIAGNOSIS — K2100 Gastro-esophageal reflux disease with esophagitis, without bleeding: Secondary | ICD-10-CM | POA: Diagnosis not present

## 2024-06-24 DIAGNOSIS — Z86711 Personal history of pulmonary embolism: Secondary | ICD-10-CM

## 2024-06-24 DIAGNOSIS — Z9889 Other specified postprocedural states: Secondary | ICD-10-CM

## 2024-06-24 DIAGNOSIS — K449 Diaphragmatic hernia without obstruction or gangrene: Secondary | ICD-10-CM | POA: Diagnosis not present

## 2024-06-24 DIAGNOSIS — R110 Nausea: Secondary | ICD-10-CM

## 2024-06-24 DIAGNOSIS — R140 Abdominal distension (gaseous): Secondary | ICD-10-CM

## 2024-06-24 DIAGNOSIS — R142 Eructation: Secondary | ICD-10-CM

## 2024-06-24 DIAGNOSIS — K581 Irritable bowel syndrome with constipation: Secondary | ICD-10-CM | POA: Diagnosis not present

## 2024-06-24 DIAGNOSIS — K3184 Gastroparesis: Secondary | ICD-10-CM | POA: Diagnosis not present

## 2024-06-24 MED ORDER — METRONIDAZOLE 250 MG PO TABS: 250.0000 mg | ORAL_TABLET | Freq: Three times a day (TID) | ORAL | 0 refills | Status: AC

## 2024-06-24 MED ORDER — ESOMEPRAZOLE MAGNESIUM 40 MG PO CPDR: 40.0000 mg | DELAYED_RELEASE_CAPSULE | Freq: Two times a day (BID) | ORAL | 0 refills | Status: DC

## 2024-06-24 NOTE — Patient Instructions (Addendum)
 _______________________________________________________  If your blood pressure at your visit was 140/90 or greater, please contact your primary care physician to follow up on this.  _______________________________________________________  If you are age 75 or older, your body mass index should be between 23-30. Your Body mass index is 33.89 kg/m. If this is out of the aforementioned range listed, please consider follow up with your Primary Care Provider.  If you are age 59 or younger, your body mass index should be between 19-25. Your Body mass index is 33.89 kg/m. If this is out of the aformentioned range listed, please consider follow up with your Primary Care Provider.   ________________________________________________________  The [facility] providers would like to encourage you to use MYCHART to communicate with providers for non-urgent requests or questions.  Due to long hold times on the telephone, sending your provider a message by [facility] may be a faster and more efficient way to get a response.  Please allow 48 business hours for a response.  Please remember that this is for non-urgent requests.  _______________________________________________________  [facility] is using a team-based approach to care.  Your team is made up of your doctor and two to three APPS. Our APPS (Nurse Practitioners and Physician Assistants) work with your physician to ensure care continuity for you. They are fully qualified to address your health concerns and develop a treatment plan. They communicate directly with your gastroenterologist to care for you. Seeing the Advanced Practice Practitioners on your physician's team can help you by facilitating care more promptly, often allowing for earlier appointments, access to diagnostic testing, procedures, and other specialty referrals.   No aleve, ibuprofen, goody powders, as these are antiinflammatories and can cause inflammation in your stomach,  increase bleeding risk and cause ulcers.  Can do tyelnol max 3000 mg a day, salon pas patches are over the counter and voltern gel is topical antiinflammatory that is safe.   Please take your proton pump inhibitor medication, nexium  twice a day for 2 months, then back to once a day, Please take this medication 30 minutes to 1 hour before meals- this makes it more effective.  Avoid spicy and acidic foods Avoid fatty foods Limit your intake of coffee, tea, alcohol, and carbonated drinks Work to maintain a healthy weight Keep the head of the bed elevated at least 3 inches with blocks or a wedge pillow if you are having any nighttime symptoms Stay upright for 2 hours after eating Avoid meals and snacks three to four hours before bedtime  Try the flagyl for the SIBO ( see below) 10 days, stop if worsening nausea.   Toileting tips to help with your constipation - Drink at least 64-80 ounces of water/liquid per day. - Establish a time to try to move your bowels every day.  For many people, this is after a cup of coffee or after a meal such as breakfast. - Sit all of the way back on the toilet keeping your back fairly straight and while sitting up, try to rest the tops of your forearms on your upper thighs.   - Raising your feet with a step stool/squatty potty can be helpful to improve the angle that allows your stool to pass through the rectum. - Relax the rectum feeling it bulge toward the toilet water.  If you feel your rectum raising toward your body, you are contracting rather than relaxing. - Breathe in and slowly exhale. Belly breath by expanding your belly towards your belly button. Keep belly expanded  as you gently direct pressure down and back to the anus.  A low pitched GRRR sound can assist with increasing intra-abdominal pressure.  (Can also trying to blow on a pinwheel and make it move, this helps with the same belly breathing) - Repeat 3-4 times. If unsuccessful, contract the pelvic  floor to restore normal tone and get off the toilet.  Avoid excessive straining. - To reduce excessive wiping by teaching your anus to normally contract, place hands on outer aspect of knees and resist knee movement outward.  Hold 5-10 second then place hands just inside of knees and resist inward movement of knees.  Hold 5 seconds.  Repeat a few times each way.  Go to the ER if unable to pass gas, severe AB pain, unable to hold down food, any shortness of breath of chest pain.  Reglan  or metoclopramide   Can be used for gastroparesis or slow stomach, nausea, vomiting, GERD.   Continue the medication as needed up to 2 times a day, on an empty stomach 30 minutes before eating. It may take a few weeks for your stomach condition to start to get better. However, do not take this medicine for longer than 12 weeks.  The longer you take this medicine, and the more you take it, the greater your chances are of developing serious side effects.  Some people may get a severe muscle problem called tardive dyskinesia. This problem may lessen or go away after stopping this drug, but it may not go away. The risk is greater with diabetes and in older adults, especially older females. The risk is greater with longer use or higher doses, but it may also occur after short-term use with low doses. Call your doctor right away if you have trouble controlling body movements or problems with your tongue, face, mouth, or jaw like tongue sticking out, puffing cheeks, mouth puckering, or chewing.   Please monitor for worsening depression or thoughts of suicide, any aggressiveness or hyperactivity.  If this happen please stop and call your physician right away.    Small intestinal bacterial overgrowth (SIBO) occurs when there is an abnormal increase in the overall bacterial population in the small intestine -- particularly types of bacteria not commonly found in that part of the digestive tract. Small intestinal bacterial  overgrowth (SIBO) commonly results when a circumstance -- such as surgery or disease -- slows the passage of food and waste products in the digestive tract, creating a breeding ground for bacteria.  Signs and symptoms of SIBO often include: Loss of appetite Abdominal pain Nausea Bloating An uncomfortable feeling of fullness after eating Diarrhea or constipation, depending on the type of gas produced  What foods trigger SIBO? While foods aren't the original cause of SIBO, certain foods do encourage the overgrowth of the wrong bacteria in your small intestine. If you're feeding them their favorite foods, they're going to grow more, and that will trigger more of your SIBO symptoms. By the same token, you can help reduce the overgrowth by starving the problematic bacteria of their favorite foods. This strategy has led to a number of proposed SIBO eating plans. The plans vary, and so do individual results. But in general, they tend to recommend limiting carbohydrates.  These include: Sugars and sweeteners. Fruits and starchy vegetables. Dairy products. Grains.  There is a test for this we can do called a breath test, if you are positive we will treat you with an antibiotic to see if it helps.  Your symptoms  are very suspicious for this condition, as discussed, we will start you on an antibiotic to see if this helps.   Here some information about pelvic floor dysfunction. This may be contributing to some of your symptoms. We will continue with our evaluation but I do want you to consider adding on fiber supplement with low-dose MiraLAX daily. We could also refer to pelvic floor physical therapy.   Pelvic Floor Dysfunction, Female Pelvic floor dysfunction (PFD) is a condition that results when the group of muscles and connective tissues that support the organs in the pelvis (pelvic floor muscles) do not work well. These muscles and their connections form a sling that supports the colon and  bladder. In women, they also support the uterus. PFD causes pelvic floor muscles to be too weak, too tight, or both. In PFD, muscle movements are not coordinated. This may cause bowel or bladder problems. It may also cause pain. What are the causes? This condition may be caused by an injury to the pelvic area or by a weakening of pelvic muscles. This often results from pregnancy and childbirth or other types of strain. In many cases, the exact cause is not known. What increases the risk? The following factors may make you more likely to develop this condition: Having chronic bladder tissue inflammation (interstitial cystitis). Being an older person. Being overweight. History of radiation treatment for cancer in the pelvic region. Previous pelvic surgery, such as removal of the uterus (hysterectomy). What are the signs or symptoms? Symptoms of this condition vary and may include: Bladder symptoms, such as: Trouble starting urination and emptying the bladder. Frequent urinary tract infections. Leaking urine when coughing, laughing, or exercising (stress incontinence). Having to pass urine urgently or frequently. Pain when passing urine. Bowel symptoms, such as: Constipation. Urgent or frequent bowel movements. Incomplete bowel movements. Painful bowel movements. Leaking stool or gas. Unexplained genital or rectal pain. Genital or rectal muscle spasms. Low back pain. Other symptoms may include: A heavy, full, or aching feeling in the vagina. A bulge that protrudes into the vagina. Pain during or after sex. How is this diagnosed? This condition may be diagnosed based on: Your symptoms and medical history. A physical exam. During the exam, your health care provider may check your pelvic muscles for tightness, spasm, pain, or weakness. This may include a rectal exam and a pelvic exam. In some cases, you may have diagnostic tests, such as: Electrical muscle function tests. Urine flow  testing. X-ray tests of bowel function. Ultrasound of the pelvic organs. How is this treated? Treatment for this condition depends on the symptoms. Treatment options include: Physical therapy. This may include Kegel exercises to help relax or strengthen the pelvic floor muscles. Biofeedback. This type of therapy provides feedback on how tight your pelvic floor muscles are so that you can learn to control them. Internal or external massage therapy. A treatment that involves electrical stimulation of the pelvic floor muscles to help control pain (transcutaneous electrical nerve stimulation, or TENS). Sound wave therapy (ultrasound) to reduce muscle spasms. Medicines, such as: Muscle relaxants. Bladder control medicines. Surgery to reconstruct or support pelvic floor muscles may be an option if other treatments do not help. Follow these instructions at home: Activity Do your usual activities as told by your health care provider. Ask your health care provider if you should modify any activities. Do pelvic floor strengthening or relaxing exercises at home as told by your physical therapist. Lifestyle Maintain a healthy weight. Eat foods that are  high in fiber, such as beans, whole grains, and fresh fruits and vegetables. Limit foods that are high in fat and processed sugars, such as fried or sweet foods. Manage stress with relaxation techniques such as yoga or meditation. General instructions If you have problems with leakage: Use absorbable pads or wear padded underwear. Wash frequently with mild soap. Keep your genital and anal area as clean and dry as possible. Ask your health care provider if you should try a barrier cream to prevent skin irritation. Take warm baths to relieve pelvic muscle tension or spasms. Take over-the-counter and prescription medicines only as told by your health care provider. Keep all follow-up visits. How is this prevented? The cause of PFD is not always  known, but there are a few things you can do to reduce the risk of developing this condition, including: Staying at a healthy weight. Getting regular exercise. Managing stress. Contact a health care provider if: Your symptoms are not improving with home care. You have signs or symptoms of PFD that get worse at home. You develop new signs or symptoms. You have signs of a urinary tract infection, such as: Fever. Chills. Increased urinary frequency. A burning feeling when urinating. You have not had a bowel movement in 3 days (constipation). Summary Pelvic floor dysfunction results when the muscles and connective tissues in your pelvic floor do not work well. These muscles and their connections form a sling that supports your colon and bladder. In women, they also support the uterus. PFD may be caused by an injury to the pelvic area or by a weakening of pelvic muscles. PFD causes pelvic floor muscles to be too weak, too tight, or a combination of both. Symptoms may vary from person to person. In most cases, PFD can be treated with physical therapies and medicines. Surgery may be an option if other treatments do not help. This information is not intended to replace advice given to you by your health care provider. Make sure you discuss any questions you have with your health care provider. Document Revised: 01/19/2021 Document Reviewed: 01/19/2021 Elsevier Patient Education  2022 [employer]  Thank you for entrusting me with your care and choosing [facility].  Alan Coombs, PA-C

## 2024-06-25 ENCOUNTER — Ambulatory Visit (HOSPITAL_BASED_OUTPATIENT_CLINIC_OR_DEPARTMENT_OTHER): Admitting: Radiology

## 2024-06-25 DIAGNOSIS — I4719 Other supraventricular tachycardia: Secondary | ICD-10-CM | POA: Diagnosis not present

## 2024-07-02 DIAGNOSIS — H40053 Ocular hypertension, bilateral: Secondary | ICD-10-CM | POA: Diagnosis not present

## 2024-07-02 DIAGNOSIS — H2513 Age-related nuclear cataract, bilateral: Secondary | ICD-10-CM | POA: Diagnosis not present

## 2024-07-18 DIAGNOSIS — M47816 Spondylosis without myelopathy or radiculopathy, lumbar region: Secondary | ICD-10-CM | POA: Diagnosis not present

## 2024-07-18 DIAGNOSIS — Z6834 Body mass index (BMI) 34.0-34.9, adult: Secondary | ICD-10-CM | POA: Diagnosis not present

## 2024-07-28 DIAGNOSIS — I4719 Other supraventricular tachycardia: Secondary | ICD-10-CM | POA: Diagnosis not present

## 2024-08-02 ENCOUNTER — Ambulatory Visit (HOSPITAL_BASED_OUTPATIENT_CLINIC_OR_DEPARTMENT_OTHER)
Admission: EM | Admit: 2024-08-02 | Discharge: 2024-08-02 | Disposition: A | Discharge status: Home / Self Care | Attending: Family Medicine | Admitting: Family Medicine

## 2024-08-02 ENCOUNTER — Encounter (HOSPITAL_BASED_OUTPATIENT_CLINIC_OR_DEPARTMENT_OTHER): Payer: Self-pay

## 2024-08-02 DIAGNOSIS — J0110 Acute frontal sinusitis, unspecified: Secondary | ICD-10-CM

## 2024-08-02 MED ORDER — AZITHROMYCIN 250 MG PO TABS: 250.0000 mg | ORAL_TABLET | Freq: Every day | ORAL | 0 refills | Status: DC

## 2024-08-02 NOTE — Discharge Instructions (Signed)
 Treating you for a sinus infection.  Take the antibiotics as prescribed.  Recommend Zyrtec and Flonase daily.  Follow-up as needed

## 2024-08-02 NOTE — ED Triage Notes (Signed)
 Patient states cough, headache, sinus pressure off and on x 3-4 weeks. Having pain between shoulder blades, and shortness of breath for a good while now. Hx of gastroparesis. Was on doxycycline  for a sinus/ear infection about 3 weeks ago and had to stop it due to her gastroparesis. Thinks she might need another antibiotic. States headache and pressure around her eyes is the most uncomfortable. Having trouble getting mucous up out of her chest. No tylenol  taken for pain today.

## 2024-08-03 NOTE — ED Provider Notes (Signed)
 [facility]    CSN: [id number] Arrival date & time: 08/02/24  1120      History   Chief Complaint Chief Complaint  Patient presents with   Headache   Fatigue   Sinus pressure    HPI Patricia Leonard is a 73 y.o. female.   Patient states cough, headache, sinus pressure off and on x 3-4 weeks. Having pain between shoulder blades, and shortness of breath for a good while now. Hx of gastroparesis. Was on doxycycline  for a sinus/ear infection about 3 weeks ago and had to stop it due to her gastroparesis. Thinks she might need another antibiotic. States headache and pressure around her eyes is the most uncomfortable. Having trouble getting mucous up out of her chest. No tylenol  taken for pain today.    Headache   Past Medical History:  Diagnosis Date   Allergy    Anemia    Aneurysm of ascending aorta without rupture    Anxiety    Aortic atherosclerosis    Arthritis    Cataract    Depression    Family history of colon cancer    Fibromyalgia    GERD (gastroesophageal reflux disease)    Hyperlipidemia    IBS (irritable bowel syndrome)    Moderate recurrent major depression (HCC) 08/05/2021   OSA (obstructive sleep apnea)    Rectal prolapse    Stomach ulcer    Tick bite    took antibiotics memorial day weekend 2021 on back    Patient Active Problem List   Diagnosis Date Noted   Acute right-sided thoracic back pain 06/03/2024   GAD (generalized anxiety disorder) 05/24/2024   Hearing loss of left ear due to cerumen impaction 05/24/2024   Absence of bladder continence 05/24/2024   Finger pain, right 05/24/2024   Spinal stenosis of lumbar region 03/20/2024   Hypotension 03/20/2024   Drug intoxication with perceptual disturbance (HCC) 03/20/2024   Lumbar back pain 03/20/2024   Sciatica of right side 03/20/2024   SVT (supraventricular tachycardia) 02/12/2024   History of iron deficiency anemia 02/12/2024   Osteoarthritis of right knee 12/06/2023    Arthralgia of right knee 12/04/2023   Shortness of breath 11/28/2023   Sinus bradycardia 11/28/2023   Impacted cerumen, bilateral 11/17/2023   Sinusitis 11/13/2023   Chronic pain of both knees 11/13/2023   Angioma 10/31/2023   Lentigo 10/31/2023   Melanocytic nevus of trunk 10/31/2023   Seborrheic keratoses 10/31/2023   Hair loss 06/06/2023   Screening for colon cancer 05/03/2023   Primary insomnia 05/03/2023   Class 1 obesity due to excess calories without serious comorbidity with body mass index (BMI) of 32.0 to 32.9 in adult 03/20/2023   Localized macular rash 03/19/2023   Benign essential hypertension 01/28/2023   Pruritus 01/28/2023   Aneurysm of ascending aorta without rupture 01/02/2023   Thyroid  nodule 11/26/2022   Gastroparesis 09/27/2022   Vitamin D  deficiency 09/27/2022   History of pulmonary embolism 09/27/2022   Prediabetes 09/27/2022   Right-sided chest pain 09/27/2022   Myalgia due to statin 05/27/2022   Osteopenia 08/05/2021   Aortic valve sclerosis 01/03/2021   OSA (obstructive sleep apnea) 11/22/2020   Palpitations 11/22/2020   Cervical disc disorder with radiculopathy of cervical region 08/23/2020   Lumbar radiculopathy 08/23/2020   Rectocele 02/09/2020   Mixed hyperlipidemia 02/09/2020   Aortic atherosclerosis 02/09/2020   Irritable bowel syndrome with constipation 02/09/2020   Hiatal hernia 02/09/2020   Migraine 02/09/2020   Gastroesophageal reflux disease 01/20/2020  History of IBS 01/20/2020   Hx of adenomatous colonic polyps 01/20/2020   Nuclear cataract 09/13/2011   Hyperopia with astigmatism 09/13/2011    Past Surgical History:  Procedure Laterality Date   APPENDECTOMY     CESAREAN SECTION     CHOLECYSTECTOMY     COLONOSCOPY  04/18/2014   Diverticulosis.    ESOPHAGOGASTRODUODENOSCOPY  04/14/2016   Schatzkis ring. Hiatal hernia. Gastritis.    HERNIA REPAIR  11/07/2021   Hiatal   LIPOMA EXCISION     NISSEN FUNDOPLICATION  11/07/2021    [facility]   thermal ablation     TONSILLECTOMY      OB History   No obstetric history on file.      Home Medications    Prior to Admission medications   Medication Sig Start Date End Date Taking? Authorizing Provider  azithromycin  (ZITHROMAX ) 250 MG tablet Take 1 tablet (250 mg total) by mouth daily. Take first 2 tablets together, then 1 every day until finished. 08/02/24  Yes Kinsler Soeder A, FNP  aspirin EC 81 MG tablet Take 81 mg by mouth daily. Swallow whole.    [provider]  Biotin 10 MG CAPS Take 10 mg by mouth daily.    [provider]  busPIRone  (BUSPAR ) 7.5 MG tablet TAKE 1 TABLET BY MOUTH 2 TIMES DAILY. Patient not taking: Reported on 06/19/2024 06/16/24   CoxAbigail, MD  calcium -vitamin D  (OSCAL WITH D) 500-200 MG-UNIT TABS tablet Take by mouth.    [provider]  clotrimazole-betamethasone (LOTRISONE) cream Apply 1 Application topically 2 (two) times daily as needed. 07/28/20   [provider]  cyanocobalamin  1000 MCG tablet Take 1,000 mcg by mouth daily.    [provider]  dicyclomine  (BENTYL ) 10 MG capsule Take 1 capsule (10 mg total) by mouth 3 (three) times daily before meals. As needed Patient taking differently: Take 10 mg by mouth 3 (three) times daily as needed for spasms. As needed 01/19/20   Esterwood, Amy S, PA-C  diltiazem  (CARDIZEM  CD) 120 MG 24 hr capsule Take 1 capsule (120 mg total) by mouth daily. 05/22/24 05/22/25  Sherre Abigail, MD  esomeprazole  (NEXIUM ) 40 MG capsule Take 1 capsule (40 mg total) by mouth 2 (two) times daily before a meal. 06/24/24   Craig Alan SAUNDERS, PA-C  ketoconazole  (NIZORAL ) 2 % cream Apply to lower abdominal rash Patient taking differently: Apply 1 Application topically daily as needed for irritation. Apply to lower abdominal rash 06/27/21   Charlene Clotilda PARAS, NP  magnesium  hydroxide (MILK OF MAGNESIA) 400 MG/5ML suspension Take 15 mLs by mouth daily as needed  for mild constipation.    [provider]  metoCLOPramide  (REGLAN ) 5 MG tablet Take 1 tablet (5 mg total) by mouth every 8 (eight) hours as needed for nausea or vomiting. 10/30/22   Craig Alan SAUNDERS, PA-C  Omega-3 Fatty Acids (FISH OIL PO) Take 1 tablet by mouth daily at 6 (six) AM.    [provider]  ondansetron  (ZOFRAN ) 4 MG tablet Take 1 tablet (4 mg total) by mouth every 8 (eight) hours as needed for nausea or vomiting. 02/12/24   Sherre Abigail, MD  pyridoxine (B-6) 100 MG tablet Take 100 mg by mouth daily.    [provider]  Vitamin D , Ergocalciferol , (DRISDOL ) 1.25 MG (50000 UNIT) CAPS capsule Take 1 capsule (50,000 Units total) by mouth every 7 (seven) days. 06/19/24   Milon Cleaves, PA    Family History Family  History  Problem Relation Age of Onset   Colon cancer Mother    Colon cancer Father    Liver cancer Father    CAD Father    Hypertension Sister    Hyperlipidemia Brother    Gout Brother    Cancer Maternal Aunt        female cancer, not breast   Cancer Paternal Aunt        female cancer, not breast   Diabetes Maternal Grandmother    Diabetes Paternal Grandmother    Heart attack Other    Cancer Other        Leiomyosarcome and Liver   Migraines Other    Colon polyps Neg Hx    Esophageal cancer Neg Hx    Pancreatic cancer Neg Hx    Rectal cancer Neg Hx    Stomach cancer Neg Hx     Social History Social History   Tobacco Use   Smoking status: Never   Smokeless tobacco: Never  Vaping Use   Vaping status: Never Used  Substance Use Topics   Alcohol use: Never   Drug use: Never     Allergies   Amoxicillin, Clavulanic acid, Pantoprazole , Statins, Amoxicillin-pot clavulanate, Gadolinium derivatives, Iodinated contrast media, and Iohexol    Review of Systems Review of Systems  Neurological:  Positive for headaches.     Physical Exam Triage Vital Signs ED Triage Vitals  Encounter Vitals Group     BP 08/02/24 1138 117/75      Girls Systolic BP Percentile --      Girls Diastolic BP Percentile --      Boys Systolic BP Percentile --      Boys Diastolic BP Percentile --      Pulse Rate 08/02/24 1138 (!) 54     Resp 08/02/24 1138 20     Temp 08/02/24 1138 98.5 F (36.9 C)     Temp Source 08/02/24 1138 Oral     SpO2 08/02/24 1138 96 %     Weight --      Height --      Head Circumference --      Peak Flow --      Pain Score 08/02/24 1140 6     Pain Loc --      Pain Education --      Exclude from Growth Chart --    No data found.  Updated Vital Signs BP 117/75 (BP Location: Right Arm)   Pulse (!) 54   Temp 98.5 F (36.9 C) (Oral)   Resp 20   SpO2 96%   Visual Acuity Right Eye Distance:   Left Eye Distance:   Bilateral Distance:    Right Eye Near:   Left Eye Near:    Bilateral Near:     Physical Exam Vitals and nursing note reviewed.  Constitutional:      General: She is not in acute distress.    Appearance: Normal appearance. She is not ill-appearing, toxic-appearing or diaphoretic.  HENT:     Head: Normocephalic and atraumatic.     Right Ear: Tympanic membrane and ear canal normal.     Left Ear: Tympanic membrane and ear canal normal.     Nose: Congestion present.     Mouth/Throat:     Pharynx: Oropharynx is clear.  Eyes:     Conjunctiva/sclera: Conjunctivae normal.  Cardiovascular:     Rate and Rhythm: Normal rate and regular rhythm.     Pulses: Normal pulses.     Heart  sounds: Normal heart sounds.  Pulmonary:     Effort: Pulmonary effort is normal.     Breath sounds: Normal breath sounds.  Skin:    General: Skin is warm and dry.  Neurological:     Mental Status: She is alert.  Psychiatric:        Mood and Affect: Mood normal.      UC Treatments / Results  Labs (all labs ordered are listed, but only abnormal results are displayed) Labs Reviewed - No data to display  EKG   Radiology No results found.  Procedures Procedures (including critical care  time)  Medications Ordered in UC Medications - No data to display  Initial Impression / Assessment and Plan / UC Course  I have reviewed the triage vital signs and the nursing notes.  Pertinent labs & imaging results that were available during my care of the patient were reviewed by me and considered in my medical decision making (see chart for details).     Acute sinusitis-treating for sinus infection with Zithromax  based on amoxicillin allergy.  Recommend Zyrtec and Flonase daily.  Follow-up as needed Final Clinical Impressions(s) / UC Diagnoses   Final diagnoses:  Acute non-recurrent frontal sinusitis     Discharge Instructions      Treating you for a sinus infection.  Take the antibiotics as prescribed.  Recommend Zyrtec and Flonase daily.  Follow-up as needed   ED Prescriptions     Medication Sig Dispense Auth. Provider   azithromycin  (ZITHROMAX ) 250 MG tablet Take 1 tablet (250 mg total) by mouth daily. Take first 2 tablets together, then 1 every day until finished. 6 tablet Adah Wilbert LABOR, FNP      PDMP not reviewed this encounter.   Adah Wilbert LABOR, FNP 08/03/24 [phone]

## 2024-08-19 ENCOUNTER — Ambulatory Visit: Admitting: Physician Assistant

## 2024-09-03 NOTE — Progress Notes (Unsigned)
 Subjective:  Patient ID: Patricia Leonard, female    DOB: May 06, 1951  Age: 73 y.o. MRN: [id number]  Chief Complaint  Patient presents with   Medical Management of Chronic Issues    HPI: Discussed the use of AI scribe software for clinical note transcription with the patient, who gave verbal consent to proceed.  History of Present Illness Patricia Leonard is a 73 year old female with chronic back pain and sciatica who presents with persistent leg pain and a request for surgical evaluation.  Radicular leg pain and sciatica - Persistent pain located in the center of the hip radiating down the leg, described as constant and tender. - Previous injections provided partial relief, but pain persists. - Gabapentin  trial provided some relief but caused burning sensation in the toes. - Prednisone  course in the past did not significantly improve symptoms. - Neurosurgical evaluation indicated multiple pinched nerves; surgical intervention not pursued due to uncertainty regarding the affected nerve. - Decreased physical activity due to pain.  Back pain - Chronic back pain associated with sciatica. - Pain contributes to decreased physical activity and fatigue.  Palpitations and tachycardia - History of rapid heartbeat and palpitations, occurring infrequently. - Currently taking diltiazem  for rate control. - Previous heart monitor evaluation; flecainide considered but not initiated due to hesitancy.  Hyperlipidemia and statin intolerance - Recent increase in LDL cholesterol from 122 to 186; decrease in HDL cholesterol. - Inconsistent use of cholesterol medication due to muscle pain associated with statins.  Respiratory symptoms and submandibular tenderness - Tenderness under the right side of the chin for several weeks, with associated post nasal drainage. - Recent respiratory illness suspected as possible etiology. - No fevers, chills, sweats, earaches, or sore throat. - Shortness of breath  with exertion and fatigue, partially attributed to decreased activity.  Genitourinary symptoms - Pruritus in the vaginal area for approximately one week. - No vaginal discharge. - Urinalysis normal. - Prefers not to do exam today.  Adverse reaction to vitamin d  supplementation - Recent use of high-dose vitamin D  for low levels resulted in sore tongue. - Vitamin D  discontinued one month ago; tongue soreness resolved.       06/19/2024   10:58 AM 02/12/2024    8:42 AM 11/13/2023    8:13 AM 08/01/2023    1:32 PM 04/30/2023    1:29 PM  Depression screen PHQ 2/9  Decreased Interest 0 0  1 1  Down, Depressed, Hopeless 0 0 0 1 0  PHQ - 2 Score 0 0 0 2 1  Altered sleeping  1 2 1 2   Tired, decreased energy  1 1 1 1   Change in appetite  0 0 1 2  Feeling bad or failure about yourself   0 0 0 0  Trouble concentrating  0 0 0 1  Moving slowly or fidgety/restless  0 0 0 0  Suicidal thoughts  0 0 0 0  PHQ-9 Score  2  3  5  7    Difficult doing work/chores  Not difficult at all Not difficult at all Somewhat difficult Not difficult at all     Data saved with a previous flowsheet row definition        06/19/2024   10:59 AM  Fall Risk   Falls in the past year? 0  Number falls in past yr: 0  Injury with Fall? 0   Risk for fall due to : No Fall Risks;History of fall(s)  Follow up Falls evaluation completed  Data saved with a previous flowsheet row definition    Patient Care Team: Sherre Clapper, MD as PCP - General (Family Medicine) Charlanne Groom, MD as Consulting Physician (Gastroenterology) Mat Browning, MD as Consulting Physician (Obstetrics and Gynecology) Raylene Debby MATSU., MD as Referring Physician (Cardiology)   Review of Systems  Constitutional:  Positive for fatigue. Negative for chills and fever.  HENT:  Negative for congestion, ear pain and sore throat.   Respiratory:  Positive for shortness of breath. Negative for cough.   Cardiovascular:  Positive for palpitations.  Negative for chest pain.  Gastrointestinal:  Negative for abdominal pain, constipation, diarrhea, nausea and vomiting.  Genitourinary:  Negative for dysuria and urgency.  Musculoskeletal:  Positive for back pain. Negative for arthralgias and myalgias.  Skin:  Negative for rash.  Neurological:  Negative for dizziness and headaches.  Psychiatric/Behavioral:  Negative for dysphoric mood. The patient is not nervous/anxious.     Current Outpatient Medications on File Prior to Visit  Medication Sig Dispense Refill   aspirin EC 81 MG tablet Take 81 mg by mouth daily. Swallow whole.     Biotin 10 MG CAPS Take 10 mg by mouth daily.     calcium -vitamin D  (OSCAL WITH D) 500-200 MG-UNIT TABS tablet Take by mouth.     clotrimazole-betamethasone (LOTRISONE) cream Apply 1 Application topically 2 (two) times daily as needed.     cyanocobalamin  1000 MCG tablet Take 1,000 mcg by mouth daily.     dicyclomine  (BENTYL ) 10 MG capsule Take 1 capsule (10 mg total) by mouth 3 (three) times daily before meals. As needed (Patient taking differently: Take 10 mg by mouth 3 (three) times daily as needed for spasms. As needed) 90 capsule 8   diltiazem  (CARDIZEM  CD) 120 MG 24 hr capsule Take 1 capsule (120 mg total) by mouth daily. 90 capsule 1   metoCLOPramide  (REGLAN ) 5 MG tablet Take 1 tablet (5 mg total) by mouth every 8 (eight) hours as needed for nausea or vomiting. 60 tablet 1   Omega-3 Fatty Acids (FISH OIL PO) Take 1 tablet by mouth daily at 6 (six) AM.     ondansetron  (ZOFRAN ) 4 MG tablet Take 1 tablet (4 mg total) by mouth every 8 (eight) hours as needed for nausea or vomiting. 20 tablet 4   pyridoxine (B-6) 100 MG tablet Take 100 mg by mouth daily.     No current facility-administered medications on file prior to visit.   Past Medical History:  Diagnosis Date   Allergy    Anemia    Aneurysm of ascending aorta without rupture    Anxiety    Aortic atherosclerosis    Arthritis    Cataract    Depression     Family history of colon cancer    Fibromyalgia    GERD (gastroesophageal reflux disease)    Hyperlipidemia    IBS (irritable bowel syndrome)    Moderate recurrent major depression (HCC) 08/05/2021   OSA (obstructive sleep apnea)    Rectal prolapse    Stomach ulcer    Tick bite    took antibiotics memorial day weekend 2021 on back   Past Surgical History:  Procedure Laterality Date   APPENDECTOMY     CESAREAN SECTION     CHOLECYSTECTOMY     COLONOSCOPY  04/18/2014   Diverticulosis.    ESOPHAGOGASTRODUODENOSCOPY  04/14/2016   Schatzkis ring. Hiatal hernia. Gastritis.    HERNIA REPAIR  11/07/2021   Hiatal   LIPOMA EXCISION  NISSEN FUNDOPLICATION  11/07/2021   [facility]   thermal ablation     TONSILLECTOMY      Family History  Problem Relation Age of Onset   Colon cancer Mother    Colon cancer Father    Liver cancer Father    CAD Father    Hypertension Sister    Hyperlipidemia Brother    Gout Brother    Cancer Maternal Aunt        female cancer, not breast   Cancer Paternal Aunt        female cancer, not breast   Diabetes Maternal Grandmother    Diabetes Paternal Grandmother    Heart attack Other    Cancer Other        Leiomyosarcome and Liver   Migraines Other    Colon polyps Neg Hx    Esophageal cancer Neg Hx    Pancreatic cancer Neg Hx    Rectal cancer Neg Hx    Stomach cancer Neg Hx    Social History   Socioeconomic History   Marital status: Widowed    Spouse name: Not on file   Number of children: 2   Years of education: Not on file   Highest education level: 12th grade  Occupational History   Not on file  Tobacco Use   Smoking status: Never   Smokeless tobacco: Never  Vaping Use   Vaping status: Never Used  Substance and Sexual Activity   Alcohol use: Never   Drug use: Never   Sexual activity: Not Currently  Other Topics Concern   Not on file  Social History Narrative   Not on file   Social  Drivers of Health   Tobacco Use: Low Risk (09/04/2024)   Patient History    Smoking Tobacco Use: Never    Smokeless Tobacco Use: Never    Passive Exposure: Not on file  Financial Resource Strain: Low Risk (09/03/2024)   Overall Financial Resource Strain (CARDIA)    Difficulty of Paying Living Expenses: Not hard at all  Food Insecurity: No Food Insecurity (09/03/2024)   Epic    Worried About Programme Researcher, Broadcasting/film/video in the Last Year: Never true    Ran Out of Food in the Last Year: Never true  Transportation Needs: No Transportation Needs (09/04/2024)   Epic    Lack of Transportation (Medical): No    Lack of Transportation (Non-Medical): No  Physical Activity: Unknown (09/03/2024)   Exercise Vital Sign    Days of Exercise per Week: 2 days    Minutes of Exercise per Session: Not on file  Recent Concern: Physical Activity - Inactive (06/19/2024)   Exercise Vital Sign    Days of Exercise per Week: 0 days    Minutes of Exercise per Session: 0 min  Stress: No Stress Concern Present (09/03/2024)   [employer] of Occupational Health - Occupational Stress Questionnaire    Feeling of Stress: Only a little  Social Connections: Moderately Integrated (09/03/2024)   Social Connection and Isolation Panel    Frequency of Communication with Friends and Family: More than three times a week    Frequency of Social Gatherings with Friends and Family: Three times a week    Attends Religious Services: More than 4 times per year    Active Member of Clubs or Organizations: Yes    Attends Banker Meetings: More than 4 times per year    Marital Status: Widowed  Depression (PHQ2-9): Low Risk (  06/19/2024)   Depression (PHQ2-9)    PHQ-2 Score: 0  Alcohol Screen: Low Risk (06/19/2024)   Alcohol Screen    Last Alcohol Screening Score (AUDIT): 0  Housing: Low Risk (09/03/2024)   Epic    Unable to Pay for Housing in the Last Year: No    Number of Times Moved in the Last Year: 0    Homeless in  the Last Year: No  Utilities: Not At Risk (06/19/2024)   Epic    Threatened with loss of utilities: No  Health Literacy: Adequate Health Literacy (06/19/2024)   B1300 Health Literacy    Frequency of need for help with medical instructions: Never    Objective:  BP 120/68   Pulse 72   Temp 97.9 F (36.6 C)   Ht 5' 6 (1.676 m)   Wt 213 lb (96.6 kg)   SpO2 96%   BMI 34.38 kg/m      09/04/2024    8:47 AM 08/02/2024   11:38 AM 06/24/2024    9:09 AM  BP/Weight  Systolic BP 120 117 124  Diastolic BP 68 75 78  Wt. (Lbs) 213  210  BMI 34.38 kg/m2  33.89 kg/m2    Physical Exam Vitals reviewed.  Constitutional:      Appearance: Normal appearance. She is obese.  HENT:     Right Ear: Tympanic membrane, ear canal and external ear normal.     Left Ear: Tympanic membrane, ear canal and external ear normal.     Nose: Nose normal.     Mouth/Throat:     Pharynx: Oropharynx is clear.  Neck:     Vascular: No carotid bruit.  Cardiovascular:     Rate and Rhythm: Normal rate and regular rhythm.     Heart sounds: Normal heart sounds. No murmur heard. Pulmonary:     Effort: Pulmonary effort is normal. No respiratory distress.     Breath sounds: Normal breath sounds.  Abdominal:     General: Abdomen is flat. Bowel sounds are normal.     Palpations: Abdomen is soft.     Tenderness: There is no abdominal tenderness.  Musculoskeletal:        General: Tenderness (lumbar.) present.     Cervical back: No tenderness.  Lymphadenopathy:     Cervical: No cervical adenopathy.  Neurological:     Mental Status: She is alert and oriented to person, place, and time.  Psychiatric:        Mood and Affect: Mood normal.        Behavior: Behavior normal.         Lab Results  Component Value Date   WBC 5.1 09/05/2024   HGB 14.5 09/05/2024   HCT 42.3 09/05/2024   PLT 243 09/05/2024   GLUCOSE 77 09/05/2024   CHOL 196 02/12/2024   TRIG 108 02/12/2024   HDL 55 02/12/2024   LDLCALC 122 (H)  02/12/2024   ALT 14 09/05/2024   AST 17 09/05/2024   NA 141 09/05/2024   K 4.4 09/05/2024   CL 102 09/05/2024   CREATININE 0.82 09/05/2024   BUN 13 09/05/2024   CO2 23 09/05/2024   TSH 1.720 09/05/2024   HGBA1C 5.6 09/04/2024    Results for orders placed or performed in visit on 09/04/24  POCT glycosylated hemoglobin (Hb A1C)   Collection Time: 09/04/24  8:58 AM  Result Value Ref Range   Hemoglobin A1C     HbA1c POC (<> result, manual entry) 5.6 4.0 - 5.6 %  HbA1c, POC (prediabetic range)     HbA1c, POC (controlled diabetic range)    POCT Lipid Panel   Collection Time: 09/04/24  9:01 AM  Result Value Ref Range   TC 234    HDL 19    TRG 149    LDL 186    Non-HDL 216    TC/HDL    POCT URINALYSIS DIP (CLINITEK)   Collection Time: 09/04/24  9:06 AM  Result Value Ref Range   Color, UA yellow yellow   Clarity, UA clear clear   Glucose, UA negative negative mg/dL   Bilirubin, UA negative negative   Ketones, POC UA negative negative mg/dL   Spec Grav, UA 8.989 8.989 - 1.025   Blood, UA negative negative   pH, UA 7.5 5.0 - 8.0   POC PROTEIN,UA negative negative, trace   Urobilinogen, UA 0.2 0.2 or 1.0 E.U./dL   Nitrite, UA Negative Negative   Leukocytes, UA Negative Negative  VITAMIN D  25 Hydroxy (Vit-D Deficiency, Fractures)   Collection Time: 09/05/24  8:39 AM  Result Value Ref Range   Vit D, 25-Hydroxy 22.6 (L) 30.0 - 100.0 ng/mL  CBC with Differential/Platelet   Collection Time: 09/05/24  8:39 AM  Result Value Ref Range   WBC 5.1 3.4 - 10.8 x10E3/uL   RBC 4.55 3.77 - 5.28 x10E6/uL   Hemoglobin 14.5 11.1 - 15.9 g/dL   Hematocrit 57.6 65.9 - 46.6 %   MCV 93 79 - 97 fL   MCH 31.9 26.6 - 33.0 pg   MCHC 34.3 31.5 - 35.7 g/dL   RDW 87.8 88.2 - 84.5 %   Platelets 243 150 - 450 x10E3/uL   Neutrophils 65 Not Estab. %   Lymphs 24 Not Estab. %   Monocytes 8 Not Estab. %   Eos 2 Not Estab. %   Basos 1 Not Estab. %   Neutrophils Absolute 3.3 1.4 - 7.0 x10E3/uL    Lymphocytes Absolute 1.2 0.7 - 3.1 x10E3/uL   Monocytes Absolute 0.4 0.1 - 0.9 x10E3/uL   EOS (ABSOLUTE) 0.1 0.0 - 0.4 x10E3/uL   Basophils Absolute 0.0 0.0 - 0.2 x10E3/uL   Immature Granulocytes 0 Not Estab. %   Immature Grans (Abs) 0.0 0.0 - 0.1 x10E3/uL  Comprehensive metabolic panel with GFR   Collection Time: 09/05/24  8:39 AM  Result Value Ref Range   Glucose 77 70 - 99 mg/dL   BUN 13 8 - 27 mg/dL   Creatinine, Ser 9.17 0.57 - 1.00 mg/dL   eGFR 75 >40 fO/fpw/8.26   BUN/Creatinine Ratio 16 12 - 28   Sodium 141 134 - 144 mmol/L   Potassium 4.4 3.5 - 5.2 mmol/L   Chloride 102 96 - 106 mmol/L   CO2 23 20 - 29 mmol/L   Calcium  9.2 8.7 - 10.3 mg/dL   Total Protein 6.5 6.0 - 8.5 g/dL   Albumin 4.3 3.8 - 4.8 g/dL   Globulin, Total 2.2 1.5 - 4.5 g/dL   Bilirubin Total 0.5 0.0 - 1.2 mg/dL   Alkaline Phosphatase 120 49 - 135 IU/L   AST 17 0 - 40 IU/L   ALT 14 0 - 32 IU/L  TSH   Collection Time: 09/05/24  8:39 AM  Result Value Ref Range   TSH 1.720 0.450 - 4.500 uIU/mL  .  Assessment & Plan:   Assessment & Plan Mixed hyperlipidemia LDL cholesterol increased from 122 to 186 mg/dL. Previous statin intolerance due to muscle pain. No current medication. - Initiated Zetia  for  cholesterol management. - Recheck lipid panel in 3 months. Orders:   POCT Lipid Panel  Prediabetes A1c improved from 5.9% to 5.6%, indicating better glycemic control. - Continue monitoring A1c levels. Orders:   POCT glycosylated hemoglobin (Hb A1C)  Gastroesophageal reflux disease with esophagitis without hemorrhage GERD symptoms with history of gastric ulcer. Protonix  causing diarrhea. Insurance issues with acid fix. - Switch from Protonix  to Nexium  40 mg twice daily.   Orders:   esomeprazole  (NEXIUM ) 40 MG capsule; Take 1 capsule (40 mg total) by mouth 2 (two) times daily before a meal.   Dysuria Check UA  Orders:   POCT URINALYSIS DIP (CLINITEK)  Other fatigue Recent onset possibly related to  decreased physical activity due to back pain. No recent stress tests or echocardiograms. - Ordered chest x-ray to evaluate underlying causes. Orders:   CBC with Differential/Platelet   Comprehensive metabolic panel with GFR   TSH  Vitamin D  insufficiency Previous high-dose vitamin D  caused tongue soreness. Off vitamin D  for a month with symptom resolution. - Checked vitamin D  level. - Consider over-the-counter vitamin D  supplementation if levels are low. Orders:   VITAMIN D  25 Hydroxy (Vit-D Deficiency, Fractures)  SOB (shortness of breath) Recent onset possibly related to decreased physical activity due to back pain. No recent stress tests or echocardiograms. - Ordered chest x-ray to evaluate underlying causes.  Orders:   DG Chest 2 View; Future  SVT (supraventricular tachycardia) Intermittent palpitations and racing heart. Previous heart monitor evaluation. No current medication. Considering flecainide if symptoms persist. - Monitor symptoms and consider flecainide if palpitations persist. - Refer to Dr. Sheena (cardiology)    Sciatica of right side Chronic right-sided sciatica due to multiple pinched nerves. Previous injections provided temporary relief. Neurosurgical consultation suggested no clear surgical intervention. Considering surgical options, including fusion, post-cataract surgery. - Recommended patient call Dr. Burnetta for surgical consultation regarding potential fusion surgery. - Consider second opinion from [facility] if desired.    Degeneration of intervertebral disc of lumbar region with discogenic back pain and lower extremity pain Chronic right-sided sciatica due to multiple pinched nerves. Previous injections provided temporary relief. Neurosurgical consultation suggested no clear surgical intervention. Considering surgical options, including fusion, post-cataract surgery. - see above.    Need for vaccination  Orders:   Pneumococcal conjugate vaccine 20-valent  (Prevnar 20)   Body mass index is 34.38 kg/m.   Meds ordered this encounter  Medications   esomeprazole  (NEXIUM ) 40 MG capsule    Sig: Take 1 capsule (40 mg total) by mouth 2 (two) times daily before a meal.    Dispense:  180 capsule    Refill:  0   ezetimibe  (ZETIA ) 10 MG tablet    Sig: Take 1 tablet (10 mg total) by mouth daily.    Dispense:  90 tablet    Refill:  0    Orders Placed This Encounter  Procedures   DG Chest 2 View   Pneumococcal conjugate vaccine 20-valent (Prevnar 20)   VITAMIN D  25 Hydroxy (Vit-D Deficiency, Fractures)   CBC with Differential/Platelet   Comprehensive metabolic panel with GFR   TSH   POCT glycosylated hemoglobin (Hb A1C)   POCT Lipid Panel   POCT URINALYSIS DIP (CLINITEK)    Total time spent on today's visit was 40 minutes, including both face-to-face time and nonface-to-face time personally spent on review of chart (labs and imaging), discussing labs and goals, discussing further work-up, treatment options, referrals to specialist if needed, reviewing outside records of pertinent, answering patient's  questions, and coordinating care.  LILLETTE Kato I Leal-Borjas,acting as a scribe for Abigail Free, MD.,have documented all relevant documentation on the behalf of Abigail Free, MD,as directed by  Abigail Free, MD while in the presence of Abigail Free, MD.   Follow-up: Return in about 3 months (around 12/03/2024) for chronic fasting.  An After Visit Summary was printed and given to the patient.  I attest that I have reviewed this visit and agree with the plan scribed by my staff.   Abigail Free, MD [facility] [phone]

## 2024-09-03 NOTE — Assessment & Plan Note (Addendum)
 A1c improved from 5.9% to 5.6%, indicating better glycemic control. - Continue monitoring A1c levels. Orders:   POCT glycosylated hemoglobin (Hb A1C)

## 2024-09-03 NOTE — Assessment & Plan Note (Addendum)
°  Orders:   POCT Lipid Panel

## 2024-09-04 ENCOUNTER — Ambulatory Visit (HOSPITAL_BASED_OUTPATIENT_CLINIC_OR_DEPARTMENT_OTHER)
Admission: RE | Admit: 2024-09-04 | Discharge: 2024-09-04 | Disposition: A | Discharge status: Home / Self Care | Source: Ambulatory Visit | Attending: Family Medicine | Admitting: Family Medicine

## 2024-09-04 ENCOUNTER — Encounter: Payer: Self-pay | Admitting: Family Medicine

## 2024-09-04 ENCOUNTER — Ambulatory Visit: Admitting: Family Medicine

## 2024-09-04 ENCOUNTER — Ambulatory Visit: Payer: Self-pay | Admitting: Family Medicine

## 2024-09-04 VITALS — BP 120/68 | HR 72 | Temp 97.9°F | Ht 66.0 in | Wt 213.0 lb

## 2024-09-04 DIAGNOSIS — R300 Dysuria: Secondary | ICD-10-CM

## 2024-09-04 DIAGNOSIS — E782 Mixed hyperlipidemia: Secondary | ICD-10-CM

## 2024-09-04 DIAGNOSIS — M5431 Sciatica, right side: Secondary | ICD-10-CM

## 2024-09-04 DIAGNOSIS — R5383 Other fatigue: Secondary | ICD-10-CM | POA: Diagnosis not present

## 2024-09-04 DIAGNOSIS — E559 Vitamin D deficiency, unspecified: Secondary | ICD-10-CM | POA: Diagnosis not present

## 2024-09-04 DIAGNOSIS — Z23 Encounter for immunization: Secondary | ICD-10-CM | POA: Diagnosis not present

## 2024-09-04 DIAGNOSIS — R0602 Shortness of breath: Secondary | ICD-10-CM

## 2024-09-04 DIAGNOSIS — R7303 Prediabetes: Secondary | ICD-10-CM

## 2024-09-04 DIAGNOSIS — I471 Supraventricular tachycardia, unspecified: Secondary | ICD-10-CM | POA: Diagnosis not present

## 2024-09-04 DIAGNOSIS — K2100 Gastro-esophageal reflux disease with esophagitis, without bleeding: Secondary | ICD-10-CM | POA: Diagnosis not present

## 2024-09-04 DIAGNOSIS — M51362 Other intervertebral disc degeneration, lumbar region with discogenic back pain and lower extremity pain: Secondary | ICD-10-CM | POA: Diagnosis not present

## 2024-09-04 LAB — POCT URINALYSIS DIP (CLINITEK)
Bilirubin, UA: NEGATIVE
Blood, UA: NEGATIVE
Glucose, UA: NEGATIVE mg/dL
Ketones, POC UA: NEGATIVE mg/dL
Leukocytes, UA: NEGATIVE
Nitrite, UA: NEGATIVE
POC PROTEIN,UA: NEGATIVE
Spec Grav, UA: 1.01 (ref 1.010–1.025)
Urobilinogen, UA: 0.2 U/dL
pH, UA: 7.5 (ref 5.0–8.0)

## 2024-09-04 LAB — POCT LIPID PANEL
HDL: 19
LDL: 186
Non-HDL: 216
TC: 234
TRG: 149

## 2024-09-04 LAB — POCT GLYCOSYLATED HEMOGLOBIN (HGB A1C): HbA1c POC (<> result, manual entry): 5.6 % (ref 4.0–5.6)

## 2024-09-04 MED ORDER — EZETIMIBE 10 MG PO TABS: 10.0000 mg | ORAL_TABLET | Freq: Every day | ORAL | 0 refills | Status: AC

## 2024-09-04 MED ORDER — ESOMEPRAZOLE MAGNESIUM 40 MG PO CPDR: 40.0000 mg | DELAYED_RELEASE_CAPSULE | Freq: Two times a day (BID) | ORAL | 0 refills | Status: AC

## 2024-09-04 NOTE — Patient Instructions (Signed)
°  VISIT SUMMARY: Today, we discussed your ongoing issues with chronic back pain, sciatica, and other health concerns. We reviewed your symptoms, previous treatments, and made plans for further evaluation and management.  YOUR PLAN: LUMBOSACRAL RADICULOPATHY WITH RIGHT-SIDED SCIATICA: You have chronic right-sided sciatica due to multiple pinched nerves, causing persistent leg pain. -Follow up with  Dr. Burnetta for a surgical consultation regarding potential fusion surgery.  MIXED HYPERLIPIDEMIA: Your LDL cholesterol has increased, and you have had muscle pain with statins in the past. -Start taking Zetia  for cholesterol management. -We will recheck your lipid panel in 3 months.  SUPRAVENTRICULAR TACHYCARDIA: You have intermittent palpitations and a racing heart. -Refer to Dr. Sheena (cardiology)  SHORTNESS OF BREATH AND FATIGUE: You have been experiencing shortness of breath and fatigue, possibly related to decreased physical activity due to back pain. -A chest x-ray has been ordered to evaluate the underlying causes.  VITAMIN D  DEFICIENCY: You had a sore tongue from high-dose vitamin D , which resolved after stopping the supplement. -We checked your vitamin D  level. -Consider taking over-the-counter vitamin D  supplements if your levels are low.  PREDIABETES: Your A1c has improved, indicating better blood sugar control. -Continue monitoring your A1c levels.                      Contains text generated by [employer].                                 Contains text generated by [employer].

## 2024-09-04 NOTE — Assessment & Plan Note (Addendum)
 SABRA

## 2024-09-05 ENCOUNTER — Other Ambulatory Visit

## 2024-09-06 DIAGNOSIS — R5383 Other fatigue: Secondary | ICD-10-CM | POA: Insufficient documentation

## 2024-09-06 DIAGNOSIS — M51362 Other intervertebral disc degeneration, lumbar region with discogenic back pain and lower extremity pain: Secondary | ICD-10-CM | POA: Insufficient documentation

## 2024-09-06 LAB — COMPREHENSIVE METABOLIC PANEL WITH GFR
ALT: 14 IU/L (ref 0–32)
AST: 17 IU/L (ref 0–40)
Albumin: 4.3 g/dL (ref 3.8–4.8)
Alkaline Phosphatase: 120 IU/L (ref 49–135)
BUN/Creatinine Ratio: 16 (ref 12–28)
BUN: 13 mg/dL (ref 8–27)
Bilirubin Total: 0.5 mg/dL (ref 0.0–1.2)
CO2: 23 mmol/L (ref 20–29)
Calcium: 9.2 mg/dL (ref 8.7–10.3)
Chloride: 102 mmol/L (ref 96–106)
Creatinine, Ser: 0.82 mg/dL (ref 0.57–1.00)
Globulin, Total: 2.2 g/dL (ref 1.5–4.5)
Glucose: 77 mg/dL (ref 70–99)
Potassium: 4.4 mmol/L (ref 3.5–5.2)
Sodium: 141 mmol/L (ref 134–144)
Total Protein: 6.5 g/dL (ref 6.0–8.5)
eGFR: 75 mL/min/1.73 (ref >59)

## 2024-09-06 LAB — CBC WITH DIFFERENTIAL/PLATELET
Basophils Absolute: 0 x10E3/uL (ref 0.0–0.2)
Basos: 1 %
EOS (ABSOLUTE): 0.1 x10E3/uL (ref 0.0–0.4)
Eos: 2 %
Hematocrit: 42.3 % (ref 34.0–46.6)
Hemoglobin: 14.5 g/dL (ref 11.1–15.9)
Immature Grans (Abs): 0 x10E3/uL (ref 0.0–0.1)
Immature Granulocytes: 0 %
Lymphocytes Absolute: 1.2 x10E3/uL (ref 0.7–3.1)
Lymphs: 24 %
MCH: 31.9 pg (ref 26.6–33.0)
MCHC: 34.3 g/dL (ref 31.5–35.7)
MCV: 93 fL (ref 79–97)
Monocytes Absolute: 0.4 x10E3/uL (ref 0.1–0.9)
Monocytes: 8 %
Neutrophils Absolute: 3.3 x10E3/uL (ref 1.4–7.0)
Neutrophils: 65 %
Platelets: 243 x10E3/uL (ref 150–450)
RBC: 4.55 x10E6/uL (ref 3.77–5.28)
RDW: 12.1 % (ref 11.7–15.4)
WBC: 5.1 x10E3/uL (ref 3.4–10.8)

## 2024-09-06 LAB — TSH: TSH: 1.72 u[IU]/mL (ref 0.450–4.500)

## 2024-09-06 LAB — VITAMIN D 25 HYDROXY (VIT D DEFICIENCY, FRACTURES): Vit D, 25-Hydroxy: 22.6 ng/mL — ABNORMAL LOW (ref 30.0–100.0)

## 2024-09-06 NOTE — Assessment & Plan Note (Signed)
 Chronic right-sided sciatica due to multiple pinched nerves. Previous injections provided temporary relief. Neurosurgical consultation suggested no clear surgical intervention. Considering surgical options, including fusion, post-cataract surgery. - Referred to Dr. Burnetta for surgical consultation regarding potential fusion surgery. - Consider second opinion from [facility] group if desired.

## 2024-09-06 NOTE — Assessment & Plan Note (Addendum)
°  Orders:   POCT URINALYSIS DIP (CLINITEK)

## 2024-09-06 NOTE — Assessment & Plan Note (Signed)
 Previous high-dose vitamin D  caused tongue soreness. Off vitamin D  for a month with symptom resolution. - Checked vitamin D  level. - Consider over-the-counter vitamin D  supplementation if levels are low. Orders:   VITAMIN D  25 Hydroxy (Vit-D Deficiency, Fractures)

## 2024-09-06 NOTE — Assessment & Plan Note (Signed)
 Recent onset possibly related to decreased physical activity due to back pain. No recent stress tests or echocardiograms. - Ordered chest x-ray to evaluate underlying causes. Orders:   DG Chest 2 View; Future

## 2024-09-06 NOTE — Assessment & Plan Note (Signed)
 Intermittent palpitations and racing heart. Previous heart monitor evaluation. No current medication. Considering flecainide if symptoms persist. - Monitor symptoms and consider flecainide if palpitations persist. - Refer to Dr. Sheena (cardiology)

## 2024-09-06 NOTE — Assessment & Plan Note (Signed)
 Recent onset possibly related to decreased physical activity due to back pain. No recent stress tests or echocardiograms. - Ordered chest x-ray to evaluate underlying causes. Orders:   CBC with Differential/Platelet   Comprehensive metabolic panel with GFR   TSH

## 2024-09-08 ENCOUNTER — Other Ambulatory Visit: Payer: Self-pay | Admitting: Physician Assistant

## 2024-09-10 ENCOUNTER — Encounter: Payer: Self-pay | Admitting: Family Medicine

## 2024-09-10 NOTE — Telephone Encounter (Signed)
 Spoke with Glade from [facility] radiology and she stated they will move x-ray up to STAT and get it read today

## 2024-09-29 ENCOUNTER — Inpatient Hospital Stay (HOSPITAL_BASED_OUTPATIENT_CLINIC_OR_DEPARTMENT_OTHER): Admission: RE | Admit: 2024-09-29 | Source: Ambulatory Visit | Admitting: Radiology

## 2024-09-30 ENCOUNTER — Ambulatory Visit (INDEPENDENT_AMBULATORY_CARE_PROVIDER_SITE_OTHER)
Admission: RE | Admit: 2024-09-30 | Discharge: 2024-09-30 | Disposition: A | Discharge status: Home / Self Care | Source: Ambulatory Visit | Attending: Physician Assistant | Admitting: Physician Assistant

## 2024-09-30 DIAGNOSIS — Z1231 Encounter for screening mammogram for malignant neoplasm of breast: Secondary | ICD-10-CM | POA: Diagnosis not present

## 2024-12-05 ENCOUNTER — Ambulatory Visit: Admitting: Family Medicine

## 2025-01-01 ENCOUNTER — Ambulatory Visit: Admitting: Family Medicine

## 2025-01-20 ENCOUNTER — Ambulatory Visit: Admitting: Cardiology

## 2025-07-01 ENCOUNTER — Ambulatory Visit
